# Patient Record
Sex: Female | Born: 1950
Health system: Southern US, Community
[De-identification: ages and names within clinical notes are randomized; demographics above are authoritative.]

## PROBLEM LIST (undated history)

## (undated) DIAGNOSIS — G930 Cerebral cysts: Secondary | ICD-10-CM

## (undated) DIAGNOSIS — E039 Hypothyroidism, unspecified: Secondary | ICD-10-CM

## (undated) DIAGNOSIS — R42 Dizziness and giddiness: Secondary | ICD-10-CM

## (undated) DIAGNOSIS — H532 Diplopia: Secondary | ICD-10-CM

## (undated) DIAGNOSIS — C4490 Unspecified malignant neoplasm of skin, unspecified: Secondary | ICD-10-CM

## (undated) DIAGNOSIS — Z8719 Personal history of other diseases of the digestive system: Secondary | ICD-10-CM

## (undated) DIAGNOSIS — T8859XA Other complications of anesthesia, initial encounter: Secondary | ICD-10-CM

## (undated) DIAGNOSIS — I10 Essential (primary) hypertension: Secondary | ICD-10-CM

## (undated) DIAGNOSIS — T7840XA Allergy, unspecified, initial encounter: Secondary | ICD-10-CM

## (undated) DIAGNOSIS — R51 Headache: Secondary | ICD-10-CM

## (undated) DIAGNOSIS — K219 Gastro-esophageal reflux disease without esophagitis: Secondary | ICD-10-CM

## (undated) DIAGNOSIS — M1990 Unspecified osteoarthritis, unspecified site: Secondary | ICD-10-CM

## (undated) DIAGNOSIS — C801 Malignant (primary) neoplasm, unspecified: Secondary | ICD-10-CM

## (undated) DIAGNOSIS — T4145XA Adverse effect of unspecified anesthetic, initial encounter: Secondary | ICD-10-CM

## (undated) DIAGNOSIS — E079 Disorder of thyroid, unspecified: Secondary | ICD-10-CM

## (undated) DIAGNOSIS — K635 Polyp of colon: Secondary | ICD-10-CM

## (undated) DIAGNOSIS — R519 Headache, unspecified: Secondary | ICD-10-CM

## (undated) DIAGNOSIS — C4491 Basal cell carcinoma of skin, unspecified: Secondary | ICD-10-CM

## (undated) DIAGNOSIS — K859 Acute pancreatitis without necrosis or infection, unspecified: Secondary | ICD-10-CM

## (undated) DIAGNOSIS — N809 Endometriosis, unspecified: Secondary | ICD-10-CM

## (undated) DIAGNOSIS — E785 Hyperlipidemia, unspecified: Secondary | ICD-10-CM

## (undated) HISTORY — DX: Hyperlipidemia, unspecified: E78.5

## (undated) HISTORY — DX: Personal history of other diseases of the digestive system: Z87.19

## (undated) HISTORY — DX: Disorder of thyroid, unspecified: E07.9

## (undated) HISTORY — PX: OOPHORECTOMY: SHX86

## (undated) HISTORY — DX: Unspecified malignant neoplasm of skin, unspecified: C44.90

## (undated) HISTORY — DX: Basal cell carcinoma of skin, unspecified: C44.91

## (undated) HISTORY — PX: COLONOSCOPY: SHX174

## (undated) HISTORY — DX: Gastro-esophageal reflux disease without esophagitis: K21.9

## (undated) HISTORY — DX: Essential (primary) hypertension: I10

## (undated) HISTORY — PX: DIAGNOSTIC LAPAROSCOPY: SUR761

## (undated) HISTORY — DX: Allergy, unspecified, initial encounter: T78.40XA

## (undated) HISTORY — DX: Diplopia: H53.2

## (undated) HISTORY — DX: Cerebral cysts: G93.0

## (undated) HISTORY — DX: Polyp of colon: K63.5

## (undated) HISTORY — DX: Acute pancreatitis without necrosis or infection, unspecified: K85.90

---

## 1999-11-10 ENCOUNTER — Encounter: Payer: Self-pay | Admitting: Internal Medicine

## 1999-11-10 ENCOUNTER — Encounter: Admission: RE | Admit: 1999-11-10 | Discharge: 1999-11-10 | Payer: Self-pay | Admitting: Internal Medicine

## 2000-09-28 ENCOUNTER — Other Ambulatory Visit: Admission: RE | Admit: 2000-09-28 | Discharge: 2000-09-28 | Payer: Self-pay | Admitting: Obstetrics & Gynecology

## 2001-10-24 ENCOUNTER — Other Ambulatory Visit: Admission: RE | Admit: 2001-10-24 | Discharge: 2001-10-24 | Payer: Self-pay | Admitting: Internal Medicine

## 2001-11-15 ENCOUNTER — Encounter: Payer: Self-pay | Admitting: Internal Medicine

## 2001-11-15 ENCOUNTER — Encounter: Admission: RE | Admit: 2001-11-15 | Discharge: 2001-11-15 | Payer: Self-pay | Admitting: Internal Medicine

## 2002-01-27 ENCOUNTER — Ambulatory Visit (HOSPITAL_COMMUNITY): Admission: RE | Admit: 2002-01-27 | Discharge: 2002-01-27 | Payer: Self-pay | Admitting: *Deleted

## 2002-01-27 ENCOUNTER — Encounter (INDEPENDENT_AMBULATORY_CARE_PROVIDER_SITE_OTHER): Payer: Self-pay

## 2004-01-13 ENCOUNTER — Other Ambulatory Visit: Admission: RE | Admit: 2004-01-13 | Discharge: 2004-01-13 | Payer: Self-pay | Admitting: Obstetrics & Gynecology

## 2004-04-29 ENCOUNTER — Encounter: Admission: RE | Admit: 2004-04-29 | Discharge: 2004-04-29 | Payer: Self-pay | Admitting: Internal Medicine

## 2005-05-08 ENCOUNTER — Other Ambulatory Visit: Admission: RE | Admit: 2005-05-08 | Discharge: 2005-05-08 | Payer: Self-pay | Admitting: Obstetrics & Gynecology

## 2005-09-27 ENCOUNTER — Encounter: Admission: RE | Admit: 2005-09-27 | Discharge: 2005-09-27 | Payer: Self-pay | Admitting: Internal Medicine

## 2006-05-22 ENCOUNTER — Encounter: Admission: RE | Admit: 2006-05-22 | Discharge: 2006-05-22 | Payer: Self-pay | Admitting: Obstetrics & Gynecology

## 2008-05-12 ENCOUNTER — Encounter: Admission: RE | Admit: 2008-05-12 | Discharge: 2008-05-12 | Payer: Self-pay | Admitting: Internal Medicine

## 2008-05-18 ENCOUNTER — Encounter: Admission: RE | Admit: 2008-05-18 | Discharge: 2008-05-18 | Payer: Self-pay | Admitting: Obstetrics & Gynecology

## 2009-05-26 ENCOUNTER — Encounter: Admission: RE | Admit: 2009-05-26 | Discharge: 2009-05-26 | Payer: Self-pay | Admitting: Internal Medicine

## 2010-04-21 ENCOUNTER — Ambulatory Visit (HOSPITAL_COMMUNITY)
Admission: RE | Admit: 2010-04-21 | Discharge: 2010-04-21 | Disposition: A | Discharge status: Home / Self Care | Payer: Managed Care, Other (non HMO) | Source: Ambulatory Visit | Attending: Internal Medicine | Admitting: Internal Medicine

## 2010-04-21 DIAGNOSIS — R079 Chest pain, unspecified: Secondary | ICD-10-CM | POA: Insufficient documentation

## 2010-06-20 ENCOUNTER — Other Ambulatory Visit: Payer: Self-pay | Admitting: Internal Medicine

## 2010-06-20 DIAGNOSIS — Z1231 Encounter for screening mammogram for malignant neoplasm of breast: Secondary | ICD-10-CM

## 2010-07-04 ENCOUNTER — Ambulatory Visit
Admission: RE | Admit: 2010-07-04 | Discharge: 2010-07-04 | Disposition: A | Discharge status: Home / Self Care | Payer: Managed Care, Other (non HMO) | Source: Ambulatory Visit | Attending: Internal Medicine | Admitting: Internal Medicine

## 2010-07-04 DIAGNOSIS — Z1231 Encounter for screening mammogram for malignant neoplasm of breast: Secondary | ICD-10-CM

## 2010-08-05 NOTE — Op Note (Signed)
   Abigail Robinson, SCHLAFER                         ACCOUNT NO.:  [id number]   MEDICAL RECORD NO.:  [id number]                   PATIENT TYPE:  AMB   LOCATION:  ENDO                                 FACILITY:  [facility]   PHYSICIAN:  Georgiana Spinner, M.D.                 DATE OF BIRTH:  Dec 11, 1950   DATE OF PROCEDURE:  DATE OF DISCHARGE:                                 OPERATIVE REPORT   PROCEDURE:  Upper endoscopy.   INDICATIONS FOR PROCEDURE:  Gastroesophageal reflux disease.  See previous  note. Patient seen as an outpatient.   ANESTHESIA:  Fentanyl 50 mcg, Versed 4 mg.   DESCRIPTION OF PROCEDURE:  With the patient mildly sedated in the left  lateral decubitus position, the Olympus videoscopic endoscope was inserted  in the mouth and passed under direct vision through the esophagus which  appeared normal until we reached the distal esophagus and there were clear  changes of esophagitis with ulceration and inflammatory changes and a  potentially early stricture formation photographed and biopsies taken. We  entered into the stomach. The fundus, body, antrum, duodenum bulb and second  portion of the duodenum all appeared normal. From this point, the endoscope  was slowly withdrawn taking circumferential views of the duodenal mucosa  until the endoscope was then pulled back out into the stomach, placed in  retroflexion to view the stomach from below and an incomplete wrap of the GE  junction around the endoscope and a hiatal hernia was seen and photographed.  The endoscope was then straightened and withdrawn taking circumferential  views of the remaining gastric and esophageal mucosa. The patient's vital  signs and pulse oximeter remained stable. The patient tolerated the  procedure well without apparent complications.   FINDINGS:  Hiatal hernia with esophagitis and early stricture formation.   PLAN:  Will discuss therapy with patient but should be on a PPI indefinitely  given these  findings. Will proceed to colonoscopy as planned.                                                Georgiana Spinner, M.D.    GMO/MEDQ  D:  01/27/2002  T:  01/27/2002  Job:  [id number]   cc:   Erskine Speed, M.D.  [address]  [place]  [place] [address]  Fax: [phone]

## 2010-08-05 NOTE — Op Note (Signed)
   NAMELADAWNA, Abigail Robinson                         ACCOUNT NO.:  [id number]   MEDICAL RECORD NO.:  [id number]                   PATIENT TYPE:  AMB   LOCATION:  ENDO                                 FACILITY:  [facility]   PHYSICIAN:  Georgiana Spinner, M.D.                 DATE OF BIRTH:  01/01/1951   DATE OF PROCEDURE:  DATE OF DISCHARGE:                                 OPERATIVE REPORT   PROCEDURE:  Colonoscopy.   INDICATIONS FOR PROCEDURE:  Rectal bleeding attributable to hemorrhoids in  the past. See attached clinical note.   ANESTHESIA:  Fentanyl 25 mcg, Versed 2 mg.   DESCRIPTION OF PROCEDURE:  With the patient mildly sedated in the left  lateral decubitus position, the Olympus videoscopic colonoscope was inserted  in the rectum and passed under direct vision to the cecum identified by the  ileocecal valve and appendiceal orifice both of which were photographed.  From this point, the colonoscope was slowly withdrawn taking circumferential  views of the entire colonic mucosa stopping only in the rectum which  appeared normal indirect and showed hemorrhoids on retroflexed view. The  endoscope was straightened and withdrawn. The patient's vital signs and  pulse oximeter remained stable. The patient tolerated the procedure well  without apparent complications.   FINDINGS:  Internal hemorrhoids, otherwise, unremarkable colonoscopic  examination.   PLAN:  See endoscopy note for further details.                                                 Georgiana Spinner, M.D.    GMO/MEDQ  D:  01/27/2002  T:  01/27/2002  Job:  [id number]   cc:   Erskine Speed, M.D.  [address], [address]  [place]  [place] [address]  Fax: [phone]

## 2011-01-26 ENCOUNTER — Ambulatory Visit
Admission: RE | Admit: 2011-01-26 | Discharge: 2011-01-26 | Disposition: A | Discharge status: Home / Self Care | Payer: Managed Care, Other (non HMO) | Source: Ambulatory Visit | Attending: Internal Medicine | Admitting: Internal Medicine

## 2011-01-26 ENCOUNTER — Other Ambulatory Visit: Payer: Self-pay | Admitting: Internal Medicine

## 2011-01-26 DIAGNOSIS — M79641 Pain in right hand: Secondary | ICD-10-CM

## 2011-05-29 ENCOUNTER — Other Ambulatory Visit: Payer: Self-pay | Admitting: Internal Medicine

## 2011-05-29 DIAGNOSIS — Z1231 Encounter for screening mammogram for malignant neoplasm of breast: Secondary | ICD-10-CM

## 2011-06-21 ENCOUNTER — Other Ambulatory Visit (HOSPITAL_COMMUNITY)
Admission: RE | Admit: 2011-06-21 | Discharge: 2011-06-21 | Disposition: A | Discharge status: Home / Self Care | Payer: Private Health Insurance - Indemnity | Source: Ambulatory Visit | Attending: Internal Medicine | Admitting: Internal Medicine

## 2011-06-21 DIAGNOSIS — Z01419 Encounter for gynecological examination (general) (routine) without abnormal findings: Secondary | ICD-10-CM | POA: Insufficient documentation

## 2011-07-28 ENCOUNTER — Ambulatory Visit
Admission: RE | Admit: 2011-07-28 | Discharge: 2011-07-28 | Disposition: A | Discharge status: Home / Self Care | Payer: Private Health Insurance - Indemnity | Source: Ambulatory Visit | Attending: Internal Medicine | Admitting: Internal Medicine

## 2011-07-28 DIAGNOSIS — Z1231 Encounter for screening mammogram for malignant neoplasm of breast: Secondary | ICD-10-CM

## 2011-08-03 ENCOUNTER — Other Ambulatory Visit: Payer: Self-pay | Admitting: Internal Medicine

## 2011-08-03 DIAGNOSIS — N6459 Other signs and symptoms in breast: Secondary | ICD-10-CM

## 2011-08-09 ENCOUNTER — Ambulatory Visit
Admission: RE | Admit: 2011-08-09 | Discharge: 2011-08-09 | Disposition: A | Discharge status: Home / Self Care | Payer: Private Health Insurance - Indemnity | Source: Ambulatory Visit | Attending: Internal Medicine | Admitting: Internal Medicine

## 2011-08-09 DIAGNOSIS — N6459 Other signs and symptoms in breast: Secondary | ICD-10-CM

## 2011-12-22 ENCOUNTER — Encounter: Payer: Self-pay | Admitting: Gastroenterology

## 2012-01-22 ENCOUNTER — Ambulatory Visit (AMBULATORY_SURGERY_CENTER): Payer: Private Health Insurance - Indemnity | Admitting: *Deleted

## 2012-01-22 VITALS — Ht 67.0 in | Wt 190.6 lb

## 2012-01-22 DIAGNOSIS — Z1211 Encounter for screening for malignant neoplasm of colon: Secondary | ICD-10-CM

## 2012-01-22 MED ORDER — MOVIPREP 100 G PO SOLR: ORAL | Status: DC

## 2012-01-26 ENCOUNTER — Encounter: Payer: Self-pay | Admitting: Gastroenterology

## 2012-02-02 ENCOUNTER — Ambulatory Visit (AMBULATORY_SURGERY_CENTER): Payer: Private Health Insurance - Indemnity | Admitting: Gastroenterology

## 2012-02-02 ENCOUNTER — Encounter: Payer: Self-pay | Admitting: Gastroenterology

## 2012-02-02 VITALS — BP 104/67 | HR 54 | Temp 98.0°F | Resp 13 | Ht 67.0 in | Wt 190.0 lb

## 2012-02-02 DIAGNOSIS — K5730 Diverticulosis of large intestine without perforation or abscess without bleeding: Secondary | ICD-10-CM

## 2012-02-02 DIAGNOSIS — D126 Benign neoplasm of colon, unspecified: Secondary | ICD-10-CM

## 2012-02-02 DIAGNOSIS — Z1211 Encounter for screening for malignant neoplasm of colon: Secondary | ICD-10-CM

## 2012-02-02 MED ORDER — SODIUM CHLORIDE 0.9 % IV SOLN: 500.0000 mL | INTRAVENOUS | Status: DC

## 2012-02-02 NOTE — Progress Notes (Addendum)
Patient did not have preoperative order for IV antibiotic SSI prophylaxis. (G8918)  Patient did not experience any of the following events: a burn prior to discharge; a fall within the facility; wrong site/side/patient/procedure/implant event; or a hospital transfer or hospital admission upon discharge from the facility. (G8907)  

## 2012-02-02 NOTE — Op Note (Signed)
[facility] [address] [place], [address]   COLONOSCOPY PROCEDURE REPORT  PATIENT: Keyna, Blizard  MR#: [id number] BIRTHDATE: 1950-09-26 , 61  yrs. old GENDER: Female ENDOSCOPIST: Rachael Fee, MD REFERRED GE:XBMWU Pomeroy Si, M.D. PROCEDURE DATE:  02/02/2012 PROCEDURE:   Colonoscopy with biopsy ASA CLASS:   Class III INDICATIONS:average risk screening, colonoscopy with Dr. Virginia Rochester 10 years ago, no polyps or cancers MEDICATIONS: Fentanyl 75 mcg IV, Versed 4 mg IV, and These medications were titrated to patient response per physician's verbal order  DESCRIPTION OF PROCEDURE:   After the risks benefits and alternatives of the procedure were thoroughly explained, informed consent was obtained.  A digital rectal exam revealed no abnormalities of the rectum.   The LB CF-[id number]  endoscope was introduced through the anus and advanced to the cecum, which was identified by both the appendix and ileocecal valve. No adverse events experienced.   The quality of the prep was good, using MoviPrep  The instrument was then slowly withdrawn as the colon was fully examined.  COLON FINDINGS: There was one small polyp that was removed and sent to pathology.  This was sessile, 2mm across, located in cecum, removed with biopsy forceps.  There was mild left sided diverticulosis.  The examination was otherwise normal.  Retroflexed views revealed no abnormalities. The time to cecum=2 minutes 41 seconds.  Withdrawal time=12 minutes 32 seconds.  The scope was withdrawn and the procedure completed. COMPLICATIONS: There were no complications.  ENDOSCOPIC IMPRESSION: There was one small polyp that was removed and sent to pathology. There was mild left sided diverticulosis. The examination was otherwise normal.  RECOMMENDATIONS: If the polyp(s) removed today are proven to be adenomatous (pre-cancerous) polyps, you will need a repeat colonoscopy in 5 years.  Otherwise you  should continue to follow colorectal cancer screening guidelines for "routine risk" patients with colonoscopy in 10 years.  You will receive a letter within 1-2 weeks with the results of your biopsy as well as final recommendations.  Please call my office if you have not received a letter after 3 weeks.   eSigned:  Rachael Fee, MD 02/02/2012 11:03 AM

## 2012-02-02 NOTE — Patient Instructions (Addendum)
YOU HAD AN ENDOSCOPIC PROCEDURE TODAY AT THE [facility]: Refer to the procedure report that was given to you for any specific questions about what was found during the examination.  If the procedure report does not answer your questions, please call your gastroenterologist to clarify.  If you requested that your care partner not be given the details of your procedure findings, then the procedure report has been included in a sealed envelope for you to review at your convenience later.  YOU SHOULD EXPECT: Some feelings of bloating in the abdomen. Passage of more gas than usual.  Walking can help get rid of the air that was put into your GI tract during the procedure and reduce the bloating. If you had a lower endoscopy (such as a colonoscopy or flexible sigmoidoscopy) you may notice spotting of blood in your stool or on the toilet paper. If you underwent a bowel prep for your procedure, then you may not have a normal bowel movement for a few days.  DIET: Your first meal following the procedure should be a light meal and then it is ok to progress to your normal diet.  A half-sandwich or bowl of soup is an example of a good first meal.  Heavy or fried foods are harder to digest and may make you feel nauseous or bloated.  Likewise meals heavy in dairy and vegetables can cause extra gas to form and this can also increase the bloating.  Drink plenty of fluids but you should avoid alcoholic beverages for 24 hours.  ACTIVITY: Your care partner should take you home directly after the procedure.  You should plan to take it easy, moving slowly for the rest of the day.  You can resume normal activity the day after the procedure however you should NOT DRIVE or use heavy machinery for 24 hours (because of the sedation medicines used during the test).    SYMPTOMS TO REPORT IMMEDIATELY: A gastroenterologist can be reached at any hour.  During normal business hours, 8:30 AM to 5:00 PM Monday through Friday,  call [phone].  After hours and on weekends, please call the GI answering service at [phone] who will take a message and have the physician on call contact you.   Following lower endoscopy (colonoscopy or flexible sigmoidoscopy):  Excessive amounts of blood in the stool  Significant tenderness or worsening of abdominal pains  Swelling of the abdomen that is new, acute  Fever of 100F or higher  FOLLOW UP: If any biopsies were taken you will be contacted by phone or by letter within the next 1-3 weeks.  Call your gastroenterologist if you have not heard about the biopsies in 3 weeks.  Our staff will call the home number listed on your records the next business day following your procedure to check on you and address any questions or concerns that you may have at that time regarding the information given to you following your procedure. This is a courtesy call and so if there is no answer at the home number and we have not heard from you through the emergency physician on call, we will assume that you have returned to your regular daily activities without incident.  SIGNATURES/CONFIDENTIALITY: You and/or your care partner have signed paperwork which will be entered into your electronic medical record.  These signatures attest to the fact that that the information above on your After Visit Summary has been reviewed and is understood.  Full responsibility of the confidentiality of this   discharge information lies with you and/or your care-partner.   Thank-you for choosing us for your healthcare needs. 

## 2012-02-05 ENCOUNTER — Telehealth: Payer: Self-pay | Admitting: *Deleted

## 2012-02-05 NOTE — Telephone Encounter (Signed)
  Follow up Call-  Call back number 02/02/2012  Post procedure Call Back phone  # [phone]  Permission to leave phone message Yes     Patient questions:  Do you have a fever, pain , or abdominal swelling? no Pain Score  0 *  Have you tolerated food without any problems? yes  Have you been able to return to your normal activities? yes  Do you have any questions about your discharge instructions: Diet   no Medications  no Follow up visit  no  Do you have questions or concerns about your Care? no  Actions: * If pain score is 4 or above: No action needed, pain <4.

## 2012-02-13 ENCOUNTER — Encounter: Payer: Self-pay | Admitting: Gastroenterology

## 2012-10-03 ENCOUNTER — Other Ambulatory Visit: Payer: Self-pay

## 2012-10-03 DIAGNOSIS — Z1231 Encounter for screening mammogram for malignant neoplasm of breast: Secondary | ICD-10-CM

## 2012-11-04 ENCOUNTER — Ambulatory Visit
Admission: RE | Admit: 2012-11-04 | Discharge: 2012-11-04 | Disposition: A | Discharge status: Home / Self Care | Payer: BC Managed Care – PPO | Source: Ambulatory Visit

## 2012-11-04 DIAGNOSIS — Z1231 Encounter for screening mammogram for malignant neoplasm of breast: Secondary | ICD-10-CM

## 2013-11-25 ENCOUNTER — Other Ambulatory Visit: Payer: Self-pay

## 2013-11-25 DIAGNOSIS — Z1231 Encounter for screening mammogram for malignant neoplasm of breast: Secondary | ICD-10-CM

## 2013-12-02 ENCOUNTER — Ambulatory Visit
Admission: RE | Admit: 2013-12-02 | Discharge: 2013-12-02 | Disposition: A | Discharge status: Home / Self Care | Payer: BC Managed Care – PPO | Source: Ambulatory Visit

## 2013-12-02 DIAGNOSIS — Z1231 Encounter for screening mammogram for malignant neoplasm of breast: Secondary | ICD-10-CM

## 2014-07-06 DIAGNOSIS — I519 Heart disease, unspecified: Secondary | ICD-10-CM | POA: Diagnosis not present

## 2014-07-06 DIAGNOSIS — E039 Hypothyroidism, unspecified: Secondary | ICD-10-CM | POA: Diagnosis not present

## 2014-07-06 DIAGNOSIS — M1990 Unspecified osteoarthritis, unspecified site: Secondary | ICD-10-CM | POA: Diagnosis not present

## 2014-07-06 DIAGNOSIS — M549 Dorsalgia, unspecified: Secondary | ICD-10-CM | POA: Diagnosis not present

## 2014-07-06 DIAGNOSIS — Z0000 Encounter for general adult medical examination without abnormal findings: Secondary | ICD-10-CM | POA: Diagnosis not present

## 2014-07-09 ENCOUNTER — Ambulatory Visit
Admission: RE | Admit: 2014-07-09 | Discharge: 2014-07-09 | Disposition: A | Discharge status: Home / Self Care | Payer: Medicare Other | Source: Ambulatory Visit | Attending: Internal Medicine | Admitting: Internal Medicine

## 2014-07-09 ENCOUNTER — Other Ambulatory Visit: Payer: Self-pay | Admitting: Internal Medicine

## 2014-07-09 DIAGNOSIS — R05 Cough: Secondary | ICD-10-CM

## 2014-07-09 DIAGNOSIS — R058 Other specified cough: Secondary | ICD-10-CM

## 2014-08-04 DIAGNOSIS — C44519 Basal cell carcinoma of skin of other part of trunk: Secondary | ICD-10-CM | POA: Diagnosis not present

## 2014-08-20 ENCOUNTER — Other Ambulatory Visit: Payer: Self-pay | Admitting: Obstetrics and Gynecology

## 2014-08-20 DIAGNOSIS — Z124 Encounter for screening for malignant neoplasm of cervix: Secondary | ICD-10-CM | POA: Diagnosis not present

## 2014-08-20 DIAGNOSIS — N958 Other specified menopausal and perimenopausal disorders: Secondary | ICD-10-CM | POA: Diagnosis not present

## 2014-08-20 DIAGNOSIS — M8588 Other specified disorders of bone density and structure, other site: Secondary | ICD-10-CM | POA: Diagnosis not present

## 2014-08-21 LAB — CYTOLOGY - PAP

## 2014-09-02 DIAGNOSIS — C44519 Basal cell carcinoma of skin of other part of trunk: Secondary | ICD-10-CM | POA: Diagnosis not present

## 2014-12-10 ENCOUNTER — Other Ambulatory Visit: Payer: Self-pay

## 2014-12-10 DIAGNOSIS — Z1231 Encounter for screening mammogram for malignant neoplasm of breast: Secondary | ICD-10-CM

## 2014-12-17 DIAGNOSIS — C44519 Basal cell carcinoma of skin of other part of trunk: Secondary | ICD-10-CM | POA: Diagnosis not present

## 2014-12-17 DIAGNOSIS — I10 Essential (primary) hypertension: Secondary | ICD-10-CM | POA: Diagnosis not present

## 2014-12-17 DIAGNOSIS — Z23 Encounter for immunization: Secondary | ICD-10-CM | POA: Diagnosis not present

## 2015-01-13 ENCOUNTER — Ambulatory Visit: Payer: Medicare Other

## 2015-02-04 ENCOUNTER — Ambulatory Visit
Admission: RE | Admit: 2015-02-04 | Discharge: 2015-02-04 | Disposition: A | Discharge status: Home / Self Care | Payer: Medicare Other | Source: Ambulatory Visit

## 2015-02-04 DIAGNOSIS — Z1231 Encounter for screening mammogram for malignant neoplasm of breast: Secondary | ICD-10-CM | POA: Diagnosis not present

## 2015-02-04 NOTE — H&P (Signed)
  S:  Abigail Robinson presents today for a new GYN evaluation.  Previously seen by Dr. Nori Riis.  Last seen in 2010.  She is followed by her primary care doctor, Dr. Levin Erp.  She has been complaining of uterine prolapse symptoms.  Every time she tries to exercise or stand, she feels like things are falling out.  Has urgency type of symptoms.  No changes with the bowel movements.  No vaginal bleeding.  She is occasionally sexually active.  Husband has prostate issue so it is not a huge concern to her.  Denies any urinary incontinence.  O:  Physical exam:  General:  Alert and oriented.  Normocephalic and atraumatic.  No thyromegaly or masses palpated.  Heart is regular rate and rhythm without murmur.  Lungs are clear to auscultation bilater ally.  Breasts without masses, lymphadenopathy or discharge.  Abdomen is soft, nontender, nondistended.  No rebound or guarding.  No flank pain is noted.  Extremities without clubbing, cyanosis or edema.  Normal external genitalia, Bartholin, Skene's and urethra.  Vagina without lesions.  Cervix within normal limits.  Uterus is anteverted, mobile and nontender.  No adnexal masses are palpable.  No inguinal lymphadenopathy palpable.  She does have a third-degree cystocele, third-degree rectocele and uterine prolapse with the cervix at the introitus.   A/P:  1) Well-woman exam.  Pap smear yearly.  Self-breast exam once a month.  Mammogram yearly.  2) Uterine prolapse with cystocele and rectocele.  Patient is symptomatic and does want surgical treatment.  Discussed the options.  Would recommended LAVH, BSO, A&P repair with sacrospinous ligament suspension.  Discussed the procedure with her.  She does want to  schedule this.     Louretta Shorten, MD/[id number]

## 2015-02-10 ENCOUNTER — Inpatient Hospital Stay (HOSPITAL_COMMUNITY)
Admission: RE | Admit: 2015-02-10 | Discharge: 2015-02-10 | Disposition: A | Discharge status: Home / Self Care | Payer: Medicare Other | Source: Ambulatory Visit

## 2015-02-15 ENCOUNTER — Encounter (HOSPITAL_COMMUNITY): Admission: RE | Payer: Self-pay | Source: Ambulatory Visit

## 2015-02-15 ENCOUNTER — Inpatient Hospital Stay (HOSPITAL_COMMUNITY)
Admission: RE | Admit: 2015-02-15 | Payer: Medicare Other | Source: Ambulatory Visit | Admitting: Obstetrics and Gynecology

## 2015-02-15 SURGERY — HYSTERECTOMY, VAGINAL, LAPAROSCOPY-ASSISTED
Anesthesia: General

## 2015-05-13 DIAGNOSIS — Z6829 Body mass index (BMI) 29.0-29.9, adult: Secondary | ICD-10-CM | POA: Diagnosis not present

## 2015-05-13 DIAGNOSIS — K219 Gastro-esophageal reflux disease without esophagitis: Secondary | ICD-10-CM | POA: Diagnosis present

## 2015-05-13 DIAGNOSIS — M479 Spondylosis, unspecified: Secondary | ICD-10-CM | POA: Diagnosis not present

## 2015-05-13 DIAGNOSIS — I10 Essential (primary) hypertension: Secondary | ICD-10-CM | POA: Diagnosis not present

## 2015-05-13 DIAGNOSIS — E039 Hypothyroidism, unspecified: Secondary | ICD-10-CM | POA: Diagnosis present

## 2015-05-13 DIAGNOSIS — R32 Unspecified urinary incontinence: Secondary | ICD-10-CM | POA: Diagnosis not present

## 2015-05-13 DIAGNOSIS — Z1389 Encounter for screening for other disorder: Secondary | ICD-10-CM | POA: Diagnosis not present

## 2015-05-13 DIAGNOSIS — J302 Other seasonal allergic rhinitis: Secondary | ICD-10-CM | POA: Diagnosis not present

## 2015-05-13 DIAGNOSIS — N814 Uterovaginal prolapse, unspecified: Secondary | ICD-10-CM | POA: Diagnosis not present

## 2015-05-13 DIAGNOSIS — E038 Other specified hypothyroidism: Secondary | ICD-10-CM | POA: Diagnosis not present

## 2015-06-29 NOTE — Patient Instructions (Addendum)
Your procedure is scheduled on:  Thursday, July 08, 2015  Enter through the Main Entrance of [facility] at: 6:00 AM  Pick up the phone at the desk and dial [phone].  Call this number if you have problems the morning of surgery: [phone].  Remember:  Do NOT eat food or drink after:  Midnight Wednesday  Take these medicines the morning of surgery with a SIP OF WATER:  Atenolol, Losartan, Omperazole  Do NOT wear jewelry (body piercing), metal hair clips/bobby pins, make-up, or nail polish. Do NOT wear lotions, powders, or perfumes.  You may wear deodorant. Do NOT shave for 48 hours prior to surgery. Do NOT bring valuables to the hospital. Contacts, dentures, or bridgework may not be worn into surgery.  Leave suitcase in car.  After surgery it may be brought to your room.  For patients admitted to the hospital, checkout time is 11:00 AM the day of discharge.

## 2015-07-01 ENCOUNTER — Encounter (HOSPITAL_COMMUNITY)
Admission: RE | Admit: 2015-07-01 | Discharge: 2015-07-01 | Disposition: A | Discharge status: Home / Self Care | Payer: Medicare Other | Source: Ambulatory Visit | Attending: Obstetrics and Gynecology | Admitting: Obstetrics and Gynecology

## 2015-07-01 ENCOUNTER — Encounter (HOSPITAL_COMMUNITY): Payer: Self-pay

## 2015-07-01 ENCOUNTER — Other Ambulatory Visit: Payer: Self-pay

## 2015-07-01 DIAGNOSIS — Z01818 Encounter for other preprocedural examination: Secondary | ICD-10-CM | POA: Insufficient documentation

## 2015-07-01 DIAGNOSIS — N812 Incomplete uterovaginal prolapse: Secondary | ICD-10-CM | POA: Diagnosis not present

## 2015-07-01 HISTORY — DX: Other complications of anesthesia, initial encounter: T88.59XA

## 2015-07-01 HISTORY — DX: Hypothyroidism, unspecified: E03.9

## 2015-07-01 HISTORY — DX: Headache: R51

## 2015-07-01 HISTORY — DX: Endometriosis, unspecified: N80.9

## 2015-07-01 HISTORY — DX: Adverse effect of unspecified anesthetic, initial encounter: T41.45XA

## 2015-07-01 HISTORY — DX: Headache, unspecified: R51.9

## 2015-07-01 HISTORY — DX: Unspecified osteoarthritis, unspecified site: M19.90

## 2015-07-01 HISTORY — DX: Dizziness and giddiness: R42

## 2015-07-01 HISTORY — DX: Malignant (primary) neoplasm, unspecified: C80.1

## 2015-07-01 LAB — CBC
HEMATOCRIT: 39.9 % (ref 36.0–46.0)
Hemoglobin: 13.8 g/dL (ref 12.0–15.0)
MCH: 31.4 pg (ref 26.0–34.0)
MCHC: 34.6 g/dL (ref 30.0–36.0)
MCV: 90.7 fL (ref 78.0–100.0)
Platelets: 290 10*3/uL (ref 150–400)
RBC: 4.4 MIL/uL (ref 3.87–5.11)
RDW: 13.5 % (ref 11.5–15.5)
WBC: 6.9 10*3/uL (ref 4.0–10.5)

## 2015-07-01 LAB — BASIC METABOLIC PANEL
Anion gap: 4 — ABNORMAL LOW (ref 5–15)
BUN: 20 mg/dL (ref 6–20)
CALCIUM: 9.7 mg/dL (ref 8.9–10.3)
CO2: 27 mmol/L (ref 22–32)
CREATININE: 0.97 mg/dL (ref 0.44–1.00)
Chloride: 107 mmol/L (ref 101–111)
GFR calc Af Amer: >60 mL/min (ref >60)
GFR calc non Af Amer: 60 mL/min — ABNORMAL LOW (ref >60)
GLUCOSE: 98 mg/dL (ref 65–99)
Potassium: 4.8 mmol/L (ref 3.5–5.1)
Sodium: 138 mmol/L (ref 135–145)

## 2015-07-07 MED ORDER — DEXTROSE 5 % IV SOLN
2.0000 g | INTRAVENOUS | Status: AC
  Administered 2015-07-08: 2 g via INTRAVENOUS
  Filled 2015-07-07: qty 2

## 2015-07-07 NOTE — H&P (Addendum)
S:  Abigail Robinson presents today for a new GYN evaluation.  Previously seen by Dr. Nori Riis.  Last seen in 2010.  She is followed by her primary care doctor, Dr. Levin Erp.  She has been complaining of uterine prolapse symptoms.  Every time she tries to exercise or stand, she feels like things are falling out.  Has urgency type of symptoms.  No changes with the bowel movements.  No vaginal bleeding.  She is occasionally sexually active.  Husband has prostate issue so it is not a huge concern to her.  Denies any urinary incontinence.  O:  Physical exam:  General:  Alert and oriented.  Normocephalic and atraumatic.  No thyromegaly or masses palpated.  Heart is regular rate and rhythm without murmur.  Lungs are clear to auscultation bilater ally.  Breasts without masses, lymphadenopathy or discharge.  Abdomen is soft, nontender, nondistended.  No rebound or guarding.  No flank pain is noted.  Extremities without clubbing, cyanosis or edema.  Normal external genitalia, Bartholin, Skene's and urethra.  Vagina without lesions.  Cervix within normal limits.  Uterus is anteverted, mobile and nontender.  No adnexal masses are palpable.  No inguinal lymphadenopathy palpable.  She does have a third-degree cystocele, third-degree rectocele and uterine prolapse with the cervix at the introitus.   A/P:  1) Well-woman exam.  Pap smear yearly.  Self-breast exam once a month.  Mammogram yearly.  2) Uterine prolapse with cystocele and rectocele.  Patient is symptomatic and does want surgical treatment.  Discussed the options.  Would recommended LAVH, BSO, A&P repair with sacrospinous ligament suspension.  Discussed the procedure with her.  She does want to  schedule this.     Abigail Shorten, MD/[id number] Abigail Robinson [id number] 07/01/2015 S:  Abigail Robinson presents today for a preop evaluation for a hysterectomy.  Abigail Robinson has been having worsening problems with uterine prolapse, cystocele and rectocele with urinary urgency to a point that when she  stands or lifts, it prolapses through the cervix.  She was scheduled to have surgery earlier this year but husband recently had cancer, and he is now at a point where they can proceed.  Physical exam, I reviewed my previous exam which you can see through the previous notes.  Reviewed the procedures, its risks, its benefits, it pros, its cons, the surgery, the risks and benefits and discussed her medication.  She has allergy to Demerol but no other allergies.  All of her questions are answered.  We are going to proceed with LAVH, BSO, A&P repair with sacrospinous suspension.  Again, all of her questions were answered by her and her husband.  I spent 45 minutes today with her husband discussing through and reviewing the procedure and previous exams.   Abigail Shorten, MD/[id number]  07/08/15 0715 This patient has been seen and examined.   All of her questions were answered.  Labs and vital signs reviewed.  Informed consent has been obtained.  The History and Physical is current. DL

## 2015-07-07 NOTE — Anesthesia Preprocedure Evaluation (Addendum)
Anesthesia Evaluation  Patient identified by MRN, date of birth, ID band Patient awake    Reviewed: Allergy & Precautions, NPO status , Patient's Chart, lab work & pertinent test results, reviewed documented beta blocker date and time   Airway Mallampati: II  TM Distance: >3 FB Neck ROM: Full    Dental  (+) Teeth Intact   Pulmonary    breath sounds clear to auscultation       Cardiovascular hypertension, Pt. on medications and Pt. on home beta blockers  Rhythm:Regular Rate:Normal     Neuro/Psych  Headaches, negative psych ROS   GI/Hepatic Neg liver ROS, GERD  Medicated,  Endo/Other  Hypothyroidism   Renal/GU negative Renal ROS  negative genitourinary   Musculoskeletal  (+) Arthritis , Osteoarthritis,    Abdominal   Peds negative pediatric ROS (+)  Hematology   Anesthesia Other Findings   Reproductive/Obstetrics negative OB ROS                           Lab Results  Component Value Date   WBC 6.9 07/01/2015   HGB 13.8 07/01/2015   HCT 39.9 07/01/2015   MCV 90.7 07/01/2015   PLT 290 07/01/2015   Lab Results  Component Value Date   CREATININE 0.97 07/01/2015   BUN 20 07/01/2015   NA 138 07/01/2015   K 4.8 07/01/2015   CL 107 07/01/2015   CO2 27 07/01/2015   No results found for: INR, PROTIME  06/2015 EKG: sinus bradycardia.   Anesthesia Physical Anesthesia Plan  ASA: II  Anesthesia Plan: General   Post-op Pain Management:    Induction: Intravenous  Airway Management Planned: Oral ETT  Additional Equipment:   Intra-op Plan:   Post-operative Plan: Extubation in OR  Informed Consent: I have reviewed the patients History and Physical, chart, labs and discussed the procedure including the risks, benefits and alternatives for the proposed anesthesia with the patient or authorized representative who has indicated his/her understanding and acceptance.   Dental advisory  given  Plan Discussed with: CRNA  Anesthesia Plan Comments: (Ms. Stoos did not take her beta blocker this morning. We will administer IV Metoprolol in the perioperative setting.  )       Anesthesia Quick Evaluation

## 2015-07-08 ENCOUNTER — Inpatient Hospital Stay (HOSPITAL_COMMUNITY): Payer: Medicare Other | Admitting: Anesthesiology

## 2015-07-08 ENCOUNTER — Encounter (HOSPITAL_COMMUNITY)
Admission: RE | Disposition: A | Discharge status: Home / Self Care | Payer: Self-pay | Source: Ambulatory Visit | Attending: Obstetrics and Gynecology

## 2015-07-08 ENCOUNTER — Encounter (HOSPITAL_COMMUNITY): Payer: Self-pay | Admitting: Emergency Medicine

## 2015-07-08 ENCOUNTER — Inpatient Hospital Stay (HOSPITAL_COMMUNITY)
Admission: RE | Admit: 2015-07-08 | Discharge: 2015-07-10 | DRG: 743 | Disposition: A | Discharge status: Home / Self Care | Payer: Medicare Other | Source: Ambulatory Visit | Attending: Obstetrics and Gynecology | Admitting: Obstetrics and Gynecology

## 2015-07-08 DIAGNOSIS — N813 Complete uterovaginal prolapse: Principal | ICD-10-CM | POA: Diagnosis present

## 2015-07-08 DIAGNOSIS — K219 Gastro-esophageal reflux disease without esophagitis: Secondary | ICD-10-CM | POA: Diagnosis present

## 2015-07-08 DIAGNOSIS — I10 Essential (primary) hypertension: Secondary | ICD-10-CM | POA: Diagnosis present

## 2015-07-08 DIAGNOSIS — Z90710 Acquired absence of both cervix and uterus: Secondary | ICD-10-CM | POA: Diagnosis present

## 2015-07-08 DIAGNOSIS — Z791 Long term (current) use of non-steroidal anti-inflammatories (NSAID): Secondary | ICD-10-CM | POA: Diagnosis not present

## 2015-07-08 DIAGNOSIS — N803 Endometriosis of pelvic peritoneum: Secondary | ICD-10-CM | POA: Diagnosis present

## 2015-07-08 DIAGNOSIS — Z79899 Other long term (current) drug therapy: Secondary | ICD-10-CM

## 2015-07-08 DIAGNOSIS — N800 Endometriosis of uterus: Secondary | ICD-10-CM | POA: Diagnosis not present

## 2015-07-08 DIAGNOSIS — M1990 Unspecified osteoarthritis, unspecified site: Secondary | ICD-10-CM | POA: Diagnosis not present

## 2015-07-08 DIAGNOSIS — N816 Rectocele: Secondary | ICD-10-CM | POA: Diagnosis not present

## 2015-07-08 DIAGNOSIS — D259 Leiomyoma of uterus, unspecified: Secondary | ICD-10-CM | POA: Diagnosis not present

## 2015-07-08 DIAGNOSIS — E039 Hypothyroidism, unspecified: Secondary | ICD-10-CM | POA: Diagnosis present

## 2015-07-08 DIAGNOSIS — N814 Uterovaginal prolapse, unspecified: Secondary | ICD-10-CM | POA: Diagnosis not present

## 2015-07-08 HISTORY — PX: LAPAROSCOPIC VAGINAL HYSTERECTOMY WITH SALPINGO OOPHORECTOMY: SHX6681

## 2015-07-08 HISTORY — PX: ANTERIOR AND POSTERIOR REPAIR WITH SACROSPINOUS FIXATION: SHX6536

## 2015-07-08 SURGERY — HYSTERECTOMY, VAGINAL, LAPAROSCOPY-ASSISTED, WITH SALPINGO-OOPHORECTOMY
Anesthesia: General | Site: Vagina

## 2015-07-08 MED ORDER — DEXTROSE-NACL 5-0.45 % IV SOLN
INTRAVENOUS | Status: DC
  Administered 2015-07-08 – 2015-07-09 (×3): via INTRAVENOUS

## 2015-07-08 MED ORDER — NEOSTIGMINE METHYLSULFATE 10 MG/10ML IV SOLN
INTRAVENOUS | Status: DC | PRN
Start: 2015-07-08 — End: 2015-07-08
  Administered 2015-07-08: 4 mg via INTRAVENOUS

## 2015-07-08 MED ORDER — MIDAZOLAM HCL 2 MG/2ML IJ SOLN
INTRAMUSCULAR | Status: AC
  Filled 2015-07-08: qty 2

## 2015-07-08 MED ORDER — ESTRADIOL 0.1 MG/GM VA CREA
TOPICAL_CREAM | VAGINAL | Status: DC | PRN
  Administered 2015-07-08: 1 via VAGINAL

## 2015-07-08 MED ORDER — HYDROMORPHONE HCL 1 MG/ML IJ SOLN: 0.2000 mg | INTRAMUSCULAR | Status: DC | PRN

## 2015-07-08 MED ORDER — FENTANYL CITRATE (PF) 250 MCG/5ML IJ SOLN
INTRAMUSCULAR | Status: DC | PRN
  Administered 2015-07-08 (×3): 50 ug via INTRAVENOUS

## 2015-07-08 MED ORDER — GLYCOPYRROLATE 0.2 MG/ML IJ SOLN
INTRAMUSCULAR | Status: DC | PRN
  Administered 2015-07-08: .8 mg via INTRAVENOUS
  Administered 2015-07-08: 0.1 mg via INTRAVENOUS

## 2015-07-08 MED ORDER — BUPIVACAINE-EPINEPHRINE (PF) 0.25% -1:200000 IJ SOLN
INTRAMUSCULAR | Status: AC
  Filled 2015-07-08: qty 30

## 2015-07-08 MED ORDER — ROCURONIUM BROMIDE 100 MG/10ML IV SOLN
INTRAVENOUS | Status: DC | PRN
  Administered 2015-07-08: 40 mg via INTRAVENOUS
  Administered 2015-07-08: 10 mg via INTRAVENOUS

## 2015-07-08 MED ORDER — HYDROMORPHONE HCL 1 MG/ML IJ SOLN
0.2500 mg | INTRAMUSCULAR | Status: DC | PRN
  Administered 2015-07-08: 0.5 mg via INTRAVENOUS

## 2015-07-08 MED ORDER — DIPHENHYDRAMINE HCL 12.5 MG/5ML PO ELIX: 12.5000 mg | ORAL_SOLUTION | Freq: Four times a day (QID) | ORAL | Status: DC | PRN

## 2015-07-08 MED ORDER — ROCURONIUM BROMIDE 100 MG/10ML IV SOLN
INTRAVENOUS | Status: AC
  Filled 2015-07-08: qty 1

## 2015-07-08 MED ORDER — HYDROMORPHONE HCL 1 MG/ML IJ SOLN
INTRAMUSCULAR | Status: AC
  Administered 2015-07-08: 0.5 mg via INTRAVENOUS
  Filled 2015-07-08: qty 1

## 2015-07-08 MED ORDER — ONDANSETRON HCL 4 MG/2ML IJ SOLN
4.0000 mg | Freq: Four times a day (QID) | INTRAMUSCULAR | Status: DC | PRN
  Administered 2015-07-08: 4 mg via INTRAVENOUS
  Filled 2015-07-08: qty 2

## 2015-07-08 MED ORDER — NALOXONE HCL 0.4 MG/ML IJ SOLN: 0.4000 mg | INTRAMUSCULAR | Status: DC | PRN

## 2015-07-08 MED ORDER — FENTANYL CITRATE (PF) 250 MCG/5ML IJ SOLN
INTRAMUSCULAR | Status: AC
  Filled 2015-07-08: qty 5

## 2015-07-08 MED ORDER — ONDANSETRON HCL 4 MG/2ML IJ SOLN
INTRAMUSCULAR | Status: DC | PRN
  Administered 2015-07-08: 4 mg via INTRAVENOUS

## 2015-07-08 MED ORDER — LIDOCAINE HCL (CARDIAC) 20 MG/ML IV SOLN
INTRAVENOUS | Status: AC
  Filled 2015-07-08: qty 5

## 2015-07-08 MED ORDER — FAMOTIDINE 20 MG PO TABS
20.0000 mg | ORAL_TABLET | Freq: Once | ORAL | Status: AC
  Administered 2015-07-08: 20 mg via ORAL

## 2015-07-08 MED ORDER — ONDANSETRON HCL 4 MG/2ML IJ SOLN
INTRAMUSCULAR | Status: AC
  Filled 2015-07-08: qty 2

## 2015-07-08 MED ORDER — DIPHENHYDRAMINE HCL 50 MG/ML IJ SOLN: 12.5000 mg | Freq: Four times a day (QID) | INTRAMUSCULAR | Status: DC | PRN

## 2015-07-08 MED ORDER — PROPOFOL 10 MG/ML IV BOLUS
INTRAVENOUS | Status: DC | PRN
  Administered 2015-07-08: 130 mg via INTRAVENOUS

## 2015-07-08 MED ORDER — IBUPROFEN 600 MG PO TABS
600.0000 mg | ORAL_TABLET | Freq: Four times a day (QID) | ORAL | Status: DC | PRN
  Administered 2015-07-09 – 2015-07-10 (×5): 600 mg via ORAL
  Filled 2015-07-08 (×5): qty 1

## 2015-07-08 MED ORDER — SCOPOLAMINE 1 MG/3DAYS TD PT72: 1.0000 | MEDICATED_PATCH | Freq: Once | TRANSDERMAL | Status: DC

## 2015-07-08 MED ORDER — HYDROMORPHONE 1 MG/ML IV SOLN
INTRAVENOUS | Status: DC
  Administered 2015-07-08: 11:00:00 via INTRAVENOUS
  Administered 2015-07-08: 0.2 mg via INTRAVENOUS
  Administered 2015-07-08: 0.6 mg via INTRAVENOUS
  Administered 2015-07-08: 0.2 mg via INTRAVENOUS
  Administered 2015-07-09 (×2): 0.4 mg via INTRAVENOUS
  Filled 2015-07-08: qty 25

## 2015-07-08 MED ORDER — LACTATED RINGERS IV SOLN: INTRAVENOUS | Status: DC

## 2015-07-08 MED ORDER — DEXAMETHASONE SODIUM PHOSPHATE 10 MG/ML IJ SOLN
INTRAMUSCULAR | Status: AC
  Filled 2015-07-08: qty 1

## 2015-07-08 MED ORDER — BUPIVACAINE HCL (PF) 0.25 % IJ SOLN
INTRAMUSCULAR | Status: DC | PRN
  Administered 2015-07-08: 10 mL

## 2015-07-08 MED ORDER — SODIUM CHLORIDE 0.9% FLUSH: 9.0000 mL | INTRAVENOUS | Status: DC | PRN

## 2015-07-08 MED ORDER — LIDOCAINE HCL (CARDIAC) 20 MG/ML IV SOLN
INTRAVENOUS | Status: DC | PRN
  Administered 2015-07-08: 60 mg via INTRAVENOUS

## 2015-07-08 MED ORDER — DEXAMETHASONE SODIUM PHOSPHATE 4 MG/ML IJ SOLN
INTRAMUSCULAR | Status: DC | PRN
  Administered 2015-07-08: 10 mg via INTRAVENOUS

## 2015-07-08 MED ORDER — NEOSTIGMINE METHYLSULFATE 10 MG/10ML IV SOLN
INTRAVENOUS | Status: AC
  Filled 2015-07-08: qty 1

## 2015-07-08 MED ORDER — LACTATED RINGERS IR SOLN
Status: DC | PRN
  Administered 2015-07-08: 3000 mL

## 2015-07-08 MED ORDER — HYDROMORPHONE HCL 1 MG/ML IJ SOLN
INTRAMUSCULAR | Status: AC
  Filled 2015-07-08: qty 1

## 2015-07-08 MED ORDER — MENTHOL 3 MG MT LOZG: 1.0000 | LOZENGE | OROMUCOSAL | Status: DC | PRN

## 2015-07-08 MED ORDER — OXYCODONE-ACETAMINOPHEN 5-325 MG PO TABS: 1.0000 | ORAL_TABLET | ORAL | Status: DC | PRN

## 2015-07-08 MED ORDER — PROPOFOL 10 MG/ML IV BOLUS
INTRAVENOUS | Status: AC
  Filled 2015-07-08: qty 20

## 2015-07-08 MED ORDER — BUPIVACAINE HCL (PF) 0.25 % IJ SOLN
INTRAMUSCULAR | Status: AC
  Filled 2015-07-08: qty 30

## 2015-07-08 MED ORDER — ESTRADIOL 0.1 MG/GM VA CREA
TOPICAL_CREAM | VAGINAL | Status: AC
  Filled 2015-07-08: qty 42.5

## 2015-07-08 MED ORDER — PROCHLORPERAZINE EDISYLATE 5 MG/ML IJ SOLN
10.0000 mg | INTRAMUSCULAR | Status: DC | PRN
  Filled 2015-07-08: qty 2

## 2015-07-08 MED ORDER — HYDROMORPHONE HCL 1 MG/ML IJ SOLN
INTRAMUSCULAR | Status: DC | PRN
  Administered 2015-07-08 (×2): 0.5 mg via INTRAVENOUS

## 2015-07-08 MED ORDER — LACTATED RINGERS IV SOLN
INTRAVENOUS | Status: DC
  Administered 2015-07-08 (×3): via INTRAVENOUS

## 2015-07-08 MED ORDER — MIDAZOLAM HCL 2 MG/2ML IJ SOLN
INTRAMUSCULAR | Status: DC | PRN
  Administered 2015-07-08: 1 mg via INTRAVENOUS

## 2015-07-08 MED ORDER — FAMOTIDINE 20 MG PO TABS
ORAL_TABLET | ORAL | Status: AC
  Administered 2015-07-08: 20 mg via ORAL
  Filled 2015-07-08: qty 1

## 2015-07-08 SURGICAL SUPPLY — 60 items
BLADE SURG 15 STRL LF C SS BP (BLADE) ×2 IMPLANT
BLADE SURG 15 STRL SS (BLADE) ×4
CABLE HIGH FREQUENCY MONO STRZ (ELECTRODE) IMPLANT
CATH ROBINSON RED A/P 16FR (CATHETERS) ×4 IMPLANT
CLOSURE WOUND 1/4 X3 (GAUZE/BANDAGES/DRESSINGS)
CLOTH BEACON ORANGE TIMEOUT ST (SAFETY) ×4 IMPLANT
CONT PATH 16OZ SNAP LID 3702 (MISCELLANEOUS) ×4 IMPLANT
COVER BACK TABLE 60X90IN (DRAPES) ×4 IMPLANT
DECANTER SPIKE VIAL GLASS SM (MISCELLANEOUS) ×4 IMPLANT
DEVICE CAPIO SLIM SINGLE (INSTRUMENTS) IMPLANT
DRSG COVADERM PLUS 2X2 (GAUZE/BANDAGES/DRESSINGS) ×4 IMPLANT
DRSG OPSITE POSTOP 3X4 (GAUZE/BANDAGES/DRESSINGS) ×2 IMPLANT
DURAPREP 26ML APPLICATOR (WOUND CARE) ×4 IMPLANT
ELECT LIGASURE LONG (ELECTRODE) ×4 IMPLANT
ELECT REM PT RETURN 9FT ADLT (ELECTROSURGICAL) ×4
ELECTRODE REM PT RTRN 9FT ADLT (ELECTROSURGICAL) IMPLANT
FORCEPS CUTTING 45CM 5MM (CUTTING FORCEPS) ×4 IMPLANT
GAUZE PACKING 2X5 YD STRL (GAUZE/BANDAGES/DRESSINGS) ×2 IMPLANT
GLOVE BIO SURGEON STRL SZ8 (GLOVE) ×4 IMPLANT
GLOVE BIOGEL PI IND STRL 6.5 (GLOVE) ×2 IMPLANT
GLOVE BIOGEL PI IND STRL 7.0 (GLOVE) ×6 IMPLANT
GLOVE BIOGEL PI INDICATOR 6.5 (GLOVE) ×2
GLOVE BIOGEL PI INDICATOR 7.0 (GLOVE) ×6
GLOVE SURG ORTHO 8.0 STRL STRW (GLOVE) ×12 IMPLANT
GOWN STRL REUS W/TWL LRG LVL3 (GOWN DISPOSABLE) ×16 IMPLANT
LEGGING LITHOTOMY PAIR STRL (DRAPES) ×4 IMPLANT
LIGASURE LAP L-HOOKWIRE 5 44CM (INSTRUMENTS) IMPLANT
LIQUID BAND (GAUZE/BANDAGES/DRESSINGS) ×4 IMPLANT
NDL MAYO 6 CRC TAPER PT (NEEDLE) IMPLANT
NDL SPNL 22GX3.5 QUINCKE BK (NEEDLE) IMPLANT
NEEDLE HYPO 22GX1.5 SAFETY (NEEDLE) IMPLANT
NEEDLE INSUFFLATION 120MM (ENDOMECHANICALS) ×4 IMPLANT
NEEDLE MAYO 6 CRC TAPER PT (NEEDLE) ×4 IMPLANT
NEEDLE SPNL 22GX3.5 QUINCKE BK (NEEDLE) IMPLANT
NS IRRIG 1000ML POUR BTL (IV SOLUTION) ×4 IMPLANT
PACK LAVH (CUSTOM PROCEDURE TRAY) ×4 IMPLANT
PACK ROBOTIC GOWN (GOWN DISPOSABLE) ×4 IMPLANT
PACK VAGINAL WOMENS (CUSTOM PROCEDURE TRAY) ×2 IMPLANT
PAD TRENDELENBURG POSITION (MISCELLANEOUS) ×4 IMPLANT
SET IRRIG TUBING LAPAROSCOPIC (IRRIGATION / IRRIGATOR) IMPLANT
SLEEVE XCEL OPT CAN 5 100 (ENDOMECHANICALS) ×4 IMPLANT
SOLUTION ELECTROLUBE (MISCELLANEOUS) IMPLANT
STRIP CLOSURE SKIN 1/4X3 (GAUZE/BANDAGES/DRESSINGS) IMPLANT
SUT CAPIO ETHIBPND (SUTURE) ×4 IMPLANT
SUT MNCRL 0 MO-4 VIOLET 18 CR (SUTURE) ×4 IMPLANT
SUT MNCRL 0 VIOLET 6X18 (SUTURE) ×2 IMPLANT
SUT MNCRL AB 0 CT1 27 (SUTURE) IMPLANT
SUT MON AB 2-0 CT1 27 (SUTURE) ×16 IMPLANT
SUT MON AB 2-0 CT1 36 (SUTURE) IMPLANT
SUT MONOCRYL 0 6X18 (SUTURE) ×2
SUT MONOCRYL 0 MO 4 18  CR/8 (SUTURE) ×4
SUT PDS AB 0 CT1 27 (SUTURE) IMPLANT
SUT VICRYL 0 UR6 27IN ABS (SUTURE) ×4 IMPLANT
SUT VICRYL RAPIDE 3 0 (SUTURE) ×4 IMPLANT
TOWEL OR 17X24 6PK STRL BLUE (TOWEL DISPOSABLE) ×8 IMPLANT
TRAY FOLEY CATH SILVER 14FR (SET/KITS/TRAYS/PACK) ×4 IMPLANT
TROCAR OPTI TIP 5M 100M (ENDOMECHANICALS) ×4 IMPLANT
TROCAR XCEL DIL TIP R 11M (ENDOMECHANICALS) ×4 IMPLANT
WARMER LAPAROSCOPE (MISCELLANEOUS) ×4 IMPLANT
WATER STERILE IRR 1000ML POUR (IV SOLUTION) ×2 IMPLANT

## 2015-07-08 NOTE — Anesthesia Procedure Notes (Signed)
Procedure Name: Intubation Date/Time: 07/08/2015 7:38 AM Performed by: Flossie Dibble Pre-anesthesia Checklist: Patient being monitored, Suction available, Emergency Drugs available, Patient identified and Timeout performed Patient Re-evaluated:Patient Re-evaluated prior to inductionOxygen Delivery Method: Circle system utilized Preoxygenation: Pre-oxygenation with 100% oxygen Intubation Type: IV induction Ventilation: Oral airway inserted - appropriate to patient size and Mask ventilation without difficulty Laryngoscope Size: 3 (Low Pro 3) Grade View: Grade I Tube type: Oral Tube size: 7.0 mm Number of attempts: 1 Airway Equipment and Method: Video-laryngoscopy Secured at: 21 cm Tube secured with: Tape Dental Injury: Teeth and Oropharynx as per pre-operative assessment  Difficulty Due To: Difficulty was anticipated, Difficult Airway- due to anterior larynx and Difficult Airway- due to limited oral opening

## 2015-07-08 NOTE — Brief Op Note (Signed)
07/08/2015  9:24 AM  PATIENT:  Abigail Robinson  65 y.o. female  PRE-OPERATIVE DIAGNOSIS:  symptomatic uterine prolapse, cystocele, rectocele  POST-OPERATIVE DIAGNOSIS:  symptomatic uterine prolapse, cystocele, rectocele  PROCEDURE:  Procedure(s): LAPAROSCOPIC ASSISTED VAGINAL HYSTERECTOMY WITH BILATERAL SALPINGO OOPHORECTOMY (Bilateral) ANTERIOR AND POSTERIOR REPAIR  (N/A)  SURGEON:  Surgeon(s) and Role:    * Louretta Shorten, MD - Primary    * Arvella Nigh, MD - Assisting  PHYSICIAN ASSISTANT:   ASSISTANTS: [place]   ANESTHESIA:   general  EBL:  Total I/O In: 2000 [I.V.:2000] Out: 500 [Urine:150; Blood:350]  BLOOD ADMINISTERED:none  DRAINS: Urinary Catheter (Foley)   LOCAL MEDICATIONS USED:  MARCAINE     SPECIMEN:  Source of Specimen:  uterus tubes, right ovary and possible left remnant  DISPOSITION OF SPECIMEN:  PATHOLOGY  COUNTS:  YES 1TOURNIQUET:  * No tourniquets in log *  DICTATION: .Other Dictation: Dictation Number 1  PLAN OF CARE: Admit to inpatient   PATIENT DISPOSITION:  PACU - hemodynamically stable.   Delay start of Pharmacological VTE agent (>24hrs) due to surgical blood loss or risk of bleeding: not applicable

## 2015-07-08 NOTE — Transfer of Care (Signed)
Immediate Anesthesia Transfer of Care Note  Patient: Abigail Robinson  Procedure(s) Performed: Procedure(s): LAPAROSCOPIC ASSISTED VAGINAL HYSTERECTOMY WITH BILATERAL SALPINGO OOPHORECTOMY (Bilateral) ANTERIOR AND POSTERIOR REPAIR  (N/A)  Patient Location: PACU  Anesthesia Type:General  Level of Consciousness: awake, alert  and oriented  Airway & Oxygen Therapy: Patient Spontanous Breathing and Patient connected to face mask oxygen  Post-op Assessment: Report given to RN and Post -op Vital signs reviewed and stable  Post vital signs: Reviewed and stable  Last Vitals:  Filed Vitals:   07/08/15 0604  BP: 113/70  Pulse: 56  Temp: 36.8 C  Resp: 18    Complications: No apparent anesthesia complications

## 2015-07-08 NOTE — Anesthesia Postprocedure Evaluation (Signed)
Anesthesia Post Note  Patient: Abigail Robinson  Procedure(s) Performed: Procedure(s) (LRB): LAPAROSCOPIC ASSISTED VAGINAL HYSTERECTOMY WITH BILATERAL SALPINGO OOPHORECTOMY (Bilateral) ANTERIOR AND POSTERIOR REPAIR  (N/A)  Patient location during evaluation: PACU Anesthesia Type: General Level of consciousness: awake and alert Pain management: pain level controlled Vital Signs Assessment: post-procedure vital signs reviewed and stable Respiratory status: spontaneous breathing, nonlabored ventilation, respiratory function stable and patient connected to nasal cannula oxygen Cardiovascular status: blood pressure returned to baseline and stable Postop Assessment: no signs of nausea or vomiting Anesthetic complications: no    Last Vitals:  Filed Vitals:   07/08/15 0936 07/08/15 0945  BP: 119/69 117/70  Pulse: 66 60  Temp: 36.3 C   Resp: 12 14    Last Pain: There were no vitals filed for this visit.               Effie Berkshire

## 2015-07-09 ENCOUNTER — Encounter (HOSPITAL_COMMUNITY): Payer: Self-pay | Admitting: Obstetrics and Gynecology

## 2015-07-09 LAB — CBC
HEMATOCRIT: 31.7 % — AB (ref 36.0–46.0)
Hemoglobin: 10.7 g/dL — ABNORMAL LOW (ref 12.0–15.0)
MCH: 30.2 pg (ref 26.0–34.0)
MCHC: 33.8 g/dL (ref 30.0–36.0)
MCV: 89.5 fL (ref 78.0–100.0)
PLATELETS: 228 10*3/uL (ref 150–400)
RBC: 3.54 MIL/uL — ABNORMAL LOW (ref 3.87–5.11)
RDW: 13.7 % (ref 11.5–15.5)
WBC: 13.3 10*3/uL — AB (ref 4.0–10.5)

## 2015-07-09 MED ORDER — ATENOLOL 50 MG PO TABS
50.0000 mg | ORAL_TABLET | Freq: Every day | ORAL | Status: DC
  Administered 2015-07-09: 50 mg via ORAL
  Filled 2015-07-09: qty 1

## 2015-07-09 MED ORDER — LEVOTHYROXINE SODIUM 75 MCG PO TABS
75.0000 ug | ORAL_TABLET | Freq: Every day | ORAL | Status: DC
  Administered 2015-07-10: 75 ug via ORAL
  Filled 2015-07-09: qty 1

## 2015-07-09 MED ORDER — LOSARTAN POTASSIUM 50 MG PO TABS
100.0000 mg | ORAL_TABLET | Freq: Every day | ORAL | Status: DC
  Administered 2015-07-09 – 2015-07-10 (×2): 100 mg via ORAL
  Filled 2015-07-09 (×3): qty 2

## 2015-07-09 NOTE — Progress Notes (Signed)
1 Day Post-Op Procedure(s) (LRB): LAPAROSCOPIC ASSISTED VAGINAL HYSTERECTOMY WITH BILATERAL SALPINGO OOPHORECTOMY (Bilateral) ANTERIOR AND POSTERIOR REPAIR  (N/A)  Subjective: Patient reports tolerating PO, + flatus and no problems voiding.    Objective: I have reviewed patient's vital signs, intake and output, medications and labs.  General: alert Vaginal Bleeding: minimal  Minimal pain, able to void  Assessment: s/p Procedure(s): LAPAROSCOPIC ASSISTED VAGINAL HYSTERECTOMY WITH BILATERAL SALPINGO OOPHORECTOMY (Bilateral) ANTERIOR AND POSTERIOR REPAIR  (N/A): stable and progressing well  Plan: Advance diet Encourage ambulation Advance to PO medication voiding trial today  LOS: 1 day    Abigail Robinson C 07/09/2015, 10:32 AM

## 2015-07-09 NOTE — Op Note (Signed)
Abigail Robinson, Abigail Robinson NO.:  [id number]  MEDICAL RECORD NO.:  [id number]  LOCATION:  9305                          FACILITY:  [facility]  PHYSICIAN:  Monia Sabal. Corinna Capra, M.D.    DATE OF BIRTH:  1950-10-21  DATE OF PROCEDURE:  07/08/2015 DATE OF DISCHARGE:                              OPERATIVE REPORT   PREOPERATIVE DIAGNOSES:  Symptomatic pelvic relaxation with uterine prolapse, cystocele, and rectocele.  POSTOPERATIVE DIAGNOSES:  Symptomatic pelvic relaxation with uterine prolapse, cystocele, rectocele, endometriosis.  PROCEDURE:  Laparoscopic-assisted vaginal hysterectomy with bilateral salpingo-oophorectomy and anterior-posterior colporrhaphy.  SURGEON:  Monia Sabal. Corinna Capra, M.D.  ASSISTANT:  Darlyn Chamber, M.D.  INDICATIONS:  Ms. Broll is postmenopausal with worsening prolapse of the uterus, bladder, and rectum to the point that it is bothersome for her on a regular basis as the cervix presents to the introitus.  She desires definitive surgical intervention and presents for the above procedure.  The risks and benefits and pros and cons were discussed at length.  Informed consent was obtained.  FINDINGS:  At the time of surgery, normal-appearing liver, appendix. Uterus is normal in appearance.  Bilateral fallopian tubes had been transected.  Right ovary appears to be normal.  Left ovary, there is only a remnant of ovary is left.  There is endometriosis peppered throughout the cul-de-sac.  DESCRIPTION OF PROCEDURE:  After adequate analgesia, the patient was placed in the dorsal lithotomy position.  She was sterilely prepped and draped.  Bladder was sterilely drained.  Graves speculum was placed.  A Cohen tenaculum was placed on the cervix.  A 1-cm infraumbilical skin incision was made.  A Veress needle was inserted.  The abdomen was insufflated with dullness to percussion.  An 11 mm trocar was inserted. The above findings were noted by the laparoscope.  A 5-mm trocar  was inserted to the left of the midline under direct visualization.  Gyrus cutting forceps were used to ligate across the right and left mesosalpinx that has crossed the right infundibulopelvic ligament down to the inferior portion of the broad ligament and across the round ligament.  The left utero-ovarian ligament was then dissected with only a small area that appeared to be ovarian tissue on this side was removed.  Unclear whether this was a portion of the ovary or scar tissue since whole ovary was not visualized.  The bladder flap was created and dissected anteriorly.  The abdomen was then desufflated, legs were repositioned.  A posterior weighted speculum was placed, a posterior colpotomy was performed.  The cervix was then circumscribed with Bovie cautery.  LigaSure instrument was used to ligate across the uterosacral ligaments, cardinal ligaments, and bladder pillars bilaterally.  The anterior vaginal mucosa was dissected off the anterior surface of the cervix.  Anterior peritoneum was entered sharply.  Deaver retractor placed underneath the bladder.  LigaSure instrument was then used to ligate across the inferior portion of the broad ligament.  The uterus, right tube and ovary and the left fallopian tube and questionable portion of ovary was removed.  Uterosacral ligaments were identified, suture ligated with figure-of-eights of 0 Monocryl suture.  They were plicated in the midline and actually good  apical support was noted. Posterior peritoneum was then closed in a pursestring fashion.  After plicating the uterosacral ligaments, the posterior vaginal mucosa was then closed with figure-of-eights of 0 Monocryl suture with good support and good hemostasis noted.  The anterior vaginal mucosa was then undermined and reflected laterally to approximately 1-2 cm away from the urethral meatus.  The pubovesical cervical fascia was then plicated in the midline using figure-of-eights of 0  Monocryl suture.  Excess vaginal mucosa was excised, then the anterior vagina was then closed with a running, locking suture of 2-0 Monocryl with good support and good approximation noted.  A small triangular flap was made across the perineal body.  Posterior vaginal mucosa was then undermined in the midline, dissected laterally until approximately 2-3 cm from the apex of the vagina.  The perirectal fascia was then plicated in the midline with good posterior support noted.  The apex of the vagina could not be stretched to the uterosacral ligaments and was approximately 2-3 cm away from this ligament, so unable to perform a sacrospinous suspension, but excellent apical support as well as the anterior and posterior support were noted.  Excess posterior vaginal mucosa was then trimmed and the posterior vaginal mucosa was then closed with a 2-0 Monocryl suture in a running, locking fashion.  Perineorrhaphy was then performed and the perineal body was then closed with subcuticular suture of 2-0 Vicryl. Exam at this time revealed excellent anterior and posterior supports, apical support was excellent as well.  A 2 inch Kerlix with estrogen cream was then packed in the vagina.  Foley catheter was placed with return of clear yellow urine.  Legs were repositioned, abdomen re- insufflated.  Nezhat suction irrigator was used to irrigate the abdomen and pelvis.  Careful examination of the pedicles revealed good hemostasis.  Excellent hemostasis of the cuff.  Ureters were identified bilaterally with good peristalsis noted.  The abdomen was then desufflated.  Trocars were removed.  The infraumbilical skin incision was closed with 0 Vicryl interrupted suture, the fascia with 3-0 Vicryl Rapide subcuticular suture, the 5 mm site was closed with 3-0 Vicryl Rapide subcuticular suture.  The incisions were then injected with 0.25% Marcaine, total 10 mL used.  The patient was transferred to the recovery room  in stable condition.  Sponge and instrument count was normal x3. Estimated blood loss 350 mL.  The patient received 2 g of cefotetan preoperatively.     Monia Sabal Corinna Capra, M.D.     DCL/MEDQ  D:  07/08/2015  T:  07/08/2015  Job:  [id number]

## 2015-07-10 NOTE — Progress Notes (Signed)
Discharge teaching complete. Pt understood all information and did not have any questions. Pt ambulated out of the hospital and discharged home to family.  

## 2015-07-10 NOTE — Discharge Summary (Addendum)
Physician Discharge Summary  Patient ID: Abigail Robinson MRN: [id number] DOB/AGE: 66-13-1952 66 y.o.  Admit date: 07/08/2015 Discharge date: 07/10/2015  Admission Diagnoses:Symptomatic pelvic relaxation with uterine prolapse, cystocele, rectocele, endometriosis  Discharge Diagnoses: Symptomatic pelvic relaxation with uterine prolapse, cystocele, rectocele, endometriosis Active Problems:   S/P laparoscopic assisted vaginal hysterectomy (LAVH) A&P repair, BSO Discharged Condition: good  Hospital Course: Pt underwent uncomplicated LAVH, BSO, A&P repair with ebl 350cc.  Her post op course was unremarkable with quick return of bowel and bladder function by POD 1. Hgb was 10.7.  Voiding trial showed PVR <100cc with normal voids.  Pt d/c'd on POD 2  Consults: None  Significant Diagnostic Studies: labs: Hgb 10.7  Treatments: surgery: as above  Discharge Exam: Blood pressure 119/57, pulse 70, temperature 98.3 F (36.8 C), temperature source Oral, resp. rate 18, height 5\' 7"  (1.702 m), weight 182 lb (82.555 kg), SpO2 97 %. General appearance: alert, cooperative, appears stated age and no distress GI: soft, non-tender; bowel sounds normal; no masses,  no organomegaly Female genitalia: normal, NA Incision/Wound:INC CD&I  Disposition: Final discharge disposition not confirmed  D/C home with fu in 1-2 weeks  Discharge Instructions    Call MD for:  difficulty breathing, headache or visual disturbances    Complete by:  As directed      Call MD for:  persistant nausea and vomiting    Complete by:  As directed      Call MD for:  redness, tenderness, or signs of infection (pain, swelling, redness, odor or green/yellow discharge around incision site)    Complete by:  As directed      Call MD for:  severe uncontrolled pain    Complete by:  As directed      Call MD for:  temperature >100.4    Complete by:  As directed      Diet general    Complete by:  As directed      Driving Restrictions     Complete by:  As directed   No driving for 2 weeks     Increase activity slowly    Complete by:  As directed      Lifting restrictions    Complete by:  As directed   No lifting anything greater than 10 pounds (if you have to ask, don't lift it)     Sexual Activity Restrictions    Complete by:  As directed   Nothing in the vagina for 6 weeks            Medication List    TAKE these medications        acetaminophen 500 MG tablet  Commonly known as:  TYLENOL  Take 1,000 mg by mouth 2 (two) times daily as needed for moderate pain or headache.     atenolol 50 MG tablet  Commonly known as:  TENORMIN  Take 1 tablet by mouth Daily.     Calcium Carb-Cholecalciferol 500-600 MG-UNIT Tabs  Take 1 tablet by mouth daily.     ibuprofen 200 MG tablet  Commonly known as:  ADVIL,MOTRIN  Take 400 mg by mouth daily as needed for mild pain.     levothyroxine 75 MCG tablet  Commonly known as:  SYNTHROID, LEVOTHROID  Take 75 mcg by mouth daily before breakfast.     losartan 100 MG tablet  Commonly known as:  COZAAR  Take 100 mg by mouth daily.     omeprazole 20 MG capsule  Commonly known as:  PRILOSEC  Take 20 mg by mouth daily as needed (for heartburn).     PROBIOTIC PO  Take 1 capsule by mouth daily. Ultimate 16 strain w/ trace minerals & sos     Vitamin A & D 5000-400 units Caps  Take 1 capsule by mouth daily.     vitamin B-12 1000 MCG tablet  Commonly known as:  CYANOCOBALAMIN  Take 1,000 mcg by mouth daily.         Signed: Toi Stelly C 07/10/2015, 10:09 AM

## 2015-07-10 NOTE — Progress Notes (Signed)
Pt was lying in bed when I arrived. She invited me to sit down and visit. She told me about her family, college, and surgery. As she described her feelings post surgery, she noted that for the first time she could talk about it without crying and commended that it was a blessing to be able to do so. She talked of how she cried w/her nurse earlier. 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 She shared a big part of her history, in particular, when she and her husband decided to adopt and the night before classes her husband changed his mind. She described how it hurt her and how she prayed about it and eventually gained peace. Chaplain Ernest Haber, M.Div.   07/09/15 1755  Clinical Encounter Type  Visited With Patient

## 2015-07-26 DIAGNOSIS — K219 Gastro-esophageal reflux disease without esophagitis: Secondary | ICD-10-CM | POA: Diagnosis not present

## 2015-07-26 DIAGNOSIS — Z6828 Body mass index (BMI) 28.0-28.9, adult: Secondary | ICD-10-CM | POA: Diagnosis not present

## 2015-09-01 DIAGNOSIS — I10 Essential (primary) hypertension: Secondary | ICD-10-CM | POA: Diagnosis not present

## 2015-09-01 DIAGNOSIS — N390 Urinary tract infection, site not specified: Secondary | ICD-10-CM | POA: Diagnosis not present

## 2015-09-01 DIAGNOSIS — R8299 Other abnormal findings in urine: Secondary | ICD-10-CM | POA: Diagnosis not present

## 2015-09-01 DIAGNOSIS — E038 Other specified hypothyroidism: Secondary | ICD-10-CM | POA: Diagnosis not present

## 2015-09-08 ENCOUNTER — Encounter: Payer: Self-pay | Admitting: Gastroenterology

## 2015-09-08 ENCOUNTER — Ambulatory Visit (INDEPENDENT_AMBULATORY_CARE_PROVIDER_SITE_OTHER): Payer: Medicare Other | Admitting: Gastroenterology

## 2015-09-08 VITALS — BP 120/70 | HR 68 | Ht 67.0 in | Wt 180.4 lb

## 2015-09-08 DIAGNOSIS — E038 Other specified hypothyroidism: Secondary | ICD-10-CM | POA: Diagnosis not present

## 2015-09-08 DIAGNOSIS — J302 Other seasonal allergic rhinitis: Secondary | ICD-10-CM | POA: Diagnosis not present

## 2015-09-08 DIAGNOSIS — Z0000 Encounter for general adult medical examination without abnormal findings: Secondary | ICD-10-CM | POA: Diagnosis not present

## 2015-09-08 DIAGNOSIS — N814 Uterovaginal prolapse, unspecified: Secondary | ICD-10-CM | POA: Diagnosis not present

## 2015-09-08 DIAGNOSIS — I10 Essential (primary) hypertension: Secondary | ICD-10-CM | POA: Diagnosis not present

## 2015-09-08 DIAGNOSIS — K219 Gastro-esophageal reflux disease without esophagitis: Secondary | ICD-10-CM | POA: Diagnosis not present

## 2015-09-08 DIAGNOSIS — Z6828 Body mass index (BMI) 28.0-28.9, adult: Secondary | ICD-10-CM | POA: Diagnosis not present

## 2015-09-08 DIAGNOSIS — R1310 Dysphagia, unspecified: Secondary | ICD-10-CM | POA: Diagnosis not present

## 2015-09-08 DIAGNOSIS — E7800 Pure hypercholesterolemia, unspecified: Secondary | ICD-10-CM | POA: Diagnosis not present

## 2015-09-08 DIAGNOSIS — M479 Spondylosis, unspecified: Secondary | ICD-10-CM | POA: Diagnosis not present

## 2015-09-08 DIAGNOSIS — R319 Hematuria, unspecified: Secondary | ICD-10-CM | POA: Diagnosis not present

## 2015-09-08 DIAGNOSIS — D692 Other nonthrombocytopenic purpura: Secondary | ICD-10-CM | POA: Diagnosis not present

## 2015-09-08 DIAGNOSIS — R32 Unspecified urinary incontinence: Secondary | ICD-10-CM | POA: Diagnosis not present

## 2015-09-08 NOTE — Patient Instructions (Signed)
You will be set up for an upper endoscopy; for GERD, dysphagia. You should change the way you are taking your antiacid medicine (omeprazole) so that you are taking it 20-30 minutes prior to a decent meal as that is the way the pill is designed to work most effectively.

## 2015-09-08 NOTE — Progress Notes (Signed)
Review of pertinent gastrointestinal problems: 1. Routine risk for colon cancer: Colonoscopy Dr. Lajoyce Corners was normal 2003. Colonoscopy Dr. Ardis Hughs 2013 found single polyp that was not precancerous. She was recommended to have repeat colonoscopy in 10 years.   HPI: This is a    very pleasant 65 year old woman   who was referred to me by Tisovec, Fransico Him, MD  to evaluate  chronic GERD .    Chief complaint is chronic GERD, intermittent dysphasia  Pyrosis for many years.    She takes PPI (omeprazole) at 10AM daily usually between BF and lunch meals.   Certain foods will still cause pyrosis.  She has dysphagia to solids only,   Has lost 15 pounds gradually, intentionally over several years.  She was told she had "borderline" positive H. Pylori test and underwent antibiotic treatment  In [employer] 2017.  Was by blood serology testing.  No real change of GERD symptoms after h. Pylori treatment.  Review of systems: Pertinent positive and negative review of systems were noted in the above HPI section. Complete review of systems was performed and was otherwise normal.   Past Medical History  Diagnosis Date  . GERD (gastroesophageal reflux disease)   . Hypertension   . Allergy   . Thyroid disease   . Hypothyroidism   . Headache     Migraines  . Vertigo   . Arthritis     back, hands  . Cancer ([place])     skin on face  . Endometriosis   . Complication of anesthesia     prolonged sedation, slow to wake up    Past Surgical History  Procedure Laterality Date  . Oophorectomy  age 66    for endometrosis  . Colonoscopy    . Diagnostic laparoscopy    . Laparoscopic vaginal hysterectomy with salpingo oophorectomy Bilateral 07/08/2015    Procedure: LAPAROSCOPIC ASSISTED VAGINAL HYSTERECTOMY WITH BILATERAL SALPINGO OOPHORECTOMY;  Surgeon: Louretta Shorten, MD;  Location: [facility];  Service: Gynecology;  Laterality: Bilateral;  . Anterior and posterior repair with sacrospinous fixation N/A 07/08/2015   Procedure: ANTERIOR AND POSTERIOR REPAIR ;  Surgeon: Louretta Shorten, MD;  Location: [facility];  Service: Gynecology;  Laterality: N/A;    Current Outpatient Prescriptions  Medication Sig Dispense Refill  . acetaminophen (TYLENOL) 500 MG tablet Take 1,000 mg by mouth 2 (two) times daily as needed for moderate pain or headache.     Marland Kitchen atenolol (TENORMIN) 50 MG tablet Take 1 tablet by mouth Daily.    . Calcium Carb-Cholecalciferol 500-600 MG-UNIT TABS Take 1 tablet by mouth daily.    Marland Kitchen ibuprofen (ADVIL,MOTRIN) 200 MG tablet Take 400 mg by mouth daily as needed for mild pain.    Marland Kitchen levothyroxine (SYNTHROID, LEVOTHROID) 75 MCG tablet Take 75 mcg by mouth at bedtime.     Marland Kitchen losartan (COZAAR) 100 MG tablet Take 100 mg by mouth daily.    Marland Kitchen omeprazole (PRILOSEC) 20 MG capsule Take 20 mg by mouth daily as needed (for heartburn).     . Probiotic Product (PROBIOTIC PO) Take 1 capsule by mouth daily. Ultimate 16 strain w/ trace minerals & sos    . vitamin B-12 (CYANOCOBALAMIN) 1000 MCG tablet Take 1,000 mcg by mouth daily.    . Vitamins A & D (VITAMIN A & D) 5000-400 UNITS CAPS Take 1 capsule by mouth daily.     No current facility-administered medications for this visit.    Allergies as of 09/08/2015 - Review Complete 09/08/2015  Allergen Reaction Noted  .  Demerol [meperidine] Nausea Only and Other (See Comments) 01/22/2012    Family History  Problem Relation Age of Onset  . Colon cancer Neg Hx   . Liver cancer Mother   . Heart disease Father   . Kidney cancer Mother     Social History   Social History  . Marital Status: Married    Spouse Name: N/A  . Number of Children: N/A  . Years of Education: N/A   Occupational History  . Not on file.   Social History Main Topics  . Smoking status: Never Smoker   . Smokeless tobacco: Never Used  . Alcohol Use: Yes     Comment: 1 glass wine occasional-maybe once month.  . Drug Use: No  . Sexual Activity: Not on file   Other Topics Concern  . Not on  file   Social History Narrative     Physical Exam: BP 120/70 mmHg  Pulse 68  Ht 5\' 7"  (1.702 m)  Wt 180 lb 6.4 oz (81.829 kg)  BMI 28.25 kg/m2 Constitutional: generally well-appearing Psychiatric: alert and oriented x3 Eyes: extraocular movements intact Mouth: oral pharynx moist, no lesions Neck: supple no lymphadenopathy Cardiovascular: heart regular rate and rhythm Lungs: clear to auscultation bilaterally Abdomen: soft, nontender, nondistended, no obvious ascites, no peritoneal signs, normal bowel sounds Extremities: no lower extremity edema bilaterally Skin: no lesions on visible extremities   Assessment and plan: 65 y.o. female with  chronic GERD, intermittent nonprogressive solid food dysphagia  First, she is not taking her proton pump inhibitor at the correct time and relation to meals. She is going to change that. Second I recommended we proceed with EGD at her soonest convenience to check for GERD related damage including esophagitis, Barrett's change, narrowing of the esophagus since she has dysphagia.    I see no reason for any further blood tests or imaging studies prior to then.   Owens Loffler, MD [facility] 09/08/2015, 3:15 PM  Cc: Haywood Pao, MD

## 2015-11-05 ENCOUNTER — Ambulatory Visit (AMBULATORY_SURGERY_CENTER): Payer: Medicare Other | Admitting: Gastroenterology

## 2015-11-05 ENCOUNTER — Encounter: Payer: Self-pay | Admitting: Gastroenterology

## 2015-11-05 VITALS — BP 130/60 | HR 59 | Temp 98.9°F | Resp 16 | Ht 67.0 in | Wt 180.0 lb

## 2015-11-05 DIAGNOSIS — R1310 Dysphagia, unspecified: Secondary | ICD-10-CM

## 2015-11-05 DIAGNOSIS — K222 Esophageal obstruction: Secondary | ICD-10-CM

## 2015-11-05 DIAGNOSIS — K219 Gastro-esophageal reflux disease without esophagitis: Secondary | ICD-10-CM | POA: Diagnosis not present

## 2015-11-05 DIAGNOSIS — E039 Hypothyroidism, unspecified: Secondary | ICD-10-CM | POA: Diagnosis not present

## 2015-11-05 DIAGNOSIS — I10 Essential (primary) hypertension: Secondary | ICD-10-CM | POA: Diagnosis not present

## 2015-11-05 MED ORDER — SODIUM CHLORIDE 0.9 % IV SOLN: 500.0000 mL | INTRAVENOUS | Status: DC

## 2015-11-05 NOTE — Patient Instructions (Signed)
YOU HAD AN ENDOSCOPIC PROCEDURE TODAY AT [facility]:   Refer to the procedure report that was given to you for any specific questions about what was found during the examination.  If the procedure report does not answer your questions, please call your gastroenterologist to clarify.  If you requested that your care partner not be given the details of your procedure findings, then the procedure report has been included in a sealed envelope for you to review at your convenience later.  YOU SHOULD EXPECT: Some feelings of bloating in the abdomen. Passage of more gas than usual.  Walking can help get rid of the air that was put into your GI tract during the procedure and reduce the bloating. If you had a lower endoscopy (such as a colonoscopy or flexible sigmoidoscopy) you may notice spotting of blood in your stool or on the toilet paper. If you underwent a bowel prep for your procedure, you may not have a normal bowel movement for a few days.  Please Note:  You might notice some irritation and congestion in your nose or some drainage.  This is from the oxygen used during your procedure.  There is no need for concern and it should clear up in a day or so.  SYMPTOMS TO REPORT IMMEDIATELY:    Following upper endoscopy (EGD)  Vomiting of blood or coffee ground material  New chest pain or pain under the shoulder blades  Painful or persistently difficult swallowing  New shortness of breath  Fever of 100F or higher  Black, tarry-looking stools  For urgent or emergent issues, a gastroenterologist can be reached at any hour by calling [phone].   DIET:  Follow Dilation Handout.  ACTIVITY:  You should plan to take it easy for the rest of today and you should NOT DRIVE or use heavy machinery until tomorrow (because of the sedation medicines used during the test).    FOLLOW UP: Our staff will call the number listed on your records the next business day following your procedure to  check on you and address any questions or concerns that you may have regarding the information given to you following your procedure. If we do not reach you, we will leave a message.  However, if you are feeling well and you are not experiencing any problems, there is no need to return our call.  We will assume that you have returned to your regular daily activities without incident.  If any biopsies were taken you will be contacted by phone or by letter within the next 1-3 weeks.  Please call us at [phone] if you have not heard about the biopsies in 3 weeks.    SIGNATURES/CONFIDENTIALITY: You and/or your care partner have signed paperwork which will be entered into your electronic medical record.  These signatures attest to the fact that that the information above on your After Visit Summary has been reviewed and is understood.  Full responsibility of the confidentiality of this discharge information lies with you and/or your care-partner.   Resume medication Information given on dilation diet.

## 2015-11-05 NOTE — Progress Notes (Signed)
Called to room to assist during endoscopic procedure.  Patient ID and intended procedure confirmed with present staff. Received instructions for my participation in the procedure from the performing physician.  

## 2015-11-05 NOTE — Progress Notes (Signed)
To recovery, report to RN, VSS. 

## 2015-11-05 NOTE — Op Note (Signed)
Abigail Robinson Patient Name: Lee Nwankwo Procedure Date: 11/05/2015 2:22 PM MRN: [id number] Endoscopist: Milus Banister , MD Age: 65 Referring MD:  Date of Birth: 1950-12-04 Gender: Female Account #: [id number] Procedure:                Upper GI endoscopy Indications:              Dysphagia Medicines:                Monitored Anesthesia Care Procedure:                Pre-Anesthesia Assessment:                           - Prior to the procedure, a History and Physical                            was performed, and patient medications and                            allergies were reviewed. The patient's tolerance of                            previous anesthesia was also reviewed. The risks                            and benefits of the procedure and the sedation                            options and risks were discussed with the patient.                            All questions were answered, and informed consent                            was obtained. Prior Anticoagulants: The patient has                            taken no previous anticoagulant or antiplatelet                            agents. ASA Grade Assessment: II - A patient with                            mild systemic disease. After reviewing the risks                            and benefits, the patient was deemed in                            satisfactory condition to undergo the procedure.                           After obtaining informed consent, the endoscope was  passed under direct vision. Throughout the                            procedure, the patient's blood pressure, pulse, and                            oxygen saturations were monitored continuously. The                            Model GIF-HQ190 [id number]) scope was introduced                            through the mouth, and advanced to the second part                            of duodenum. The upper GI endoscopy was                           accomplished without difficulty. The patient                            tolerated the procedure well. Scope In: Scope Out: Findings:                 One mild benign-appearing, intrinsic stenosis                            (Schatzki's ring) was found at the gastroesophageal                            junction. This measured 1.5 cm (inner diameter) and                            was traversed. A TTS dilator was passed through the                            scope. Dilation with an 18-19-20 mm balloon dilator                            was performed to 20 mm. There was the typical                            superficial mucosal tear and self limited oozing of                            blood following dilation.                           The exam was otherwise without abnormality. Complications:            No immediate complications. Estimated blood loss:                            None. Estimated Blood Loss:     Estimated blood loss: none. Impression:               -  Benign-appearing esophageal stenosis. Dilated to                            43mm with TTS balloon.                           - The examination was otherwise normal. Recommendation:           - Patient has a contact number available for                            emergencies. The signs and symptoms of potential                            delayed complications were discussed with the                            patient. Return to normal activities tomorrow.                            Written discharge instructions were provided to the                            patient.                           - Resume previous diet.                           - Continue present medications. continue omeprazole                            once daily shortly before breakfast meal.                           - Repeat upper endoscopy PRN for retreatment. Milus Banister, MD 11/05/2015 2:56:20 PM This report has been signed  electronically.

## 2015-11-08 ENCOUNTER — Telehealth: Payer: Self-pay | Admitting: *Deleted

## 2015-11-08 NOTE — Telephone Encounter (Signed)
  Follow up Call-  Call back number 11/05/2015  Post procedure Call Back phone  # [phone]  Permission to leave phone message Yes  Some recent data might be hidden     Patient questions:  Do you have a fever, pain , or abdominal swelling? No. Pain Score  0 *  Have you tolerated food without any problems? Yes.    Have you been able to return to your normal activities? Yes.    Do you have any questions about your discharge instructions: Diet   No. Medications  No. Follow up visit  No.  Do you have questions or concerns about your Care? No.  Actions: * If pain score is 4 or above: No action needed, pain <4.

## 2015-12-04 DIAGNOSIS — Z23 Encounter for immunization: Secondary | ICD-10-CM | POA: Diagnosis not present

## 2015-12-09 DIAGNOSIS — Z6829 Body mass index (BMI) 29.0-29.9, adult: Secondary | ICD-10-CM | POA: Diagnosis not present

## 2015-12-09 DIAGNOSIS — M25511 Pain in right shoulder: Secondary | ICD-10-CM | POA: Diagnosis not present

## 2016-04-07 ENCOUNTER — Other Ambulatory Visit: Payer: Self-pay | Admitting: Internal Medicine

## 2016-04-07 DIAGNOSIS — Z1231 Encounter for screening mammogram for malignant neoplasm of breast: Secondary | ICD-10-CM

## 2016-05-02 ENCOUNTER — Ambulatory Visit
Admission: RE | Admit: 2016-05-02 | Discharge: 2016-05-02 | Disposition: A | Discharge status: Home / Self Care | Payer: Medicare Other | Source: Ambulatory Visit | Attending: Internal Medicine | Admitting: Internal Medicine

## 2016-05-02 ENCOUNTER — Encounter: Payer: Self-pay | Admitting: Radiology

## 2016-05-02 DIAGNOSIS — Z1231 Encounter for screening mammogram for malignant neoplasm of breast: Secondary | ICD-10-CM | POA: Diagnosis not present

## 2016-06-21 DIAGNOSIS — Z0000 Encounter for general adult medical examination without abnormal findings: Secondary | ICD-10-CM | POA: Diagnosis not present

## 2016-06-21 DIAGNOSIS — Z6841 Body Mass Index (BMI) 40.0 and over, adult: Secondary | ICD-10-CM | POA: Diagnosis not present

## 2016-06-21 DIAGNOSIS — I10 Essential (primary) hypertension: Secondary | ICD-10-CM | POA: Diagnosis not present

## 2016-06-21 DIAGNOSIS — C4490 Unspecified malignant neoplasm of skin, unspecified: Secondary | ICD-10-CM | POA: Diagnosis not present

## 2016-06-21 DIAGNOSIS — E039 Hypothyroidism, unspecified: Secondary | ICD-10-CM | POA: Diagnosis not present

## 2016-08-23 DIAGNOSIS — Z6830 Body mass index (BMI) 30.0-30.9, adult: Secondary | ICD-10-CM | POA: Diagnosis not present

## 2016-08-23 DIAGNOSIS — N958 Other specified menopausal and perimenopausal disorders: Secondary | ICD-10-CM | POA: Diagnosis not present

## 2016-08-23 DIAGNOSIS — Z124 Encounter for screening for malignant neoplasm of cervix: Secondary | ICD-10-CM | POA: Diagnosis not present

## 2016-10-27 DIAGNOSIS — R1030 Lower abdominal pain, unspecified: Secondary | ICD-10-CM | POA: Diagnosis not present

## 2016-11-08 DIAGNOSIS — R1030 Lower abdominal pain, unspecified: Secondary | ICD-10-CM | POA: Diagnosis not present

## 2016-11-29 DIAGNOSIS — M25551 Pain in right hip: Secondary | ICD-10-CM | POA: Diagnosis not present

## 2016-11-29 DIAGNOSIS — Z23 Encounter for immunization: Secondary | ICD-10-CM | POA: Diagnosis not present

## 2016-12-07 DIAGNOSIS — Z23 Encounter for immunization: Secondary | ICD-10-CM | POA: Diagnosis not present

## 2017-02-15 DIAGNOSIS — S76811A Strain of other specified muscles, fascia and tendons at thigh level, right thigh, initial encounter: Secondary | ICD-10-CM | POA: Diagnosis not present

## 2017-03-23 DIAGNOSIS — R1030 Lower abdominal pain, unspecified: Secondary | ICD-10-CM | POA: Diagnosis not present

## 2017-04-03 DIAGNOSIS — R1031 Right lower quadrant pain: Secondary | ICD-10-CM | POA: Diagnosis not present

## 2017-07-13 DIAGNOSIS — Z1211 Encounter for screening for malignant neoplasm of colon: Secondary | ICD-10-CM | POA: Diagnosis not present

## 2017-07-13 DIAGNOSIS — E039 Hypothyroidism, unspecified: Secondary | ICD-10-CM | POA: Diagnosis not present

## 2017-07-13 DIAGNOSIS — I10 Essential (primary) hypertension: Secondary | ICD-10-CM | POA: Diagnosis not present

## 2017-07-13 DIAGNOSIS — M1990 Unspecified osteoarthritis, unspecified site: Secondary | ICD-10-CM | POA: Diagnosis not present

## 2017-07-13 DIAGNOSIS — K219 Gastro-esophageal reflux disease without esophagitis: Secondary | ICD-10-CM | POA: Diagnosis not present

## 2017-09-07 DIAGNOSIS — R0789 Other chest pain: Secondary | ICD-10-CM | POA: Diagnosis not present

## 2017-09-07 DIAGNOSIS — K219 Gastro-esophageal reflux disease without esophagitis: Secondary | ICD-10-CM | POA: Diagnosis not present

## 2017-09-07 DIAGNOSIS — I10 Essential (primary) hypertension: Secondary | ICD-10-CM | POA: Diagnosis not present

## 2017-12-18 ENCOUNTER — Other Ambulatory Visit: Payer: Self-pay | Admitting: Internal Medicine

## 2017-12-18 DIAGNOSIS — Z1231 Encounter for screening mammogram for malignant neoplasm of breast: Secondary | ICD-10-CM

## 2018-01-03 DIAGNOSIS — Z23 Encounter for immunization: Secondary | ICD-10-CM | POA: Diagnosis not present

## 2018-01-03 DIAGNOSIS — M7552 Bursitis of left shoulder: Secondary | ICD-10-CM | POA: Diagnosis not present

## 2018-01-16 ENCOUNTER — Ambulatory Visit
Admission: RE | Admit: 2018-01-16 | Discharge: 2018-01-16 | Disposition: A | Discharge status: Home / Self Care | Payer: Medicare Other | Source: Ambulatory Visit | Attending: Internal Medicine | Admitting: Internal Medicine

## 2018-01-16 DIAGNOSIS — Z1231 Encounter for screening mammogram for malignant neoplasm of breast: Secondary | ICD-10-CM | POA: Diagnosis not present

## 2018-01-24 DIAGNOSIS — M7552 Bursitis of left shoulder: Secondary | ICD-10-CM | POA: Diagnosis not present

## 2018-01-24 DIAGNOSIS — I10 Essential (primary) hypertension: Secondary | ICD-10-CM | POA: Diagnosis not present

## 2018-04-04 DIAGNOSIS — M75121 Complete rotator cuff tear or rupture of right shoulder, not specified as traumatic: Secondary | ICD-10-CM | POA: Diagnosis not present

## 2018-04-04 DIAGNOSIS — L309 Dermatitis, unspecified: Secondary | ICD-10-CM | POA: Diagnosis not present

## 2018-05-07 DIAGNOSIS — M75121 Complete rotator cuff tear or rupture of right shoulder, not specified as traumatic: Secondary | ICD-10-CM | POA: Diagnosis not present

## 2018-10-18 DIAGNOSIS — C4491 Basal cell carcinoma of skin, unspecified: Secondary | ICD-10-CM | POA: Diagnosis not present

## 2018-10-18 DIAGNOSIS — R030 Elevated blood-pressure reading, without diagnosis of hypertension: Secondary | ICD-10-CM | POA: Diagnosis not present

## 2018-10-18 DIAGNOSIS — I10 Essential (primary) hypertension: Secondary | ICD-10-CM | POA: Diagnosis not present

## 2018-10-18 DIAGNOSIS — K219 Gastro-esophageal reflux disease without esophagitis: Secondary | ICD-10-CM | POA: Diagnosis not present

## 2018-10-18 DIAGNOSIS — E039 Hypothyroidism, unspecified: Secondary | ICD-10-CM | POA: Diagnosis not present

## 2018-12-23 ENCOUNTER — Other Ambulatory Visit: Payer: Self-pay | Admitting: Internal Medicine

## 2018-12-23 DIAGNOSIS — Z1231 Encounter for screening mammogram for malignant neoplasm of breast: Secondary | ICD-10-CM

## 2019-01-27 ENCOUNTER — Ambulatory Visit: Payer: Medicare Other

## 2019-04-03 ENCOUNTER — Other Ambulatory Visit: Payer: Self-pay

## 2019-04-03 ENCOUNTER — Ambulatory Visit
Admission: RE | Admit: 2019-04-03 | Discharge: 2019-04-03 | Disposition: A | Discharge status: Home / Self Care | Payer: Medicare Other | Source: Ambulatory Visit | Attending: Internal Medicine | Admitting: Internal Medicine

## 2019-04-03 DIAGNOSIS — Z1231 Encounter for screening mammogram for malignant neoplasm of breast: Secondary | ICD-10-CM | POA: Diagnosis not present

## 2019-04-25 DIAGNOSIS — Z1331 Encounter for screening for depression: Secondary | ICD-10-CM | POA: Diagnosis not present

## 2019-04-25 DIAGNOSIS — M19049 Primary osteoarthritis, unspecified hand: Secondary | ICD-10-CM | POA: Diagnosis not present

## 2019-04-25 DIAGNOSIS — E7800 Pure hypercholesterolemia, unspecified: Secondary | ICD-10-CM | POA: Diagnosis not present

## 2019-04-25 DIAGNOSIS — M479 Spondylosis, unspecified: Secondary | ICD-10-CM | POA: Diagnosis not present

## 2019-04-25 DIAGNOSIS — I10 Essential (primary) hypertension: Secondary | ICD-10-CM | POA: Diagnosis not present

## 2019-05-26 ENCOUNTER — Ambulatory Visit: Payer: Medicare Other | Admitting: Family Medicine

## 2019-07-14 DIAGNOSIS — Z23 Encounter for immunization: Secondary | ICD-10-CM | POA: Diagnosis not present

## 2019-08-04 DIAGNOSIS — Z23 Encounter for immunization: Secondary | ICD-10-CM | POA: Diagnosis not present

## 2019-10-21 DIAGNOSIS — E7800 Pure hypercholesterolemia, unspecified: Secondary | ICD-10-CM | POA: Diagnosis not present

## 2019-10-21 DIAGNOSIS — E038 Other specified hypothyroidism: Secondary | ICD-10-CM | POA: Diagnosis not present

## 2019-10-24 DIAGNOSIS — E039 Hypothyroidism, unspecified: Secondary | ICD-10-CM | POA: Diagnosis not present

## 2019-10-24 DIAGNOSIS — R32 Unspecified urinary incontinence: Secondary | ICD-10-CM | POA: Diagnosis not present

## 2019-10-24 DIAGNOSIS — Z0000 Encounter for general adult medical examination without abnormal findings: Secondary | ICD-10-CM | POA: Diagnosis not present

## 2019-10-24 DIAGNOSIS — N1831 Chronic kidney disease, stage 3a: Secondary | ICD-10-CM | POA: Diagnosis not present

## 2019-10-24 DIAGNOSIS — I10 Essential (primary) hypertension: Secondary | ICD-10-CM | POA: Diagnosis not present

## 2019-10-24 DIAGNOSIS — M19049 Primary osteoarthritis, unspecified hand: Secondary | ICD-10-CM | POA: Diagnosis not present

## 2019-10-24 DIAGNOSIS — F4320 Adjustment disorder, unspecified: Secondary | ICD-10-CM | POA: Diagnosis not present

## 2019-10-24 DIAGNOSIS — Z1212 Encounter for screening for malignant neoplasm of rectum: Secondary | ICD-10-CM | POA: Diagnosis not present

## 2019-10-24 DIAGNOSIS — E7800 Pure hypercholesterolemia, unspecified: Secondary | ICD-10-CM | POA: Diagnosis not present

## 2019-10-29 ENCOUNTER — Other Ambulatory Visit: Payer: Self-pay | Admitting: Internal Medicine

## 2019-10-29 DIAGNOSIS — Z0000 Encounter for general adult medical examination without abnormal findings: Secondary | ICD-10-CM

## 2019-11-03 ENCOUNTER — Other Ambulatory Visit: Payer: Self-pay | Admitting: Internal Medicine

## 2019-11-03 DIAGNOSIS — E2839 Other primary ovarian failure: Secondary | ICD-10-CM

## 2020-01-12 ENCOUNTER — Other Ambulatory Visit: Payer: Self-pay

## 2020-01-12 ENCOUNTER — Encounter (HOSPITAL_BASED_OUTPATIENT_CLINIC_OR_DEPARTMENT_OTHER): Payer: Self-pay

## 2020-01-12 ENCOUNTER — Emergency Department (HOSPITAL_BASED_OUTPATIENT_CLINIC_OR_DEPARTMENT_OTHER): Payer: Medicare Other

## 2020-01-12 ENCOUNTER — Emergency Department (HOSPITAL_BASED_OUTPATIENT_CLINIC_OR_DEPARTMENT_OTHER)
Admission: EM | Admit: 2020-01-12 | Discharge: 2020-01-12 | Disposition: A | Discharge status: Home / Self Care | Payer: Medicare Other | Attending: Emergency Medicine | Admitting: Emergency Medicine

## 2020-01-12 DIAGNOSIS — Z79899 Other long term (current) drug therapy: Secondary | ICD-10-CM | POA: Insufficient documentation

## 2020-01-12 DIAGNOSIS — I10 Essential (primary) hypertension: Secondary | ICD-10-CM | POA: Diagnosis not present

## 2020-01-12 DIAGNOSIS — Z85828 Personal history of other malignant neoplasm of skin: Secondary | ICD-10-CM | POA: Diagnosis not present

## 2020-01-12 DIAGNOSIS — R42 Dizziness and giddiness: Secondary | ICD-10-CM | POA: Diagnosis not present

## 2020-01-12 DIAGNOSIS — E039 Hypothyroidism, unspecified: Secondary | ICD-10-CM | POA: Insufficient documentation

## 2020-01-12 DIAGNOSIS — R079 Chest pain, unspecified: Secondary | ICD-10-CM | POA: Diagnosis not present

## 2020-01-12 DIAGNOSIS — R0789 Other chest pain: Secondary | ICD-10-CM | POA: Insufficient documentation

## 2020-01-12 LAB — TROPONIN I (HIGH SENSITIVITY): Troponin I (High Sensitivity): 3 ng/L (ref <18)

## 2020-01-12 LAB — CBC
HCT: 43.1 % (ref 36.0–46.0)
Hemoglobin: 14.4 g/dL (ref 12.0–15.0)
MCH: 30.4 pg (ref 26.0–34.0)
MCHC: 33.4 g/dL (ref 30.0–36.0)
MCV: 91.1 fL (ref 80.0–100.0)
Platelets: 300 10*3/uL (ref 150–400)
RBC: 4.73 MIL/uL (ref 3.87–5.11)
RDW: 13.1 % (ref 11.5–15.5)
WBC: 10.3 10*3/uL (ref 4.0–10.5)
nRBC: 0 % (ref 0.0–0.2)

## 2020-01-12 LAB — BASIC METABOLIC PANEL
Anion gap: 10 (ref 5–15)
BUN: 13 mg/dL (ref 8–23)
CO2: 25 mmol/L (ref 22–32)
Calcium: 9.9 mg/dL (ref 8.9–10.3)
Chloride: 104 mmol/L (ref 98–111)
Creatinine, Ser: 0.91 mg/dL (ref 0.44–1.00)
GFR, Estimated: >60 mL/min (ref >60)
Glucose, Bld: 92 mg/dL (ref 70–99)
Potassium: 3.4 mmol/L — ABNORMAL LOW (ref 3.5–5.1)
Sodium: 139 mmol/L (ref 135–145)

## 2020-01-12 MED ORDER — MECLIZINE HCL 25 MG PO TABS: 25.0000 mg | ORAL_TABLET | Freq: Three times a day (TID) | ORAL | 0 refills | Status: DC | PRN

## 2020-01-12 NOTE — ED Notes (Signed)
States left side chest pressure off and on since friday

## 2020-01-12 NOTE — ED Provider Notes (Signed)
[facility] EMERGENCY DEPARTMENT Provider Note  CSN: [id number] Arrival date & time: 01/12/20 1524    History Chief Complaint  Patient presents with  . Chest Pain    HPI  Abigail Robinson is a 69 y.o. female with history of HTN reports she had a cold last week, Thursday night she began to have some room spinning dizziness, similar to previous episodes. She reports symptoms essentially resolved by the next day but she had noticed her BP higher than usual at home over the weekend. She had a mild left sided headache and L chest 'pressure' 2 days ago which she attributed to stopping caffeine. She tried to contact her PCP today regarding her BP but did not hear back and so she came to the ED for evaluation. No CP or dizziness today. She otherwise is asymptomatic.    Past Medical History:  Diagnosis Date  . Allergy   . Arthritis    back, hands  . Cancer ([place])    skin on face  . Complication of anesthesia    prolonged sedation, slow to wake up  . Endometriosis   . GERD (gastroesophageal reflux disease)   . Headache    Migraines  . Hypertension   . Hypothyroidism   . Thyroid disease   . Vertigo     Past Surgical History:  Procedure Laterality Date  . ANTERIOR AND POSTERIOR REPAIR WITH SACROSPINOUS FIXATION N/A 07/08/2015   Procedure: ANTERIOR AND POSTERIOR REPAIR ;  Surgeon: Louretta Shorten, MD;  Location: [facility];  Service: Gynecology;  Laterality: N/A;  . COLONOSCOPY    . DIAGNOSTIC LAPAROSCOPY    . LAPAROSCOPIC VAGINAL HYSTERECTOMY WITH SALPINGO OOPHORECTOMY Bilateral 07/08/2015   Procedure: LAPAROSCOPIC ASSISTED VAGINAL HYSTERECTOMY WITH BILATERAL SALPINGO OOPHORECTOMY;  Surgeon: Louretta Shorten, MD;  Location: [facility];  Service: Gynecology;  Laterality: Bilateral;  . OOPHORECTOMY  age 46   for endometrosis    Family History  Problem Relation Age of Onset  . Liver cancer Mother   . Kidney cancer Mother   . Heart disease Father   . Colon cancer Neg Hx   . Esophageal  cancer Neg Hx   . Stomach cancer Neg Hx     Social History   Tobacco Use  . Smoking status: Never Smoker  . Smokeless tobacco: Never Used  Vaping Use  . Vaping Use: Never used  Substance Use Topics  . Alcohol use: Not Currently  . Drug use: No     Home Medications Prior to Admission medications   Medication Sig Start Date End Date Taking? Authorizing Provider  acetaminophen (TYLENOL) 500 MG tablet Take 1,000 mg by mouth 2 (two) times daily as needed for moderate pain or headache.     [provider]  atenolol (TENORMIN) 50 MG tablet Take 1 tablet by mouth Daily. 10/24/11   [provider]  Calcium Carb-Cholecalciferol 500-600 MG-UNIT TABS Take 1 tablet by mouth daily.    [provider]  ibuprofen (ADVIL,MOTRIN) 200 MG tablet Take 400 mg by mouth daily as needed for mild pain.    [provider]  levothyroxine (SYNTHROID, LEVOTHROID) 75 MCG tablet Take 75 mcg by mouth at bedtime.     [provider]  losartan (COZAAR) 100 MG tablet Take 100 mg by mouth daily.    [provider]  meclizine (ANTIVERT) 25 MG tablet Take 1 tablet (25 mg total) by mouth 3 (three) times daily as needed for dizziness. 01/12/20   Truddie Hidden, MD  metoprolol succinate (TOPROL-XL) 50 MG 24 hr tablet  10/22/15   [provider]  omeprazole (PRILOSEC) 20 MG capsule Take 20 mg by mouth daily as needed (for heartburn).     [provider]  Probiotic Product (PROBIOTIC PO) Take 1 capsule by mouth daily. Ultimate 16 strain w/ trace minerals & sos    [provider]  vitamin B-12 (CYANOCOBALAMIN) 1000 MCG tablet Take 1,000 mcg by mouth daily.    [provider]  Vitamins A & D (VITAMIN A & D) 5000-400 UNITS CAPS Take 1 capsule by mouth daily.    [provider]     Allergies    Demerol [meperidine]   Review of Systems   Review of Systems A comprehensive review of systems was completed and negative except as  noted in HPI.    Physical Exam BP (!) 179/106 (BP Location: Left Arm)   Pulse 65   Temp 97.9 F (36.6 C) (Oral)   Resp 18   Ht 5\' 7"  (1.702 m)   Wt 87.1 kg   SpO2 98%   BMI 30.07 kg/m   Physical Exam Vitals and nursing note reviewed.  Constitutional:      Appearance: Normal appearance.  HENT:     Head: Normocephalic and atraumatic.     Nose: Nose normal.     Mouth/Throat:     Mouth: Mucous membranes are moist.  Eyes:     Extraocular Movements: Extraocular movements intact.     Conjunctiva/sclera: Conjunctivae normal.  Cardiovascular:     Rate and Rhythm: Normal rate.  Pulmonary:     Effort: Pulmonary effort is normal.     Breath sounds: Normal breath sounds.  Abdominal:     General: Abdomen is flat.     Palpations: Abdomen is soft.     Tenderness: There is no abdominal tenderness.  Musculoskeletal:        General: No swelling. Normal range of motion.     Cervical back: Neck supple.  Skin:    General: Skin is warm and dry.  Neurological:     General: No focal deficit present.     Mental Status: She is alert.  Psychiatric:        Mood and Affect: Mood normal.      ED Results / Procedures / Treatments   Labs (all labs ordered are listed, but only abnormal results are displayed) Labs Reviewed  BASIC METABOLIC PANEL - Abnormal; Notable for the following components:      Result Value   Potassium 3.4 (*)    All other components within normal limits  CBC  TROPONIN I (HIGH SENSITIVITY)  TROPONIN I (HIGH SENSITIVITY)    EKG EKG Interpretation  Date/Time:  Monday January 12 2020 15:31:38 EDT Ventricular Rate:  57 PR Interval:  160 QRS Duration: 82 QT Interval:  428 QTC Calculation: 416 R Axis:   7 Text Interpretation: Sinus bradycardia Low voltage QRS Cannot rule out Anterior infarct , age undetermined T wave inversion in III Abnormal ECG Since last tracing Nonspecific T wave abnormality NOW PRESENT Confirmed by Calvert Cantor [phone]) on 01/12/2020  3:55:02 PM    Radiology DG Chest 2 View  Result Date: 01/12/2020 CLINICAL DATA:  Chest pain EXAM: CHEST - 2 VIEW COMPARISON:  07/09/2014 FINDINGS: The heart size and mediastinal contours are within normal limits. Both lungs are clear. Mild degenerative changes of the spine. IMPRESSION: No active cardiopulmonary disease. Electronically Signed   By: Donavan Foil M.D.   On: 01/12/2020  15:54    Procedures Procedures  Medications Ordered in the ED Medications - No data to display   MDM Rules/Calculators/A&P MDM Patient with positional vertigo, likely BPV from labyrinthitis also has had mildly elevated BP at home the last few days. She took a claritin last week but not with a decongestant, otherwise no recent medication changes. She has normal EKG, CXR and labs in the ED. BP has improved since arrival without intervention. She has not signs of end organ damage. She would like to go home with outpatient PCP follow up. Recommend she keep a log of BP to facilitate any medication adjustments. She is also requesting a refill of her old Meclizine in case her vertigo comes back.  ED Course  I have reviewed the triage vital signs and the nursing notes.  Pertinent labs & imaging results that were available during my care of the patient were reviewed by me and considered in my medical decision making (see chart for details).     Final Clinical Impression(s) / ED Diagnoses Final diagnoses:  Atypical chest pain  Vertigo  Hypertension, unspecified type    Rx / DC Orders ED Discharge Orders         Ordered    meclizine (ANTIVERT) 25 MG tablet  3 times daily PRN        01/12/20 1705           Truddie Hidden, MD 01/12/20 1705

## 2020-01-12 NOTE — ED Triage Notes (Addendum)
Pt c/o CP/vertigo x 2 days-NAD-steady gait

## 2020-01-14 DIAGNOSIS — H8110 Benign paroxysmal vertigo, unspecified ear: Secondary | ICD-10-CM | POA: Diagnosis not present

## 2020-01-14 DIAGNOSIS — Z23 Encounter for immunization: Secondary | ICD-10-CM | POA: Diagnosis not present

## 2020-01-14 DIAGNOSIS — R079 Chest pain, unspecified: Secondary | ICD-10-CM | POA: Diagnosis not present

## 2020-01-14 DIAGNOSIS — I10 Essential (primary) hypertension: Secondary | ICD-10-CM | POA: Diagnosis not present

## 2020-01-22 DIAGNOSIS — L814 Other melanin hyperpigmentation: Secondary | ICD-10-CM | POA: Diagnosis not present

## 2020-01-22 DIAGNOSIS — L905 Scar conditions and fibrosis of skin: Secondary | ICD-10-CM | POA: Diagnosis not present

## 2020-01-22 DIAGNOSIS — D2271 Melanocytic nevi of right lower limb, including hip: Secondary | ICD-10-CM | POA: Diagnosis not present

## 2020-01-22 DIAGNOSIS — D2261 Melanocytic nevi of right upper limb, including shoulder: Secondary | ICD-10-CM | POA: Diagnosis not present

## 2020-01-22 DIAGNOSIS — L821 Other seborrheic keratosis: Secondary | ICD-10-CM | POA: Diagnosis not present

## 2020-01-22 DIAGNOSIS — D1801 Hemangioma of skin and subcutaneous tissue: Secondary | ICD-10-CM | POA: Diagnosis not present

## 2020-01-22 DIAGNOSIS — L738 Other specified follicular disorders: Secondary | ICD-10-CM | POA: Diagnosis not present

## 2020-01-22 DIAGNOSIS — D225 Melanocytic nevi of trunk: Secondary | ICD-10-CM | POA: Diagnosis not present

## 2020-01-22 DIAGNOSIS — I788 Other diseases of capillaries: Secondary | ICD-10-CM | POA: Diagnosis not present

## 2020-01-22 DIAGNOSIS — L820 Inflamed seborrheic keratosis: Secondary | ICD-10-CM | POA: Diagnosis not present

## 2020-02-16 ENCOUNTER — Encounter: Payer: Self-pay | Admitting: Family Medicine

## 2020-02-16 ENCOUNTER — Other Ambulatory Visit: Payer: Self-pay

## 2020-02-16 ENCOUNTER — Ambulatory Visit (INDEPENDENT_AMBULATORY_CARE_PROVIDER_SITE_OTHER): Payer: Medicare Other

## 2020-02-16 ENCOUNTER — Telehealth: Payer: Self-pay | Admitting: Family Medicine

## 2020-02-16 ENCOUNTER — Ambulatory Visit (INDEPENDENT_AMBULATORY_CARE_PROVIDER_SITE_OTHER): Payer: Medicare Other | Admitting: Family Medicine

## 2020-02-16 VITALS — BP 140/80 | HR 61 | Ht 67.0 in | Wt 194.0 lb

## 2020-02-16 DIAGNOSIS — G8929 Other chronic pain: Secondary | ICD-10-CM

## 2020-02-16 DIAGNOSIS — M542 Cervicalgia: Secondary | ICD-10-CM | POA: Diagnosis not present

## 2020-02-16 DIAGNOSIS — M5030 Other cervical disc degeneration, unspecified cervical region: Secondary | ICD-10-CM

## 2020-02-16 MED ORDER — TIZANIDINE HCL 2 MG PO TABS: 2.0000 mg | ORAL_TABLET | Freq: Every day | ORAL | 0 refills | Status: DC

## 2020-02-16 NOTE — Assessment & Plan Note (Signed)
Degenerative disc disease is most likely.  We discussed with patient on different treatment options.  Patient wanted to try a low-dose of a muscle relaxer with her having some improvement.  Will consider the possibility of gabapentin at follow-up.  Home exercises, icing regimen, which activities to do which wants to avoid.  Patient will hold on formal physical therapy at this time.  If patient has worsening symptoms I would like to refer her.  Follow-up again in 5 to 6 weeks patient could also be a candidate for manipulation if necessary.

## 2020-02-16 NOTE — Telephone Encounter (Signed)
Pt forgot to mention she has been suffering with TMJ for about 1 month. She wanted to mention it in case it had any bearing on her neck pain.

## 2020-02-16 NOTE — Patient Instructions (Addendum)
Good to see you Neck xray today Exercise 3 times a week Vitamin D 2,000 IUs daily Tart Cherry extract 1200 mg at night Zanaflex 2 mg at night Throw away flexeril Ice 20 mins 2 times daily See me again in 6 weeks

## 2020-02-16 NOTE — Progress Notes (Signed)
[facility] [address] [place] Phone: [phone] Subjective:   I Abigail Robinson am serving as a Education administrator for Dr. Hulan Saas.  This visit occurred during the SARS-CoV-2 public health emergency.  Safety protocols were in place, including screening questions prior to the visit, additional usage of staff PPE, and extensive cleaning of exam room while observing appropriate contact time as indicated for disinfecting solutions.   I'm seeing this patient by the request  of:  Michael Boston, MD  CC: Neck and shoulder pain:  [id number]  Abigail Robinson is a 69 y.o. female coming in with complaint of left sided neck and shoulder pain. Patient states the pain is mostly in her neck. Pain for about 2 months. Neck is stiff in the morning. Believes it is arthritis. Pain radiates to the upper traps and she states she feels lumps in her muscle. States she has had an headache for the past 2 days. Has been taking muscle relaxer for pain. 6/10 at its worse. Pain is worse in the morning. Laying on her back with a pillow under her neck causes less pain.      Patient did have x-rays of the chest done on January 12, 2020.  These were independently visualized by me showing some mild degenerative changes of the thoracic spine  Past Medical History:  Diagnosis Date  . Allergy   . Arthritis    back, hands  . Cancer ([place])    skin on face  . Complication of anesthesia    prolonged sedation, slow to wake up  . Endometriosis   . GERD (gastroesophageal reflux disease)   . Headache    Migraines  . Hypertension   . Hypothyroidism   . Thyroid disease   . Vertigo    Past Surgical History:  Procedure Laterality Date  . ANTERIOR AND POSTERIOR REPAIR WITH SACROSPINOUS FIXATION N/A 07/08/2015   Procedure: ANTERIOR AND POSTERIOR REPAIR ;  Surgeon: Louretta Shorten, MD;  Location: [facility];  Service: Gynecology;  Laterality: N/A;  . COLONOSCOPY    . DIAGNOSTIC LAPAROSCOPY     . LAPAROSCOPIC VAGINAL HYSTERECTOMY WITH SALPINGO OOPHORECTOMY Bilateral 07/08/2015   Procedure: LAPAROSCOPIC ASSISTED VAGINAL HYSTERECTOMY WITH BILATERAL SALPINGO OOPHORECTOMY;  Surgeon: Louretta Shorten, MD;  Location: [facility];  Service: Gynecology;  Laterality: Bilateral;  . OOPHORECTOMY  age 43   for endometrosis   Social History   Socioeconomic History  . Marital status: Widowed    Spouse name: Not on file  . Number of children: Not on file  . Years of education: Not on file  . Highest education level: Not on file  Occupational History  . Not on file  Tobacco Use  . Smoking status: Never Smoker  . Smokeless tobacco: Never Used  Vaping Use  . Vaping Use: Never used  Substance and Sexual Activity  . Alcohol use: Not Currently  . Drug use: No  . Sexual activity: Not on file  Other Topics Concern  . Not on file  Social History Narrative  . Not on file   Social Determinants of Health   Financial Resource Strain:   . Difficulty of Paying Living Expenses: Not on file  Food Insecurity:   . Worried About Charity fundraiser in the Last Year: Not on file  . Ran Out of Food in the Last Year: Not on file  Transportation Needs:   . Lack of Transportation (Medical): Not on file  . Lack of Transportation (  Non-Medical): Not on file  Physical Activity:   . Days of Exercise per Week: Not on file  . Minutes of Exercise per Session: Not on file  Stress:   . Feeling of Stress : Not on file  Social Connections:   . Frequency of Communication with Friends and Family: Not on file  . Frequency of Social Gatherings with Friends and Family: Not on file  . Attends Religious Services: Not on file  . Active Member of Clubs or Organizations: Not on file  . Attends Archivist Meetings: Not on file  . Marital Status: Not on file   Allergies  Allergen Reactions  . Demerol [Meperidine] Nausea Only and Other (See Comments)    Reaction=headache   Family History  Problem Relation Age  of Onset  . Liver cancer Mother   . Kidney cancer Mother   . Heart disease Father   . Colon cancer Neg Hx   . Esophageal cancer Neg Hx   . Stomach cancer Neg Hx     Current Outpatient Medications (Endocrine & Metabolic):  .  levothyroxine (SYNTHROID, LEVOTHROID) 75 MCG tablet, Take 75 mcg by mouth at bedtime.    Current Outpatient Medications (Cardiovascular):  .  atenolol (TENORMIN) 50 MG tablet, Take 1 tablet by mouth Daily. Marland Kitchen  losartan (COZAAR) 100 MG tablet, Take 100 mg by mouth daily. .  metoprolol succinate (TOPROL-XL) 50 MG 24 hr tablet,      Current Outpatient Medications (Analgesics):  .  acetaminophen (TYLENOL) 500 MG tablet, Take 1,000 mg by mouth 2 (two) times daily as needed for moderate pain or headache.  .  ibuprofen (ADVIL,MOTRIN) 200 MG tablet, Take 400 mg by mouth daily as needed for mild pain.   Current Outpatient Medications (Hematological):  .  vitamin B-12 (CYANOCOBALAMIN) 1000 MCG tablet, Take 1,000 mcg by mouth daily.   Current Outpatient Medications (Other):  Marland Kitchen  Calcium Carb-Cholecalciferol 500-600 MG-UNIT TABS, Take 1 tablet by mouth daily. .  meclizine (ANTIVERT) 25 MG tablet, Take 1 tablet (25 mg total) by mouth 3 (three) times daily as needed for dizziness. Marland Kitchen  omeprazole (PRILOSEC) 20 MG capsule, Take 20 mg by mouth daily as needed (for heartburn).  .  Probiotic Product (PROBIOTIC PO), Take 1 capsule by mouth daily. Ultimate 16 strain w/ trace minerals & sos .  Vitamins A & D (VITAMIN A & D) 5000-400 UNITS CAPS, Take 1 capsule by mouth daily. Marland Kitchen  tiZANidine (ZANAFLEX) 2 MG tablet, Take 1 tablet (2 mg total) by mouth at bedtime.  Current Facility-Administered Medications (Other):  .  0.9 %  sodium chloride infusion   Reviewed prior external information including notes and imaging from  primary care provider As well as notes that were available from care everywhere and other healthcare systems.  Past medical history, social, surgical and family  history all reviewed in electronic medical record.  No pertanent information unless stated regarding to the chief complaint.   Review of Systems:  No  visual changes, nausea, vomiting, diarrhea, constipation, dizziness, abdominal pain, skin rash, fevers, chills, night sweats, weight loss, swollen lymph nodes, body aches, joint swelling, chest pain, shortness of breath, mood changes. POSITIVE muscle aches mild headache  Objective  Blood pressure 140/80, pulse 61, height 5\' 7"  (1.702 m), weight 194 lb (88 kg), SpO2 95 %.   General: No apparent distress alert and oriented x3 mood and affect normal, dressed appropriately.  HEENT: Pupils equal, extraocular movements intact  Respiratory: Patient's speak in full sentences and  does not appear short of breath  Cardiovascular: No lower extremity edema, non tender, no erythema  Patient does have some arthritic changes of the hands noted.  Patient's neck does have some mild loss of lordosis.  Lacks last 5 degrees of flexion and lacks 10 degrees of extension.  Negative Spurling's.  Very minimal sidebending.  Mild crepitus noted.  5 out of 5 strength of the upper extremities.  Mild increase in kyphosis of the upper thoracic spine   [id number]; 15 additional minutes spent for Therapeutic exercises as stated in above notes.  This included exercises focusing on stretching, strengthening, with significant focus on eccentric aspects.   Long term goals include an improvement in range of motion, strength, endurance as well as avoiding reinjury. Patient's frequency would include in 1-2 times a day, 3-5 times a week for a duration of 6-12 weeks. Exercises that included:  Basic scapular stabilization to include adduction and depression of scapula Scaption, focusing on proper movement and good control Internal and External rotation utilizing a theraband, with elbow tucked at side entire time Rows with theraband    Proper technique shown and discussed handout in great detail  with ATC.  All questions were discussed and answered.     Impression and Recommendations:     The above documentation has been reviewed and is accurate and complete Lyndal Pulley, DO

## 2020-02-17 ENCOUNTER — Ambulatory Visit
Admission: RE | Admit: 2020-02-17 | Discharge: 2020-02-17 | Disposition: A | Discharge status: Home / Self Care | Payer: Medicare Other | Source: Ambulatory Visit | Attending: Internal Medicine | Admitting: Internal Medicine

## 2020-02-17 DIAGNOSIS — M810 Age-related osteoporosis without current pathological fracture: Secondary | ICD-10-CM | POA: Diagnosis not present

## 2020-02-17 DIAGNOSIS — Z780 Asymptomatic menopausal state: Secondary | ICD-10-CM | POA: Diagnosis not present

## 2020-02-17 DIAGNOSIS — E2839 Other primary ovarian failure: Secondary | ICD-10-CM

## 2020-03-26 NOTE — Progress Notes (Signed)
[facility] [place] Phone: [phone] Subjective:   Fontaine No, am serving as a scribe for Dr. Hulan Saas. This visit occurred during the SARS-CoV-2 public health emergency.  Safety protocols were in place, including screening questions prior to the visit, additional usage of staff PPE, and extensive cleaning of exam room while observing appropriate contact time as indicated for disinfecting solutions.   I'm seeing this patient by the request  of:  Michael Boston, MD  CC: Neck pain follow-up  [id number]   02/16/2020 Degenerative disc disease is most likely.  We discussed with patient on different treatment options.  Patient wanted to try a low-dose of a muscle relaxer with her having some improvement.  Will consider the possibility of gabapentin at follow-up.  Home exercises, icing regimen, which activities to do which wants to avoid.  Patient will hold on formal physical therapy at this time.  If patient has worsening symptoms I would like to refer her.  Follow-up again in 5 to 6 weeks patient could also be a candidate for manipulation if necessary.   Update 03/29/2020 KEYANAH KOZICKI is a 70 y.o. female coming in with complaint of cervical spine pain. Is doing slightly better. Able to make it through Christmas with a few aches and pains. Continues to have a knot in left cervical spine. Pain improves with relaxation.  Patient states that the muscle relaxer was helpful.  Patient did have a moment where she felt like she was getting a little more depressed secondary to the first Christmas without her husband.  Patient discontinued the medicine.  Patient is wondering if it is okay to take or not.  Does state when she was on it it did help out.  Neck xray 02/16/2020  IMPRESSION: Moderate multilevel degenerative disc disease. Mild bilateral neural foraminal stenosis is noted at C5-6 and C6-7 secondary to uncovertebral  spurring.  Bone Density 02/17/2020 ASSESSMENT: The probability of a major osteoporotic fracture is 10.2 % within the next ten years.  The probability of a hip fracture is 1.7 % within the next ten years.      Past Medical History:  Diagnosis Date   Allergy    Arthritis    back, hands   Cancer ([place])    skin on face   Complication of anesthesia    prolonged sedation, slow to wake up   Endometriosis    GERD (gastroesophageal reflux disease)    Headache    Migraines   Hypertension    Hypothyroidism    Thyroid disease    Vertigo    Past Surgical History:  Procedure Laterality Date   ANTERIOR AND POSTERIOR REPAIR WITH SACROSPINOUS FIXATION N/A 07/08/2015   Procedure: ANTERIOR AND POSTERIOR REPAIR ;  Surgeon: Louretta Shorten, MD;  Location: [facility];  Service: Gynecology;  Laterality: N/A;   COLONOSCOPY     DIAGNOSTIC LAPAROSCOPY     LAPAROSCOPIC VAGINAL HYSTERECTOMY WITH SALPINGO OOPHORECTOMY Bilateral 07/08/2015   Procedure: LAPAROSCOPIC ASSISTED VAGINAL HYSTERECTOMY WITH BILATERAL SALPINGO OOPHORECTOMY;  Surgeon: Louretta Shorten, MD;  Location: [facility];  Service: Gynecology;  Laterality: Bilateral;   OOPHORECTOMY  age 12   for endometrosis   Social History   Socioeconomic History   Marital status: Widowed    Spouse name: Not on file   Number of children: Not on file   Years of education: Not on file   Highest education level: Not on file  Occupational History   Not  on file  Tobacco Use   Smoking status: Never Smoker   Smokeless tobacco: Never Used  Vaping Use   Vaping Use: Never used  Substance and Sexual Activity   Alcohol use: Not Currently   Drug use: No   Sexual activity: Not on file  Other Topics Concern   Not on file  Social History Narrative   Not on file   Social Determinants of Health   Financial Resource Strain: Not on file  Food Insecurity: Not on file  Transportation Needs: Not on file  Physical Activity: Not on file   Stress: Not on file  Social Connections: Not on file   Allergies  Allergen Reactions   Demerol [Meperidine] Nausea Only and Other (See Comments)    Reaction=headache   Family History  Problem Relation Age of Onset   Liver cancer Mother    Kidney cancer Mother    Heart disease Father    Colon cancer Neg Hx    Esophageal cancer Neg Hx    Stomach cancer Neg Hx     Current Outpatient Medications (Endocrine & Metabolic):    levothyroxine (SYNTHROID, LEVOTHROID) 75 MCG tablet, Take 75 mcg by mouth at bedtime.    Current Outpatient Medications (Cardiovascular):    atenolol (TENORMIN) 50 MG tablet, Take 1 tablet by mouth Daily.   losartan (COZAAR) 100 MG tablet, Take 100 mg by mouth daily.   metoprolol succinate (TOPROL-XL) 50 MG 24 hr tablet,      Current Outpatient Medications (Analgesics):    acetaminophen (TYLENOL) 500 MG tablet, Take 1,000 mg by mouth 2 (two) times daily as needed for moderate pain or headache.    ibuprofen (ADVIL,MOTRIN) 200 MG tablet, Take 400 mg by mouth daily as needed for mild pain.   Current Outpatient Medications (Hematological):    vitamin B-12 (CYANOCOBALAMIN) 1000 MCG tablet, Take 1,000 mcg by mouth daily.   Current Outpatient Medications (Other):    Calcium Carb-Cholecalciferol 500-600 MG-UNIT TABS, Take 1 tablet by mouth daily.   meclizine (ANTIVERT) 25 MG tablet, Take 1 tablet (25 mg total) by mouth 3 (three) times daily as needed for dizziness.   omeprazole (PRILOSEC) 20 MG capsule, Take 20 mg by mouth daily as needed (for heartburn).    Probiotic Product (PROBIOTIC PO), Take 1 capsule by mouth daily. Ultimate 16 strain w/ trace minerals & sos   tiZANidine (ZANAFLEX) 2 MG tablet, Take 1 tablet (2 mg total) by mouth at bedtime.   Vitamins A & D (VITAMIN A & D) 5000-400 UNITS CAPS, Take 1 capsule by mouth daily.  Current Facility-Administered Medications (Other):    0.9 %  sodium chloride infusion   Reviewed prior  external information including notes and imaging from  primary care provider As well as notes that were available from care everywhere and other healthcare systems.  Past medical history, social, surgical and family history all reviewed in electronic medical record.  No pertanent information unless stated regarding to the chief complaint.   Review of Systems:  No headache, visual changes, nausea, vomiting, diarrhea, constipation, dizziness, abdominal pain, skin rash, fevers, chills, night sweats, weight loss, swollen lymph nodes, , joint swelling, chest pain, shortness of breath, mood changes. POSITIVE muscle aches, body aches  Objective  Blood pressure 132/88, pulse 62, height 5\' 7"  (1.702 m), weight 195 lb (88.5 kg), SpO2 98 %.   General: No apparent distress alert and oriented x3 mood and affect normal, dressed appropriately.  HEENT: Pupils equal, extraocular movements intact  Respiratory: Patient's speak  in full sentences and does not appear short of breath  Cardiovascular: No lower extremity edema, non tender, no erythema  Neck exam does show that patient has some loss of lordosis.  Tender to palpation over the left paraspinal musculature mostly at the C2 and C7 on the left.  Lacks last 5 degrees of extension in the last 5 degrees of flexion negative Spurling's.  5-5 strength of the upper extremities      Impression and Recommendations:     The above documentation has been reviewed and is accurate and complete Lyndal Pulley, DO

## 2020-03-29 ENCOUNTER — Other Ambulatory Visit: Payer: Self-pay

## 2020-03-29 ENCOUNTER — Ambulatory Visit (INDEPENDENT_AMBULATORY_CARE_PROVIDER_SITE_OTHER): Payer: Medicare Other | Admitting: Family Medicine

## 2020-03-29 ENCOUNTER — Encounter: Payer: Self-pay | Admitting: Family Medicine

## 2020-03-29 VITALS — BP 132/88 | HR 62 | Ht 67.0 in | Wt 195.0 lb

## 2020-03-29 DIAGNOSIS — M5030 Other cervical disc degeneration, unspecified cervical region: Secondary | ICD-10-CM | POA: Diagnosis not present

## 2020-03-29 NOTE — Assessment & Plan Note (Signed)
Patient has made some improvement.  Patient has taken the muscle relaxer occasionally.  We discussed using it more on an as-needed basis.  Patient states it did help but feels like it could have caused her some sadness.  This could have been during the holidays and thinking of her husband.  We discussed the potential for Cymbalta.  Patient did not want to make any changes at this time.  Patient will go start with formal physical therapy which I think can be significantly beneficial working on other modalities such as iontophoresis or dry needling.  Follow-up with me again in 6 to 8 weeks

## 2020-03-29 NOTE — Patient Instructions (Signed)
Change Tylenol to 500mg  3x a day Use mm relaxer when needed Read about Cymbalta PT [place] See me in 6-8 weeks

## 2020-05-03 ENCOUNTER — Encounter: Payer: Self-pay | Admitting: Family Medicine

## 2020-05-03 DIAGNOSIS — M542 Cervicalgia: Secondary | ICD-10-CM | POA: Diagnosis not present

## 2020-05-03 DIAGNOSIS — M5080 Other cervical disc disorders, unspecified cervical region: Secondary | ICD-10-CM | POA: Diagnosis not present

## 2020-05-06 DIAGNOSIS — M542 Cervicalgia: Secondary | ICD-10-CM | POA: Diagnosis not present

## 2020-05-06 DIAGNOSIS — N1831 Chronic kidney disease, stage 3a: Secondary | ICD-10-CM | POA: Diagnosis not present

## 2020-05-06 DIAGNOSIS — M5080 Other cervical disc disorders, unspecified cervical region: Secondary | ICD-10-CM | POA: Diagnosis not present

## 2020-05-06 DIAGNOSIS — E785 Hyperlipidemia, unspecified: Secondary | ICD-10-CM | POA: Diagnosis not present

## 2020-05-06 DIAGNOSIS — M19049 Primary osteoarthritis, unspecified hand: Secondary | ICD-10-CM | POA: Diagnosis not present

## 2020-05-06 DIAGNOSIS — I10 Essential (primary) hypertension: Secondary | ICD-10-CM | POA: Diagnosis not present

## 2020-05-06 DIAGNOSIS — H532 Diplopia: Secondary | ICD-10-CM | POA: Diagnosis not present

## 2020-05-07 ENCOUNTER — Other Ambulatory Visit: Payer: Self-pay | Admitting: Internal Medicine

## 2020-05-07 ENCOUNTER — Other Ambulatory Visit (HOSPITAL_COMMUNITY): Payer: Self-pay | Admitting: Internal Medicine

## 2020-05-07 DIAGNOSIS — H532 Diplopia: Secondary | ICD-10-CM

## 2020-05-10 ENCOUNTER — Ambulatory Visit (HOSPITAL_COMMUNITY)
Admission: RE | Admit: 2020-05-10 | Discharge: 2020-05-10 | Disposition: A | Discharge status: Home / Self Care | Payer: Medicare Other | Source: Ambulatory Visit | Attending: Internal Medicine | Admitting: Internal Medicine

## 2020-05-10 ENCOUNTER — Other Ambulatory Visit: Payer: Self-pay

## 2020-05-10 DIAGNOSIS — G9389 Other specified disorders of brain: Secondary | ICD-10-CM | POA: Diagnosis not present

## 2020-05-10 DIAGNOSIS — H532 Diplopia: Secondary | ICD-10-CM | POA: Insufficient documentation

## 2020-05-10 DIAGNOSIS — R4781 Slurred speech: Secondary | ICD-10-CM | POA: Diagnosis not present

## 2020-05-10 DIAGNOSIS — R220 Localized swelling, mass and lump, head: Secondary | ICD-10-CM | POA: Diagnosis not present

## 2020-05-10 MED ORDER — GADOBUTROL 1 MMOL/ML IV SOLN
9.0000 mL | Freq: Once | INTRAVENOUS | Status: AC | PRN
  Administered 2020-05-10: 9 mL via INTRAVENOUS

## 2020-05-11 DIAGNOSIS — M542 Cervicalgia: Secondary | ICD-10-CM | POA: Diagnosis not present

## 2020-05-11 DIAGNOSIS — M5080 Other cervical disc disorders, unspecified cervical region: Secondary | ICD-10-CM | POA: Diagnosis not present

## 2020-05-13 ENCOUNTER — Other Ambulatory Visit (HOSPITAL_COMMUNITY): Payer: Self-pay | Admitting: Internal Medicine

## 2020-05-13 DIAGNOSIS — R079 Chest pain, unspecified: Secondary | ICD-10-CM

## 2020-05-13 DIAGNOSIS — M542 Cervicalgia: Secondary | ICD-10-CM | POA: Diagnosis not present

## 2020-05-13 DIAGNOSIS — M5080 Other cervical disc disorders, unspecified cervical region: Secondary | ICD-10-CM | POA: Diagnosis not present

## 2020-05-18 ENCOUNTER — Ambulatory Visit: Payer: Medicare Other | Admitting: Family Medicine

## 2020-05-25 DIAGNOSIS — M5080 Other cervical disc disorders, unspecified cervical region: Secondary | ICD-10-CM | POA: Diagnosis not present

## 2020-05-25 DIAGNOSIS — M542 Cervicalgia: Secondary | ICD-10-CM | POA: Diagnosis not present

## 2020-05-26 ENCOUNTER — Ambulatory Visit (HOSPITAL_COMMUNITY)
Admission: RE | Admit: 2020-05-26 | Discharge: 2020-05-26 | Disposition: A | Discharge status: Home / Self Care | Payer: Medicare Other | Source: Ambulatory Visit | Attending: Internal Medicine | Admitting: Internal Medicine

## 2020-05-26 ENCOUNTER — Other Ambulatory Visit: Payer: Self-pay

## 2020-05-26 ENCOUNTER — Other Ambulatory Visit (HOSPITAL_COMMUNITY): Payer: Self-pay | Admitting: Internal Medicine

## 2020-05-26 DIAGNOSIS — H532 Diplopia: Secondary | ICD-10-CM | POA: Diagnosis not present

## 2020-05-27 DIAGNOSIS — M5080 Other cervical disc disorders, unspecified cervical region: Secondary | ICD-10-CM | POA: Diagnosis not present

## 2020-05-27 DIAGNOSIS — M542 Cervicalgia: Secondary | ICD-10-CM | POA: Diagnosis not present

## 2020-06-01 DIAGNOSIS — M542 Cervicalgia: Secondary | ICD-10-CM | POA: Diagnosis not present

## 2020-06-01 DIAGNOSIS — M5080 Other cervical disc disorders, unspecified cervical region: Secondary | ICD-10-CM | POA: Diagnosis not present

## 2020-06-01 NOTE — Progress Notes (Signed)
[place] Phone: [phone] Subjective:   Fontaine No, am serving as a scribe for Dr. Hulan Saas. This visit occurred during the SARS-CoV-2 public health emergency.  Safety protocols were in place, including screening questions prior to the visit, additional usage of staff PPE, and extensive cleaning of exam room while observing appropriate contact time as indicated for disinfecting solutions.   I'm seeing this patient by the request  of:  Michael Boston, MD  CC: Neck pain follow-up  CBU:[id number]   1/10/20223 Patient has made some improvement.  Patient has taken the muscle relaxer occasionally.  We discussed using it more on an as-needed basis.  Patient states it did help but feels like it could have caused her some sadness.  This could have been during the holidays and thinking of her husband.  We discussed the potential for Cymbalta.  Patient did not want to make any changes at this time.  Patient will go start with formal physical therapy which I think can be significantly beneficial working on other modalities such as iontophoresis or dry needling.  Follow-up with me again in 6 to 8 weeks  Update 06/02/2020 DONTA MCINROY is a 70 y.o. female coming in with complaint of neck pain. Patient states that she has good and bad days. Does feel like physical therapy is helping. Does a lot of studying and is trying to work on Personal assistant. Patient was using Zanaflex at night but is out of medication. Would like to discuss if she can continue to take this medication.  Patient states overall she does think she continues to improve.  Still has pain in the mornings.  If as long as she takes to 500 mg Tylenol in the morning she seems to do relatively well throughout the day.  Patient recently did have an MRI of the brain and was found to have a cyst on the thalamus.  Patient is going to be following up with neurology.  Patient  is also going to undergo a stress test.       Past Medical History:  Diagnosis Date  . Allergy   . Arthritis    back, hands  . Cancer ([place])    skin on face  . Complication of anesthesia    prolonged sedation, slow to wake up  . Endometriosis   . GERD (gastroesophageal reflux disease)   . Headache    Migraines  . Hypertension   . Hypothyroidism   . Thyroid disease   . Vertigo    Past Surgical History:  Procedure Laterality Date  . ANTERIOR AND POSTERIOR REPAIR WITH SACROSPINOUS FIXATION N/A 07/08/2015   Procedure: ANTERIOR AND POSTERIOR REPAIR ;  Surgeon: Louretta Shorten, MD;  Location: [facility];  Service: Gynecology;  Laterality: N/A;  . COLONOSCOPY    . DIAGNOSTIC LAPAROSCOPY    . LAPAROSCOPIC VAGINAL HYSTERECTOMY WITH SALPINGO OOPHORECTOMY Bilateral 07/08/2015   Procedure: LAPAROSCOPIC ASSISTED VAGINAL HYSTERECTOMY WITH BILATERAL SALPINGO OOPHORECTOMY;  Surgeon: Louretta Shorten, MD;  Location: [facility];  Service: Gynecology;  Laterality: Bilateral;  . OOPHORECTOMY  age 70   for endometrosis   Social History   Socioeconomic History  . Marital status: Widowed    Spouse name: Not on file  . Number of children: Not on file  . Years of education: Not on file  . Highest education level: Not on file  Occupational History  . Not on file  Tobacco Use  . Smoking status:  Never Smoker  . Smokeless tobacco: Never Used  Vaping Use  . Vaping Use: Never used  Substance and Sexual Activity  . Alcohol use: Not Currently  . Drug use: No  . Sexual activity: Not on file  Other Topics Concern  . Not on file  Social History Narrative  . Not on file   Social Determinants of Health   Financial Resource Strain: Not on file  Food Insecurity: Not on file  Transportation Needs: Not on file  Physical Activity: Not on file  Stress: Not on file  Social Connections: Not on file   Allergies  Allergen Reactions  . Demerol [Meperidine] Nausea Only and Other (See Comments)    Reaction=headache    Family History  Problem Relation Age of Onset  . Liver cancer Mother   . Kidney cancer Mother   . Heart disease Father   . Colon cancer Neg Hx   . Esophageal cancer Neg Hx   . Stomach cancer Neg Hx     Current Outpatient Medications (Endocrine & Metabolic):  .  levothyroxine (SYNTHROID, LEVOTHROID) 75 MCG tablet, Take 75 mcg by mouth at bedtime.    Current Outpatient Medications (Cardiovascular):  .  atenolol (TENORMIN) 50 MG tablet, Take 1 tablet by mouth Daily. Marland Kitchen  losartan (COZAAR) 100 MG tablet, Take 100 mg by mouth daily. .  metoprolol succinate (TOPROL-XL) 50 MG 24 hr tablet,      Current Outpatient Medications (Analgesics):  .  acetaminophen (TYLENOL) 500 MG tablet, Take 1,000 mg by mouth 2 (two) times daily as needed for moderate pain or headache.  .  ibuprofen (ADVIL,MOTRIN) 200 MG tablet, Take 400 mg by mouth daily as needed for mild pain.   Current Outpatient Medications (Hematological):  .  vitamin B-12 (CYANOCOBALAMIN) 1000 MCG tablet, Take 1,000 mcg by mouth daily.   Current Outpatient Medications (Other):  Marland Kitchen  Calcium Carb-Cholecalciferol 500-600 MG-UNIT TABS, Take 1 tablet by mouth daily. .  meclizine (ANTIVERT) 25 MG tablet, Take 1 tablet (25 mg total) by mouth 3 (three) times daily as needed for dizziness. Marland Kitchen  omeprazole (PRILOSEC) 20 MG capsule, Take 20 mg by mouth daily as needed (for heartburn).  .  Probiotic Product (PROBIOTIC PO), Take 1 capsule by mouth daily. Ultimate 16 strain w/ trace minerals & sos .  tiZANidine (ZANAFLEX) 2 MG tablet, Take 1 tablet (2 mg total) by mouth at bedtime. .  Vitamins A & D (VITAMIN A & D) 5000-400 UNITS CAPS, Take 1 capsule by mouth daily.  Current Facility-Administered Medications (Other):  .  0.9 %  sodium chloride infusion   Reviewed prior external information including notes and imaging from  primary care provider As well as notes that were available from care everywhere and other healthcare systems.  Past  medical history, social, surgical and family history all reviewed in electronic medical record.  No pertanent information unless stated regarding to the chief complaint.   Review of Systems:  No  visual changes, nausea, vomiting, diarrhea, constipation, dizziness, abdominal pain, skin rash, fevers, chills, night sweats, weight loss, swollen lymph nodes, joint swelling, chest pain, shortness of breath, mood changes. POSITIVE muscle aches, body aches, headache, sometimes double vision  Objective  Blood pressure 124/80, pulse 64, height 5\' 7"  (1.702 m), weight 195 lb (88.5 kg), SpO2 97 %.   General: No apparent distress alert and oriented x3 mood and affect normal, dressed appropriately.  HEENT: Pupils equal, extraocular movements intact  Respiratory: Patient's speak in full sentences and does not  appear short of breath  Cardiovascular: No lower extremity edema, non tender, no erythema  Gait normal with good balance and coordination.  Slow. MSK: Arthritic changes of multiple joints. Patient's neck still has some mild loss of lordosis.  Negative Spurling's.  5 out of 5 strength of the upper extremities are noted.  Good grip strength noted.  Patient does have some loss of sidebending of the neck bilaterally.  Mild loss of rotation of the neck to the right   Impression and Recommendations:     The above documentation has been reviewed and is accurate and complete Lyndal Pulley, DO

## 2020-06-02 ENCOUNTER — Telehealth (HOSPITAL_COMMUNITY): Payer: Self-pay | Admitting: *Deleted

## 2020-06-02 ENCOUNTER — Ambulatory Visit (INDEPENDENT_AMBULATORY_CARE_PROVIDER_SITE_OTHER): Payer: Medicare Other | Admitting: Family Medicine

## 2020-06-02 ENCOUNTER — Encounter: Payer: Self-pay | Admitting: Family Medicine

## 2020-06-02 ENCOUNTER — Other Ambulatory Visit: Payer: Self-pay

## 2020-06-02 DIAGNOSIS — M5030 Other cervical disc degeneration, unspecified cervical region: Secondary | ICD-10-CM | POA: Diagnosis not present

## 2020-06-02 NOTE — Assessment & Plan Note (Addendum)
Patient still has made some improvement. Worked well with PT. discussed continuing home exercises.  We discussed the Zanaflex and refilled the 2 mg and encouraged her to try half tab when needed.  Patient is not taking it regularly.  Can help when she is having the pain though.  We discussed with patient that I do feel that it is secondary to some arthritis and muscle imbalances and will need to continue.  Once again discussed the potential for Cymbalta secondary to her chronic aches and pains but patient wants to avoid that again at this time.  Patient will follow up with me again 12 weeks.

## 2020-06-02 NOTE — Telephone Encounter (Signed)
Patient given detailed instructions per Stress Test Requisition Sheet for test on 06/09/20 at 1445.Patient Notified to arrive 30 minutes early, and that it is imperative to arrive on time for appointment to keep from having the test rescheduled.  Patient verbalized understanding. Frannie Shedrick, [place]e [place]

## 2020-06-02 NOTE — Patient Instructions (Signed)
Refilled zanaflex take 1/2 tab to one full tab 3 nights in a row Will hold on Cymbalta for now See me again in 3 months

## 2020-06-07 ENCOUNTER — Encounter: Payer: Self-pay | Admitting: *Deleted

## 2020-06-07 ENCOUNTER — Other Ambulatory Visit (HOSPITAL_COMMUNITY)
Admission: RE | Admit: 2020-06-07 | Discharge: 2020-06-07 | Disposition: A | Discharge status: Home / Self Care | Payer: Medicare Other | Source: Ambulatory Visit | Attending: Internal Medicine | Admitting: Internal Medicine

## 2020-06-07 DIAGNOSIS — Z20822 Contact with and (suspected) exposure to covid-19: Secondary | ICD-10-CM | POA: Diagnosis not present

## 2020-06-07 DIAGNOSIS — Z01812 Encounter for preprocedural laboratory examination: Secondary | ICD-10-CM | POA: Insufficient documentation

## 2020-06-07 DIAGNOSIS — M542 Cervicalgia: Secondary | ICD-10-CM | POA: Diagnosis not present

## 2020-06-07 DIAGNOSIS — M5080 Other cervical disc disorders, unspecified cervical region: Secondary | ICD-10-CM | POA: Diagnosis not present

## 2020-06-07 LAB — SARS CORONAVIRUS 2 (TAT 6-24 HRS): SARS Coronavirus 2: NEGATIVE

## 2020-06-08 ENCOUNTER — Encounter: Payer: Self-pay | Admitting: Diagnostic Neuroimaging

## 2020-06-08 ENCOUNTER — Ambulatory Visit (INDEPENDENT_AMBULATORY_CARE_PROVIDER_SITE_OTHER): Payer: Medicare Other | Admitting: Diagnostic Neuroimaging

## 2020-06-08 VITALS — BP 132/77 | HR 63 | Ht 67.0 in | Wt 197.0 lb

## 2020-06-08 DIAGNOSIS — H532 Diplopia: Secondary | ICD-10-CM

## 2020-06-08 NOTE — Progress Notes (Signed)
[facility]  PATIENT: Abigail Robinson DOB: Dec 06, 1950  REFERRING CLINICIAN: Michael Boston, MD HISTORY FROM: Patient REASON FOR VISIT: New consult   HISTORICAL  CHIEF COMPLAINT:  Chief Complaint  Patient presents with  . Thalamic cyst, diplopia    Rm 7 New Pt "when I look down for a few minutes then look up everything is double"     HISTORY OF PRESENT ILLNESS:   70 year old female here for evaluation of double vision and left thalamic cyst.  2006 patient was having headaches, tremors and nystagmus.  Also having vertigo issues.  She had MRI of the brain at that time which was unremarkable except for incidental left midbrain cyst.  January 2022 patient woke up to go the bathroom, and when she came to lay down she had sudden onset of vertigo room spinning sensation nausea lasting for 6 hours.  Symptoms spontaneously resolved.  Since that time she has had intermittent double vision especially when she is looking down reading.  Symptoms worse later in the day.  Also having some issues with neck pain.  Had MRI of the brain which again demonstrated the left brain cyst, now localized to the thalamus.  Cyst has slightly enlarged.  No surrounding gliosis or major mass-effect.  Patient continues to have intermittent blurred vision and double vision.  She feels blurred vision with each eye open.  With both eyes open she also feels double double and blurred vision.  She has not been to ophthalmology lately.  No headaches.  No numbness or tingling.   REVIEW OF SYSTEMS: Full 14 system review of systems performed and negative with exception of: As per HPI.  ALLERGIES: Allergies  Allergen Reactions  . Demerol [Meperidine] Nausea Only and Other (See Comments)    Reaction=headache  . Other     Other reaction(s): Unknown    HOME MEDICATIONS: Outpatient Medications Prior to Visit  Medication Sig Dispense Refill  . acetaminophen (TYLENOL) 500 MG tablet Take 1,000 mg by  mouth 2 (two) times daily as needed for moderate pain or headache.     Marland Kitchen atenolol (TENORMIN) 50 MG tablet Take 1 tablet by mouth Daily.    . Calcium Carb-Cholecalciferol 500-600 MG-UNIT TABS Take 1 tablet by mouth daily.    . celecoxib (CELEBREX) 200 MG capsule Take 200 mg by mouth daily.    Marland Kitchen levothyroxine (SYNTHROID, LEVOTHROID) 75 MCG tablet Take 75 mcg by mouth at bedtime.     Marland Kitchen losartan (COZAAR) 100 MG tablet Take 100 mg by mouth daily.    . meclizine (ANTIVERT) 25 MG tablet Take 1 tablet (25 mg total) by mouth 3 (three) times daily as needed for dizziness. 30 tablet 0  . omeprazole (PRILOSEC) 20 MG capsule Take 20 mg by mouth daily as needed (for heartburn).     . Probiotic Product (PROBIOTIC PO) Take 1 capsule by mouth daily. Ultimate 16 strain w/ trace minerals & sos    . rosuvastatin (CRESTOR) 5 MG tablet 1 tablet at bedtime.    . vitamin B-12 (CYANOCOBALAMIN) 1000 MCG tablet Take 1,000 mcg by mouth daily.    . Vitamins A & D (VITAMIN A & D) 5000-400 UNITS CAPS Take 1 capsule by mouth daily.    Marland Kitchen tiZANidine (ZANAFLEX) 2 MG tablet Take 1 tablet (2 mg total) by mouth at bedtime. (Patient not taking: Reported on 06/08/2020) 30 tablet 0  . ibuprofen (ADVIL,MOTRIN) 200 MG tablet Take 400 mg by mouth daily as needed for mild pain.    Marland Kitchen  metoprolol succinate (TOPROL-XL) 50 MG 24 hr tablet  (Patient not taking: Reported on 06/08/2020)     Facility-Administered Medications Prior to Visit  Medication Dose Route Frequency Provider Last Rate Last Admin  . 0.9 %  sodium chloride infusion  500 mL Intravenous Continuous Milus Banister, MD        PAST MEDICAL HISTORY: Past Medical History:  Diagnosis Date  . Allergy   . Arthritis    back, hands  . Cancer ([place])    skin on face  . Complication of anesthesia    prolonged sedation, slow to wake up  . Diplopia   . Endometriosis   . GERD (gastroesophageal reflux disease)   . GERD (gastroesophageal reflux disease)   . Headache    Migraines  .  Hyperlipidemia   . Hypertension   . Hypothyroidism   . Skin cancer    hx of  . Thalamic cyst   . Thyroid disease   . Vertigo     PAST SURGICAL HISTORY: Past Surgical History:  Procedure Laterality Date  . ANTERIOR AND POSTERIOR REPAIR WITH SACROSPINOUS FIXATION N/A 07/08/2015   Procedure: ANTERIOR AND POSTERIOR REPAIR ;  Surgeon: Louretta Shorten, MD;  Location: [facility];  Service: Gynecology;  Laterality: N/A;  . COLONOSCOPY    . DIAGNOSTIC LAPAROSCOPY    . LAPAROSCOPIC VAGINAL HYSTERECTOMY WITH SALPINGO OOPHORECTOMY Bilateral 07/08/2015   Procedure: LAPAROSCOPIC ASSISTED VAGINAL HYSTERECTOMY WITH BILATERAL SALPINGO OOPHORECTOMY;  Surgeon: Louretta Shorten, MD;  Location: [facility];  Service: Gynecology;  Laterality: Bilateral;  . OOPHORECTOMY  age 56   for endometrosis    FAMILY HISTORY: Family History  Problem Relation Age of Onset  . Liver cancer Mother   . Kidney cancer Mother   . Lung cancer Mother   . Heart disease Father   . Hypertension Father   . Diabetes Brother   . Breast cancer Maternal Grandmother   . Stroke Maternal Grandmother   . Heart attack Brother   . Colon cancer Neg Hx   . Esophageal cancer Neg Hx   . Stomach cancer Neg Hx     SOCIAL HISTORY: Social History   Socioeconomic History  . Marital status: Widowed    Spouse name: Not on file  . Number of children: 0  . Years of education: Not on file  . Highest education level: Bachelor's degree (e.g., BA, AB, BS)  Occupational History    Comment: retired  Tobacco Use  . Smoking status: Never Smoker  . Smokeless tobacco: Never Used  Vaping Use  . Vaping Use: Never used  Substance and Sexual Activity  . Alcohol use: Not Currently  . Drug use: No  . Sexual activity: Not on file  Other Topics Concern  . Not on file  Social History Narrative   Lives alone   Social Determinants of Health   Financial Resource Strain: Not on file  Food Insecurity: Not on file  Transportation Needs: Not on file  Physical  Activity: Not on file  Stress: Not on file  Social Connections: Not on file  Intimate Partner Violence: Not on file     PHYSICAL EXAM  GENERAL EXAM/CONSTITUTIONAL: Vitals:  Vitals:   06/08/20 1345  BP: 132/77  Pulse: 63  Weight: 197 lb (89.4 kg)  Height: 5\' 7"  (1.702 m)   Body mass index is 30.85 kg/m. Wt Readings from Last 3 Encounters:  06/08/20 197 lb (89.4 kg)  06/02/20 195 lb (88.5 kg)  03/29/20 195 lb (88.5 kg)  Patient is in no distress; well developed, nourished and groomed; neck is supple  CARDIOVASCULAR:  Examination of carotid arteries is normal; no carotid bruits  Regular rate and rhythm, no murmurs  Examination of peripheral vascular system by observation and palpation is normal  EYES:  Ophthalmoscopic exam of optic discs and posterior segments is normal; no papilledema or hemorrhages No exam data present  MUSCULOSKELETAL:  Gait, strength, tone, movements noted in Neurologic exam below  NEUROLOGIC: MENTAL STATUS:  No flowsheet data found.  awake, alert, oriented to person, place and time  recent and remote memory intact  normal attention and concentration  language fluent, comprehension intact, naming intact  fund of knowledge appropriate  CRANIAL NERVE:   2nd - no papilledema on fundoscopic exam  2nd, 3rd, 4th, 6th - pupils equal and reactive to light, visual fields full to confrontation, extraocular muscles intact, no nystagmus; SUBJECTIVE BLURRED VISION WITH EACH EYE INDIVIDUALLY; MILD OVERLAP DOUBLE VISION WITH BOTH EYES OPEN  5th - facial sensation symmetric  7th - facial strength symmetric  8th - hearing intact  9th - palate elevates symmetrically, uvula midline  11th - shoulder shrug symmetric  12th - tongue protrusion midline  MOTOR:   normal bulk and tone, full strength in the BUE, BLE  SENSORY:   normal and symmetric to light touch, temperature, vibration  COORDINATION:   finger-nose-finger, fine finger  movements normal  REFLEXES:   deep tendon reflexes present and symmetric  GAIT/STATION:   narrow based gait     DIAGNOSTIC DATA (LABS, IMAGING, TESTING) - I reviewed patient records, labs, notes, testing and imaging myself where available.  Lab Results  Component Value Date   WBC 10.3 01/12/2020   HGB 14.4 01/12/2020   HCT 43.1 01/12/2020   MCV 91.1 01/12/2020   PLT 300 01/12/2020      Component Value Date/Time   NA 139 01/12/2020 1543   K 3.4 (L) 01/12/2020 1543   CL 104 01/12/2020 1543   CO2 25 01/12/2020 1543   GLUCOSE 92 01/12/2020 1543   BUN 13 01/12/2020 1543   CREATININE 0.91 01/12/2020 1543   CALCIUM 9.9 01/12/2020 1543   GFRNONAA >60 01/12/2020 1543   GFRAA >60 07/01/2015 1040   No results found for: CHOL, HDL, LDLCALC, LDLDIRECT, TRIG, CHOLHDL No results found for: HGBA1C No results found for: VITAMINB12 No results found for: TSH  05/01/2004 MRI brain with and without [I reviewed images myself and agree with interpretation. -VRP]  1. Cerebellar vermian atrophy without evidence of a focal stroke.   2. Presumably incidental 3 mm cyst within the cerebral peduncle on the left. I do not think this is a clinically relevant finding.   3. No evidence of stroke or other pathologic entity.   05/10/2020 MRI brain with and without contrast [I reviewed images myself and agree with interpretation. -VRP]  - No acute abnormality - 4 x 8 mm simple cyst left inferior thalamus with interval enlargement since 2006. Possible dilated perivascular space.    ASSESSMENT AND PLAN  70 y.o. year old female here with intermittent double vision, vertigo, blurred vision, starting in January 2022.  Also had some symptoms in 2006.  Likely incidental left thalamic cyst noted, but slightly increasing in size over 16 years.  No signs of mass-effect at this time.  Will check lab testing to rule out myasthenia gravis.  Dx:  1. Double vision      PLAN:  DOUBLE VISION  - check  AchR ab  BLURRED VISION (  monocular and binocular) - follow up with ophthalmology  LEFT THALAMIC CYST  - monitor symptoms; consider refer to [place] - Likely benign positional vertigo; stable over many years; monitor  Orders Placed This Encounter  Procedures  . AChR Abs with Reflex to [facility]   Return pending testing, for pending if symptoms worsen or fail to improve.    Penni Bombard, MD 2/57/4935, 5:21 PM Certified in Neurology, Neurophysiology and Neuroimaging  [facility] [address] [place], [place] [address] [phone]

## 2020-06-08 NOTE — Patient Instructions (Signed)
  DOUBLE VISION  - check AchR ab  BLURRED VISION (monocular and binocular) - follow up with ophthalmology  LEFT THALAMIC CYST  - monitor symptoms; consider refer to [place]

## 2020-06-09 ENCOUNTER — Ambulatory Visit (HOSPITAL_BASED_OUTPATIENT_CLINIC_OR_DEPARTMENT_OTHER): Payer: Medicare Other

## 2020-06-09 ENCOUNTER — Other Ambulatory Visit: Payer: Self-pay

## 2020-06-09 ENCOUNTER — Ambulatory Visit (HOSPITAL_COMMUNITY): Payer: Medicare Other | Attending: Cardiovascular Disease

## 2020-06-09 DIAGNOSIS — R079 Chest pain, unspecified: Secondary | ICD-10-CM

## 2020-06-10 DIAGNOSIS — M5080 Other cervical disc disorders, unspecified cervical region: Secondary | ICD-10-CM | POA: Diagnosis not present

## 2020-06-10 DIAGNOSIS — M542 Cervicalgia: Secondary | ICD-10-CM | POA: Diagnosis not present

## 2020-06-14 DIAGNOSIS — M5080 Other cervical disc disorders, unspecified cervical region: Secondary | ICD-10-CM | POA: Diagnosis not present

## 2020-06-14 DIAGNOSIS — M542 Cervicalgia: Secondary | ICD-10-CM | POA: Diagnosis not present

## 2020-07-02 DIAGNOSIS — H2513 Age-related nuclear cataract, bilateral: Secondary | ICD-10-CM | POA: Diagnosis not present

## 2020-07-02 DIAGNOSIS — H532 Diplopia: Secondary | ICD-10-CM | POA: Diagnosis not present

## 2020-08-10 DIAGNOSIS — H2512 Age-related nuclear cataract, left eye: Secondary | ICD-10-CM | POA: Diagnosis not present

## 2020-08-10 DIAGNOSIS — H25013 Cortical age-related cataract, bilateral: Secondary | ICD-10-CM | POA: Diagnosis not present

## 2020-08-10 DIAGNOSIS — H25043 Posterior subcapsular polar age-related cataract, bilateral: Secondary | ICD-10-CM | POA: Diagnosis not present

## 2020-08-10 DIAGNOSIS — H18413 Arcus senilis, bilateral: Secondary | ICD-10-CM | POA: Diagnosis not present

## 2020-08-10 DIAGNOSIS — H2513 Age-related nuclear cataract, bilateral: Secondary | ICD-10-CM | POA: Diagnosis not present

## 2020-08-30 NOTE — Progress Notes (Signed)
[facility] [address] [place] Phone: [phone] Subjective:   I Kandace Blitz am serving as a Education administrator for Dr. Hulan Saas.  This visit occurred during the SARS-CoV-2 public health emergency.  Safety protocols were in place, including screening questions prior to the visit, additional usage of staff PPE, and extensive cleaning of exam room while observing appropriate contact time as indicated for disinfecting solutions.    I'm seeing this patient by the request  of:  Michael Boston, MD  CC: Neck pain and back pain  [id number]  06/02/2020 Patient still has made some improvement. Worked well with PT. discussed continuing home exercises.  We discussed the Zanaflex and refilled the 2 mg and encouraged her to try half tab when needed.  Patient is not taking it regularly.  Can help when she is having the pain though.  We discussed with patient that I do feel that it is secondary to some arthritis and muscle imbalances and will need to continue.  Once again discussed the potential for Cymbalta secondary to her chronic aches and pains but patient wants to avoid that again at this time.  Patient will follow up with me again 12 weeks.  Update 08/31/2020 KAMRYN MESSINEO is a 70 y.o. female coming in with complaint of neck pain. Patient states she is still tight and the pain comes and goes. Lower back is painful as well.  Patient denies any radiation down the legs.  Does have a past medical history of significant for cancer but otherwise of the skin.  Patient states that it is a dull, throbbing aching pain.  Patient states when it comes to her neck still when she changes position she continues to have some mild blurry vision or double vision.  Patient is going to be having cataract surgery soon.  Doing the exercises intermittently.  Feels that the vitamin supplementations have been helpful.      Past Medical History:  Diagnosis Date   Allergy     Arthritis    back, hands   Cancer ([place])    skin on face   Complication of anesthesia    prolonged sedation, slow to wake up   Diplopia    Endometriosis    GERD (gastroesophageal reflux disease)    GERD (gastroesophageal reflux disease)    Headache    Migraines   Hyperlipidemia    Hypertension    Hypothyroidism    Skin cancer    hx of   Thalamic cyst    Thyroid disease    Vertigo    Past Surgical History:  Procedure Laterality Date   ANTERIOR AND POSTERIOR REPAIR WITH SACROSPINOUS FIXATION N/A 07/08/2015   Procedure: ANTERIOR AND POSTERIOR REPAIR ;  Surgeon: Louretta Shorten, MD;  Location: [facility];  Service: Gynecology;  Laterality: N/A;   COLONOSCOPY     DIAGNOSTIC LAPAROSCOPY     LAPAROSCOPIC VAGINAL HYSTERECTOMY WITH SALPINGO OOPHORECTOMY Bilateral 07/08/2015   Procedure: LAPAROSCOPIC ASSISTED VAGINAL HYSTERECTOMY WITH BILATERAL SALPINGO OOPHORECTOMY;  Surgeon: Louretta Shorten, MD;  Location: [facility];  Service: Gynecology;  Laterality: Bilateral;   OOPHORECTOMY  age 57   for endometrosis   Social History   Socioeconomic History   Marital status: Widowed    Spouse name: Not on file   Number of children: 0   Years of education: Not on file   Highest education level: Bachelor's degree (e.g., BA, AB, BS)  Occupational History    Comment: retired  Tobacco Use  Smoking status: Never   Smokeless tobacco: Never  Vaping Use   Vaping Use: Never used  Substance and Sexual Activity   Alcohol use: Not Currently   Drug use: No   Sexual activity: Not on file  Other Topics Concern   Not on file  Social History Narrative   Lives alone   Social Determinants of Health   Financial Resource Strain: Not on file  Food Insecurity: Not on file  Transportation Needs: Not on file  Physical Activity: Not on file  Stress: Not on file  Social Connections: Not on file   Allergies  Allergen Reactions   Demerol [Meperidine] Nausea Only and Other (See Comments)    Reaction=headache   Other      Other reaction(s): Unknown   Family History  Problem Relation Age of Onset   Liver cancer Mother    Kidney cancer Mother    Lung cancer Mother    Heart disease Father    Hypertension Father    Diabetes Brother    Breast cancer Maternal Grandmother    Stroke Maternal Grandmother    Heart attack Brother    Colon cancer Neg Hx    Esophageal cancer Neg Hx    Stomach cancer Neg Hx     Current Outpatient Medications (Endocrine & Metabolic):    levothyroxine (SYNTHROID, LEVOTHROID) 75 MCG tablet, Take 75 mcg by mouth at bedtime.    Current Outpatient Medications (Cardiovascular):    atenolol (TENORMIN) 50 MG tablet, Take 1 tablet by mouth Daily.   losartan (COZAAR) 100 MG tablet, Take 100 mg by mouth daily.   rosuvastatin (CRESTOR) 5 MG tablet, 1 tablet at bedtime.     Current Outpatient Medications (Analgesics):    acetaminophen (TYLENOL) 500 MG tablet, Take 1,000 mg by mouth 2 (two) times daily as needed for moderate pain or headache.    celecoxib (CELEBREX) 200 MG capsule, Take 200 mg by mouth daily.   Current Outpatient Medications (Hematological):    vitamin B-12 (CYANOCOBALAMIN) 1000 MCG tablet, Take 1,000 mcg by mouth daily.   Current Outpatient Medications (Other):    Calcium Carb-Cholecalciferol 500-600 MG-UNIT TABS, Take 1 tablet by mouth daily.   meclizine (ANTIVERT) 25 MG tablet, Take 1 tablet (25 mg total) by mouth 3 (three) times daily as needed for dizziness.   omeprazole (PRILOSEC) 20 MG capsule, Take 20 mg by mouth daily as needed (for heartburn).    Probiotic Product (PROBIOTIC PO), Take 1 capsule by mouth daily. Ultimate 16 strain w/ trace minerals & sos   tiZANidine (ZANAFLEX) 2 MG tablet, Take 1 tablet (2 mg total) by mouth at bedtime.   Vitamins A & D (VITAMIN A & D) 5000-400 UNITS CAPS, Take 1 capsule by mouth daily.  Current Facility-Administered Medications (Other):    0.9 %  sodium chloride infusion   Reviewed prior external information  including notes and imaging from  primary care provider As well as notes that were available from care everywhere and other healthcare systems.  Past medical history, social, surgical and family history all reviewed in electronic medical record.  No pertanent information unless stated regarding to the chief complaint.   Review of Systems:  No headache, visual changes, nausea, vomiting, diarrhea, constipation, dizziness, abdominal pain, skin rash, fevers, chills, night sweats, weight loss, swollen lymph nodes  joint swelling, chest pain, shortness of breath, mood changes. POSITIVE muscle aches, body aches  Objective  Blood pressure 140/90, pulse (!) 53, height 5\' 7"  (1.702 m), weight 190 lb (86.2  kg), SpO2 95 %.   General: No apparent distress alert and oriented x3 mood and affect normal, dressed appropriately.  HEENT: Pupils equal, extraocular movements intact  Respiratory: Patient's speak in full sentences and does not appear short of breath  Cardiovascular: No lower extremity edema, non tender, no erythema  Gait mild antalgic noted. Neck exam does have loss of lordosis.  Limited sidebending bilaterally.  Crepitus noted. Low back exam does have loss of lordosis as well.  Negative straight leg test but does have tightness with straight leg test bilaterally.  Some limited range of motion in all planes   Impression and Recommendations:     The above documentation has been reviewed and is accurate and complete Lyndal Pulley, DO

## 2020-08-31 ENCOUNTER — Ambulatory Visit (INDEPENDENT_AMBULATORY_CARE_PROVIDER_SITE_OTHER): Payer: Medicare Other | Admitting: Family Medicine

## 2020-08-31 ENCOUNTER — Other Ambulatory Visit: Payer: Self-pay

## 2020-08-31 ENCOUNTER — Encounter: Payer: Self-pay | Admitting: Family Medicine

## 2020-08-31 ENCOUNTER — Ambulatory Visit (INDEPENDENT_AMBULATORY_CARE_PROVIDER_SITE_OTHER): Payer: Medicare Other

## 2020-08-31 VITALS — BP 140/90 | HR 53 | Ht 67.0 in | Wt 190.0 lb

## 2020-08-31 DIAGNOSIS — M545 Low back pain, unspecified: Secondary | ICD-10-CM | POA: Diagnosis not present

## 2020-08-31 DIAGNOSIS — M5030 Other cervical disc degeneration, unspecified cervical region: Secondary | ICD-10-CM | POA: Diagnosis not present

## 2020-08-31 DIAGNOSIS — M5136 Other intervertebral disc degeneration, lumbar region: Secondary | ICD-10-CM | POA: Insufficient documentation

## 2020-08-31 DIAGNOSIS — M51369 Other intervertebral disc degeneration, lumbar region without mention of lumbar back pain or lower extremity pain: Secondary | ICD-10-CM | POA: Insufficient documentation

## 2020-08-31 DIAGNOSIS — M47816 Spondylosis without myelopathy or radiculopathy, lumbar region: Secondary | ICD-10-CM | POA: Diagnosis not present

## 2020-08-31 DIAGNOSIS — G8929 Other chronic pain: Secondary | ICD-10-CM | POA: Diagnosis not present

## 2020-08-31 MED ORDER — TIZANIDINE HCL 2 MG PO TABS
2.0000 mg | ORAL_TABLET | Freq: Every day | ORAL | 0 refills | Status: DC
Start: 2020-08-31 — End: 2022-04-06

## 2020-08-31 NOTE — Patient Instructions (Addendum)
Good to see you Back xray Half tab of zanaflex Enjoy the house guest but take time for yourself PT will call you See me again in 2 months

## 2020-08-31 NOTE — Assessment & Plan Note (Signed)
Arthritic changes of the lumbar spine.  Patient does have tightness noted.  We will start with formal physical therapy.  Patient does have Celebrex and has responded to Zanaflex for the neck pain previously.  We will see if patient responds to this as well.  Increase activity slowly.  Follow-up with me again 2 months

## 2020-08-31 NOTE — Assessment & Plan Note (Signed)
DDD noted, discussed continuing home exercises.  Patient continues to have significant tightness.  Patient has responded well to the Zanaflex and we discussed continuing a half tab at night increase activity slowly.  Follow-up with me again 2 months.  We will start with formal physical therapy which I think will be helpful to.

## 2020-09-01 ENCOUNTER — Encounter: Payer: Self-pay | Admitting: Family Medicine

## 2020-11-08 ENCOUNTER — Ambulatory Visit: Payer: Medicare Other | Admitting: Family Medicine

## 2020-11-08 DIAGNOSIS — H52222 Regular astigmatism, left eye: Secondary | ICD-10-CM | POA: Diagnosis not present

## 2020-11-08 DIAGNOSIS — H2512 Age-related nuclear cataract, left eye: Secondary | ICD-10-CM | POA: Diagnosis not present

## 2020-11-09 DIAGNOSIS — H2511 Age-related nuclear cataract, right eye: Secondary | ICD-10-CM | POA: Diagnosis not present

## 2020-11-29 DIAGNOSIS — H2511 Age-related nuclear cataract, right eye: Secondary | ICD-10-CM | POA: Diagnosis not present

## 2021-01-27 DIAGNOSIS — L821 Other seborrheic keratosis: Secondary | ICD-10-CM | POA: Diagnosis not present

## 2021-01-27 DIAGNOSIS — I8391 Asymptomatic varicose veins of right lower extremity: Secondary | ICD-10-CM | POA: Diagnosis not present

## 2021-01-27 DIAGNOSIS — I8392 Asymptomatic varicose veins of left lower extremity: Secondary | ICD-10-CM | POA: Diagnosis not present

## 2021-01-27 DIAGNOSIS — L570 Actinic keratosis: Secondary | ICD-10-CM | POA: Diagnosis not present

## 2021-01-27 DIAGNOSIS — L814 Other melanin hyperpigmentation: Secondary | ICD-10-CM | POA: Diagnosis not present

## 2021-01-27 DIAGNOSIS — D1801 Hemangioma of skin and subcutaneous tissue: Secondary | ICD-10-CM | POA: Diagnosis not present

## 2021-01-27 DIAGNOSIS — L853 Xerosis cutis: Secondary | ICD-10-CM | POA: Diagnosis not present

## 2021-01-27 DIAGNOSIS — Z85828 Personal history of other malignant neoplasm of skin: Secondary | ICD-10-CM | POA: Diagnosis not present

## 2021-01-27 DIAGNOSIS — D692 Other nonthrombocytopenic purpura: Secondary | ICD-10-CM | POA: Diagnosis not present

## 2021-01-27 DIAGNOSIS — D225 Melanocytic nevi of trunk: Secondary | ICD-10-CM | POA: Diagnosis not present

## 2021-01-27 DIAGNOSIS — D2261 Melanocytic nevi of right upper limb, including shoulder: Secondary | ICD-10-CM | POA: Diagnosis not present

## 2021-02-08 DIAGNOSIS — I10 Essential (primary) hypertension: Secondary | ICD-10-CM | POA: Diagnosis not present

## 2021-02-08 DIAGNOSIS — E039 Hypothyroidism, unspecified: Secondary | ICD-10-CM | POA: Diagnosis not present

## 2021-02-08 DIAGNOSIS — E785 Hyperlipidemia, unspecified: Secondary | ICD-10-CM | POA: Diagnosis not present

## 2021-02-14 DIAGNOSIS — Z961 Presence of intraocular lens: Secondary | ICD-10-CM | POA: Diagnosis not present

## 2021-02-14 DIAGNOSIS — H04123 Dry eye syndrome of bilateral lacrimal glands: Secondary | ICD-10-CM | POA: Diagnosis not present

## 2021-02-14 DIAGNOSIS — H5315 Visual distortions of shape and size: Secondary | ICD-10-CM | POA: Diagnosis not present

## 2021-02-17 DIAGNOSIS — E785 Hyperlipidemia, unspecified: Secondary | ICD-10-CM | POA: Diagnosis not present

## 2021-02-17 DIAGNOSIS — Z0000 Encounter for general adult medical examination without abnormal findings: Secondary | ICD-10-CM | POA: Diagnosis not present

## 2021-02-17 DIAGNOSIS — M19049 Primary osteoarthritis, unspecified hand: Secondary | ICD-10-CM | POA: Diagnosis not present

## 2021-02-17 DIAGNOSIS — Z1339 Encounter for screening examination for other mental health and behavioral disorders: Secondary | ICD-10-CM | POA: Diagnosis not present

## 2021-02-17 DIAGNOSIS — I10 Essential (primary) hypertension: Secondary | ICD-10-CM | POA: Diagnosis not present

## 2021-02-17 DIAGNOSIS — E039 Hypothyroidism, unspecified: Secondary | ICD-10-CM | POA: Diagnosis not present

## 2021-02-17 DIAGNOSIS — E669 Obesity, unspecified: Secondary | ICD-10-CM | POA: Diagnosis not present

## 2021-02-17 DIAGNOSIS — Z1331 Encounter for screening for depression: Secondary | ICD-10-CM | POA: Diagnosis not present

## 2021-02-17 DIAGNOSIS — N182 Chronic kidney disease, stage 2 (mild): Secondary | ICD-10-CM | POA: Diagnosis not present

## 2021-02-17 DIAGNOSIS — Z23 Encounter for immunization: Secondary | ICD-10-CM | POA: Diagnosis not present

## 2021-02-17 DIAGNOSIS — M85859 Other specified disorders of bone density and structure, unspecified thigh: Secondary | ICD-10-CM | POA: Diagnosis not present

## 2021-04-06 DIAGNOSIS — H9202 Otalgia, left ear: Secondary | ICD-10-CM | POA: Diagnosis not present

## 2021-04-06 DIAGNOSIS — M26623 Arthralgia of bilateral temporomandibular joint: Secondary | ICD-10-CM | POA: Diagnosis not present

## 2021-06-03 DIAGNOSIS — H903 Sensorineural hearing loss, bilateral: Secondary | ICD-10-CM | POA: Diagnosis not present

## 2021-06-08 DIAGNOSIS — K625 Hemorrhage of anus and rectum: Secondary | ICD-10-CM | POA: Diagnosis not present

## 2021-06-08 DIAGNOSIS — G9389 Other specified disorders of brain: Secondary | ICD-10-CM | POA: Diagnosis not present

## 2021-06-08 DIAGNOSIS — I10 Essential (primary) hypertension: Secondary | ICD-10-CM | POA: Diagnosis not present

## 2021-06-08 DIAGNOSIS — E785 Hyperlipidemia, unspecified: Secondary | ICD-10-CM | POA: Diagnosis not present

## 2021-06-08 DIAGNOSIS — K649 Unspecified hemorrhoids: Secondary | ICD-10-CM | POA: Diagnosis not present

## 2021-07-06 ENCOUNTER — Encounter: Payer: Self-pay | Admitting: Gastroenterology

## 2021-07-25 DIAGNOSIS — H532 Diplopia: Secondary | ICD-10-CM | POA: Diagnosis not present

## 2021-07-25 DIAGNOSIS — Z961 Presence of intraocular lens: Secondary | ICD-10-CM | POA: Diagnosis not present

## 2021-07-25 DIAGNOSIS — H47033 Optic nerve hypoplasia, bilateral: Secondary | ICD-10-CM | POA: Diagnosis not present

## 2021-07-25 DIAGNOSIS — H26493 Other secondary cataract, bilateral: Secondary | ICD-10-CM | POA: Diagnosis not present

## 2021-07-25 DIAGNOSIS — H04123 Dry eye syndrome of bilateral lacrimal glands: Secondary | ICD-10-CM | POA: Diagnosis not present

## 2021-07-26 ENCOUNTER — Ambulatory Visit (INDEPENDENT_AMBULATORY_CARE_PROVIDER_SITE_OTHER): Payer: Medicare Other | Admitting: Gastroenterology

## 2021-07-26 ENCOUNTER — Encounter: Payer: Self-pay | Admitting: Gastroenterology

## 2021-07-26 DIAGNOSIS — Z1211 Encounter for screening for malignant neoplasm of colon: Secondary | ICD-10-CM

## 2021-07-26 DIAGNOSIS — K649 Unspecified hemorrhoids: Secondary | ICD-10-CM | POA: Diagnosis not present

## 2021-07-26 NOTE — Patient Instructions (Signed)
If you are age 72 or older, your body mass index should be between 23-30. Your Body mass index is 29.46 kg/m?Marland Kitchen If this is out of the aforementioned range listed, please consider follow up with your Primary Care Provider. ?________________________________________________________ ? ?The [facility] providers would like to encourage you to use [facility] to communicate with providers for non-urgent requests or questions.  Due to long hold times on the telephone, sending your provider a message by [facility] may be a faster and more efficient way to get a response.  Please allow 48 business hours for a response.  Please remember that this is for non-urgent requests.  ?_______________________________________________________ ? ?You have been scheduled for a colonoscopy. Please follow written instructions given to you at your visit today.  ?Please pick up your prep supplies at the pharmacy within the next 1-3 days. ?If you use inhalers (even only as needed), please bring them with you on the day of your procedure. ? ?Due to recent changes in healthcare laws, you may see the results of your imaging and laboratory studies on MyChart before your provider has had a chance to review them.  We understand that in some cases there may be results that are confusing or concerning to you. Not all laboratory results come back in the same time frame and the provider may be waiting for multiple results in order to interpret others.  Please give [place] 48 hours in order for your provider to thoroughly review all the results before contacting the office for clarification of your results.  ? ?Thank you for entrusting me with your care and choosing [facility]. ? ?Dr Ardis Hughs ? ?

## 2021-07-26 NOTE — Progress Notes (Signed)
Review of pertinent gastrointestinal problems: ?1. Routine risk for colon cancer: Colonoscopy Dr. Lajoyce Corners was normal 2003. Colonoscopy Dr. Ardis Hughs 2013 found single polyp that was not precancerous. She was recommended to have repeat colonoscopy in 10 years. ?2.  EGD 2017 Dr. Ardis Hughs for dysphagia found a Schatzki's ring, dilated to 20 mm with good relief of symptoms ? ? ?HPI: ?This is a very pleasant 71 year old woman who was referred to me by Michael Boston, MD  to evaluate hemorrhoids.   ? ? ?She was bothered by some itching and some bleeding at her anus several weeks ago.  She treated this with Polysporin and another type of ointment that she has used for hemorrhoids in the past.  She is also used some baby wipes type tissue paper.  These treatments have all helped quite a bit and her backside is really back to normal. ? ?She rarely struggles with constipation.  She has intentionally lost 15 pounds in the past 6 months with dietary changes eating more salads and fruits and headaches. ? ?She thinks she has been sitting more often because she is going back to school to get her masters degree and perhaps that has led to some of the hemorrhoid difficulties she has been having. ? ?Old Data Reviewed: ?Blood work on March 2023 PCP progress note showed hemoglobin was 14.  It looks like the labs might have been drawn a few months prior to then. ? ? ? ?Review of systems: ?Pertinent positive and negative review of systems were noted in the above HPI section. All other review negative. ? ? ?Past Medical History:  ?Diagnosis Date  ? Allergy   ? Arthritis   ? back, hands  ? Basal cell carcinoma   ? face  ? Colon polyps   ? Complication of anesthesia   ? prolonged sedation, slow to wake up  ? Diplopia   ? Endometriosis   ? GERD (gastroesophageal reflux disease)   ? Headache   ? Migraines  ? Hyperlipidemia   ? Hypertension   ? Hypothyroidism   ? Skin cancer   ? hx of  ? Status post dilation of esophageal narrowing   ? Thalamic cyst   ?  Thyroid disease   ? Vertigo   ? ? ?Past Surgical History:  ?Procedure Laterality Date  ? ANTERIOR AND POSTERIOR REPAIR WITH SACROSPINOUS FIXATION N/A 07/08/2015  ? Procedure: ANTERIOR AND POSTERIOR REPAIR ;  Surgeon: Louretta Shorten, MD;  Location: [facility];  Service: Gynecology;  Laterality: N/A;  ? COLONOSCOPY    ? DIAGNOSTIC LAPAROSCOPY    ? LAPAROSCOPIC VAGINAL HYSTERECTOMY WITH SALPINGO OOPHORECTOMY Bilateral 07/08/2015  ? Procedure: LAPAROSCOPIC ASSISTED VAGINAL HYSTERECTOMY WITH BILATERAL SALPINGO OOPHORECTOMY;  Surgeon: Louretta Shorten, MD;  Location: [facility];  Service: Gynecology;  Laterality: Bilateral;  ? OOPHORECTOMY  age 13  ? for endometrosis  ? ? ?Current Outpatient Medications  ?Medication Instructions  ? acetaminophen (TYLENOL) 1,000 mg, Oral, 2 times daily PRN  ? atenolol (TENORMIN) 50 MG tablet 1 tablet, Daily  ? Calcium Carb-Cholecalciferol 500-600 MG-UNIT TABS 1 tablet, Oral, Daily  ? celecoxib (CELEBREX) 200 mg, Oral, Daily  ? hydrochlorothiazide (MICROZIDE) 12.5 mg, Oral, Daily  ? levothyroxine (SYNTHROID) 75 mcg, Oral, Daily at bedtime  ? losartan (COZAAR) 100 mg, Oral, Daily  ? meclizine (ANTIVERT) 25 mg, Oral, 3 times daily PRN  ? omeprazole (PRILOSEC) 20 mg, Oral, Daily PRN  ? Probiotic Product (PROBIOTIC PO) 1 capsule, Oral, Daily, Ultimate 16 strain w/ trace minerals & sos   ?  tiZANidine (ZANAFLEX) 2 mg, Oral, Daily at bedtime  ? vitamin B-12 (CYANOCOBALAMIN) 1,000 mcg, Oral, Daily  ? Vitamins A & D (VITAMIN A & D) 5000-400 UNITS CAPS 1 capsule, Oral, Daily  ? ? ?Allergies as of 07/26/2021 - Review Complete 07/26/2021  ?Allergen Reaction Noted  ? Demerol [meperidine] Nausea Only and Other (See Comments) 01/22/2012  ? ? ?Family History  ?Problem Relation Age of Onset  ? Liver cancer Mother   ? Kidney cancer Mother   ? Lung cancer Mother   ? Heart disease Father   ? Hypertension Father   ? Cirrhosis Father   ? Macular degeneration Father   ? Diabetes Brother   ? Heart attack Brother   ? Breast cancer  Maternal Grandmother   ? Stroke Maternal Grandmother   ? Colon cancer Neg Hx   ? Esophageal cancer Neg Hx   ? Stomach cancer Neg Hx   ? ? ?Social History  ? ?Socioeconomic History  ? Marital status: Widowed  ?  Spouse name: Not on file  ? Number of children: 0  ? Years of education: Not on file  ? Highest education level: Bachelor's degree (e.g., BA, AB, BS)  ?Occupational History  ?  Comment: retired  ?Tobacco Use  ? Smoking status: Never  ? Smokeless tobacco: Never  ?Vaping Use  ? Vaping Use: Never used  ?Substance and Sexual Activity  ? Alcohol use: Not Currently  ? Drug use: No  ? Sexual activity: Not on file  ?Other Topics Concern  ? Not on file  ?Social History Narrative  ? Lives alone  ? ?Social Determinants of Health  ? ?Financial Resource Strain: Not on file  ?Food Insecurity: Not on file  ?Transportation Needs: Not on file  ?Physical Activity: Not on file  ?Stress: Not on file  ?Social Connections: Not on file  ?Intimate Partner Violence: Not on file  ? ? ? ?Physical Exam: ?Pulse 60   Ht 5' 5.75" (1.67 m) Comment: height measured without shoes  Wt 181 lb 2 oz (82.2 kg)   BMI 29.46 kg/m?  ?Constitutional: generally well-appearing ?Psychiatric: alert and oriented x3 ?Eyes: extraocular movements intact ?Mouth: oral pharynx moist, no lesions ?Neck: supple no lymphadenopathy ?Cardiovascular: heart regular rate and rhythm ?Lungs: clear to auscultation bilaterally ?Abdomen: soft, nontender, nondistended, no obvious ascites, no peritoneal signs, normal bowel sounds ?Extremities: no lower extremity edema bilaterally ?Skin: no lesions on visible extremities ?Rectal exam deferred for upcoming colonoscopy ? ?Assessment and plan: ?71 y.o. female with resolved external hemorrhoid issues, routine risk for colon cancer ? ?She is "due" for colon cancer screening and we will arrange for colonoscopy at her soonest convenience. ? ?She treated her hemorrhoids quite effectively with some over-the-counter ointments and using  baby wipe type cleansers with bowel movements.  This has really helped and she knows she can do the same thing if she has hemorrhoid flares again.  If this becomes recurrent and quite problematic then she might benefit from hemorrhoid therapy such as internal hemorrhoid banding if she has those or surgical resection of external hemorrhoids if she only has external hemorrhoids on upcoming colonoscopy. ? ?Please see the "Patient Instructions" section for addition details about the plan. ? ? ?Owens Loffler, MD ?[facility] ?07/26/2021, 9:59 AM ? ?Cc: Michael Boston, MD ? ?Total time on date of encounter was 46  minutes (this included time spent preparing to see the patient reviewing records; obtaining and/or reviewing separately obtained history; performing a medically appropriate exam and/or  evaluation; counseling and educating the patient and family if present; ordering medications, tests or procedures if applicable; and documenting clinical information in the health record). ? ? ? ? ? ?

## 2021-09-01 DIAGNOSIS — H5315 Visual distortions of shape and size: Secondary | ICD-10-CM | POA: Diagnosis not present

## 2021-09-01 DIAGNOSIS — H26493 Other secondary cataract, bilateral: Secondary | ICD-10-CM | POA: Diagnosis not present

## 2021-09-01 DIAGNOSIS — H18413 Arcus senilis, bilateral: Secondary | ICD-10-CM | POA: Diagnosis not present

## 2021-09-01 DIAGNOSIS — H26491 Other secondary cataract, right eye: Secondary | ICD-10-CM | POA: Diagnosis not present

## 2021-09-01 DIAGNOSIS — H02831 Dermatochalasis of right upper eyelid: Secondary | ICD-10-CM | POA: Diagnosis not present

## 2021-09-01 DIAGNOSIS — Z961 Presence of intraocular lens: Secondary | ICD-10-CM | POA: Diagnosis not present

## 2021-09-07 ENCOUNTER — Ambulatory Visit (AMBULATORY_SURGERY_CENTER): Payer: Medicare Other | Admitting: Gastroenterology

## 2021-09-07 ENCOUNTER — Encounter: Payer: Self-pay | Admitting: Gastroenterology

## 2021-09-07 VITALS — BP 125/70 | HR 55 | Temp 98.9°F | Resp 11 | Ht 65.75 in | Wt 181.0 lb

## 2021-09-07 DIAGNOSIS — I10 Essential (primary) hypertension: Secondary | ICD-10-CM | POA: Diagnosis not present

## 2021-09-07 DIAGNOSIS — Z1211 Encounter for screening for malignant neoplasm of colon: Secondary | ICD-10-CM

## 2021-09-07 DIAGNOSIS — E039 Hypothyroidism, unspecified: Secondary | ICD-10-CM | POA: Diagnosis not present

## 2021-09-07 MED ORDER — SODIUM CHLORIDE 0.9 % IV SOLN: 500.0000 mL | Freq: Once | INTRAVENOUS | Status: DC

## 2021-09-07 NOTE — Progress Notes (Signed)
PT taken to PACU. Monitors in place. VSS. Report given to RN. 

## 2021-09-07 NOTE — Patient Instructions (Signed)
Handouts provided on diverticulosis and hemorrhoids.   YOU HAD AN ENDOSCOPIC PROCEDURE TODAY AT [facility]:   Refer to the procedure report that was given to you for any specific questions about what was found during the examination.  If the procedure report does not answer your questions, please call your gastroenterologist to clarify.  If you requested that your care partner not be given the details of your procedure findings, then the procedure report has been included in a sealed envelope for you to review at your convenience later.  YOU SHOULD EXPECT: Some feelings of bloating in the abdomen. Passage of more gas than usual.  Walking can help get rid of the air that was put into your GI tract during the procedure and reduce the bloating. If you had a lower endoscopy (such as a colonoscopy or flexible sigmoidoscopy) you may notice spotting of blood in your stool or on the toilet paper. If you underwent a bowel prep for your procedure, you may not have a normal bowel movement for a few days.  Please Note:  You might notice some irritation and congestion in your nose or some drainage.  This is from the oxygen used during your procedure.  There is no need for concern and it should clear up in a day or so.  SYMPTOMS TO REPORT IMMEDIATELY:  Following lower endoscopy (colonoscopy or flexible sigmoidoscopy):  Excessive amounts of blood in the stool  Significant tenderness or worsening of abdominal pains  Swelling of the abdomen that is new, acute  Fever of 100F or higher  For urgent or emergent issues, a gastroenterologist can be reached at any hour by calling [phone]. Do not use MyChart messaging for urgent concerns.    DIET:  We do recommend a small meal at first, but then you may proceed to your regular diet.  Drink plenty of fluids but you should avoid alcoholic beverages for 24 hours.  ACTIVITY:  You should plan to take it easy for the rest of today and you should NOT  DRIVE or use heavy machinery until tomorrow (because of the sedation medicines used during the test).    FOLLOW UP: Our staff will call the number listed on your records 24-72 hours following your procedure to check on you and address any questions or concerns that you may have regarding the information given to you following your procedure. If we do not reach you, we will leave a message.  We will attempt to reach you two times.  During this call, we will ask if you have developed any symptoms of COVID 19. If you develop any symptoms (ie: fever, flu-like symptoms, shortness of breath, cough etc.) before then, please call [phone].  If you test positive for Covid 19 in the 2 weeks post procedure, please call and report this information to [place].    If any biopsies were taken you will be contacted by phone or by letter within the next 1-3 weeks.  Please call us at [phone] if you have not heard about the biopsies in 3 weeks.    SIGNATURES/CONFIDENTIALITY: You and/or your care partner have signed paperwork which will be entered into your electronic medical record.  These signatures attest to the fact that that the information above on your After Visit Summary has been reviewed and is understood.  Full responsibility of the confidentiality of this discharge information lies with you and/or your care-partner.

## 2021-09-07 NOTE — Progress Notes (Signed)
HPI: This is a woman at routine risk for CRC   ROS: complete GI ROS as described in HPI, all other review negative.  Constitutional:  No unintentional weight loss   Past Medical History:  Diagnosis Date   Allergy    Arthritis    back, hands   Basal cell carcinoma    face   Colon polyps    Complication of anesthesia    prolonged sedation, slow to wake up   Diplopia    Endometriosis    GERD (gastroesophageal reflux disease)    Headache    Migraines   Hyperlipidemia    Hypertension    Hypothyroidism    Skin cancer    hx of   Status post dilation of esophageal narrowing    Thalamic cyst    Thyroid disease    Vertigo     Past Surgical History:  Procedure Laterality Date   ANTERIOR AND POSTERIOR REPAIR WITH SACROSPINOUS FIXATION N/A 07/08/2015   Procedure: ANTERIOR AND POSTERIOR REPAIR ;  Surgeon: Louretta Shorten, MD;  Location: [facility];  Service: Gynecology;  Laterality: N/A;   COLONOSCOPY     DIAGNOSTIC LAPAROSCOPY     LAPAROSCOPIC VAGINAL HYSTERECTOMY WITH SALPINGO OOPHORECTOMY Bilateral 07/08/2015   Procedure: LAPAROSCOPIC ASSISTED VAGINAL HYSTERECTOMY WITH BILATERAL SALPINGO OOPHORECTOMY;  Surgeon: Louretta Shorten, MD;  Location: [facility];  Service: Gynecology;  Laterality: Bilateral;   OOPHORECTOMY  age 89   for endometrosis    Current Outpatient Medications  Medication Sig Dispense Refill   acetaminophen (TYLENOL) 500 MG tablet Take 1,000 mg by mouth 2 (two) times daily as needed for moderate pain or headache.      atenolol (TENORMIN) 50 MG tablet Take 1 tablet by mouth Daily.     Calcium Carb-Cholecalciferol 500-600 MG-UNIT TABS Take 1 tablet by mouth daily.     celecoxib (CELEBREX) 200 MG capsule Take 200 mg by mouth daily.     hydrochlorothiazide (MICROZIDE) 12.5 MG capsule Take 12.5 mg by mouth daily.     levothyroxine (SYNTHROID, LEVOTHROID) 75 MCG tablet Take 75 mcg by mouth at bedtime.      losartan (COZAAR) 100 MG tablet Take 100 mg by mouth daily.     meclizine  (ANTIVERT) 25 MG tablet Take 1 tablet (25 mg total) by mouth 3 (three) times daily as needed for dizziness. (Patient taking differently: Take 25 mg by mouth as needed for dizziness.) 30 tablet 0   omeprazole (PRILOSEC) 20 MG capsule Take 20 mg by mouth daily as needed (for heartburn).      Probiotic Product (PROBIOTIC PO) Take 1 capsule by mouth daily. Ultimate 16 strain w/ trace minerals & sos     tiZANidine (ZANAFLEX) 2 MG tablet Take 1 tablet (2 mg total) by mouth at bedtime. (Patient taking differently: Take 2 mg by mouth as needed.) 30 tablet 0   vitamin B-12 (CYANOCOBALAMIN) 1000 MCG tablet Take 1,000 mcg by mouth daily.     Vitamins A & D (VITAMIN A & D) 5000-400 UNITS CAPS Take 1 capsule by mouth daily.     Current Facility-Administered Medications  Medication Dose Route Frequency Provider Last Rate Last Admin   0.9 %  sodium chloride infusion  500 mL Intravenous Continuous Milus Banister, MD       0.9 %  sodium chloride infusion  500 mL Intravenous Once Milus Banister, MD        Allergies as of 09/07/2021 - Review Complete 09/07/2021  Allergen Reaction Noted   Demerol [meperidine]  Nausea Only and Other (See Comments) 01/22/2012    Family History  Problem Relation Age of Onset   Liver cancer Mother    Kidney cancer Mother    Lung cancer Mother    Heart disease Father    Hypertension Father    Cirrhosis Father    Macular degeneration Father    Diabetes Brother    Heart attack Brother    Breast cancer Maternal Grandmother    Stroke Maternal Grandmother    Colon cancer Neg Hx    Esophageal cancer Neg Hx    Stomach cancer Neg Hx     Social History   Socioeconomic History   Marital status: Widowed    Spouse name: Not on file   Number of children: 0   Years of education: Not on file   Highest education level: Bachelor's degree (e.g., BA, AB, BS)  Occupational History    Comment: retired  Tobacco Use   Smoking status: Never   Smokeless tobacco: Never  Vaping Use    Vaping Use: Never used  Substance and Sexual Activity   Alcohol use: Not Currently   Drug use: No   Sexual activity: Not on file  Other Topics Concern   Not on file  Social History Narrative   Lives alone   Social Determinants of Health   Financial Resource Strain: Not on file  Food Insecurity: Not on file  Transportation Needs: Not on file  Physical Activity: Not on file  Stress: Not on file  Social Connections: Not on file  Intimate Partner Violence: Not on file     Physical Exam: BP (!) 138/59   Pulse (!) 52   Temp 98.9 F (37.2 C)   Ht 5' 5.75" (1.67 m)   Wt 181 lb (82.1 kg)   SpO2 98%   BMI 29.44 kg/m  Constitutional: generally well-appearing Psychiatric: alert and oriented x3 Lungs: CTA bilaterally Heart: no MCR  Assessment and plan: 71 y.o. female with routine risk for CRC  Screening colonoscopy today  Care is appropriate for the ambulatory setting.  Owens Loffler, MD [facility] 09/07/2021, 1:14 PM

## 2021-09-07 NOTE — Op Note (Signed)
[facility] Patient Name: Abigail Robinson Procedure Date: 09/07/2021 1:05 PM MRN: [id number] Endoscopist: Milus Banister , MD Age: 71 Referring MD:  Date of Birth: 04-May-1950 Gender: Female Account #: [id number] Procedure:                Colonoscopy Indications:              Screening for colorectal malignant neoplasm Medicines:                Monitored Anesthesia Care Procedure:                Pre-Anesthesia Assessment:                           - Prior to the procedure, a History and Physical                            was performed, and patient medications and                            allergies were reviewed. The patient's tolerance of                            previous anesthesia was also reviewed. The risks                            and benefits of the procedure and the sedation                            options and risks were discussed with the patient.                            All questions were answered, and informed consent                            was obtained. Prior Anticoagulants: The patient has                            taken no previous anticoagulant or antiplatelet                            agents. ASA Grade Assessment: II - A patient with                            mild systemic disease. After reviewing the risks                            and benefits, the patient was deemed in                            satisfactory condition to undergo the procedure.                           After obtaining informed consent, the colonoscope  was passed under direct vision. Throughout the                            procedure, the patient's blood pressure, pulse, and                            oxygen saturations were monitored continuously. The                            Olympus CF-HQ190L (Serial# [id number]) Colonoscope was                            introduced through the anus and advanced to the the                            cecum,  identified by appendiceal orifice and                            ileocecal valve. The colonoscopy was performed                            without difficulty. The patient tolerated the                            procedure well. The quality of the bowel                            preparation was good. The ileocecal valve,                            appendiceal orifice, and rectum were photographed. Scope In: 1:34:25 PM Scope Out: 1:47:43 PM Scope Withdrawal Time: 0 hours 11 minutes 4 seconds  Total Procedure Duration: 0 hours 13 minutes 18 seconds  Findings:                 Multiple small and large-mouthed diverticula were                            found in the left colon.                           External and internal hemorrhoids were found. The                            hemorrhoids were small.                           The exam was otherwise without abnormality on                            direct and retroflexion views. Complications:            No immediate complications. Estimated blood loss:                            None. Estimated  Blood Loss:     Estimated blood loss: none. Impression:               - Diverticulosis in the left colon.                           - External and internal hemorrhoids.                           - The examination was otherwise normal on direct                            and retroflexion views.                           - No polyps or cancers. Recommendation:           - Patient has a contact number available for                            emergencies. The signs and symptoms of potential                            delayed complications were discussed with the                            patient. Return to normal activities tomorrow.                            Written discharge instructions were provided to the                            patient.                           - Resume previous diet.                           - Continue present  medications.                           - You do not need any further colon cancer                            screening tests (including stool testing). These                            types of tests generally stop around age 92-80. Milus Banister, MD 09/07/2021 1:51:08 PM This report has been signed electronically.

## 2021-09-07 NOTE — Progress Notes (Signed)
Pt's states no medical or surgical changes since previsit or office visit. 

## 2021-09-08 ENCOUNTER — Telehealth: Payer: Self-pay

## 2021-09-08 NOTE — Telephone Encounter (Signed)
  Follow up Call-     09/07/2021    1:09 PM  Call back number  Post procedure Call Back phone  # [phone]  Permission to leave phone message Yes     Patient questions:  Do you have a fever, pain , or abdominal swelling? No. Pain Score  0 *  Have you tolerated food without any problems? Yes.    Have you been able to return to your normal activities? Yes.    Do you have any questions about your discharge instructions: Diet   No. Medications  No. Follow up visit  No.  Do you have questions or concerns about your Care? No.  Actions: * If pain score is 4 or above: No action needed, pain <4.

## 2021-10-13 DIAGNOSIS — Z1152 Encounter for screening for COVID-19: Secondary | ICD-10-CM | POA: Diagnosis not present

## 2021-10-13 DIAGNOSIS — J029 Acute pharyngitis, unspecified: Secondary | ICD-10-CM | POA: Diagnosis not present

## 2021-10-13 DIAGNOSIS — R0981 Nasal congestion: Secondary | ICD-10-CM | POA: Diagnosis not present

## 2021-10-13 DIAGNOSIS — R059 Cough, unspecified: Secondary | ICD-10-CM | POA: Diagnosis not present

## 2021-10-13 DIAGNOSIS — U071 COVID-19: Secondary | ICD-10-CM | POA: Diagnosis not present

## 2021-10-13 DIAGNOSIS — R519 Headache, unspecified: Secondary | ICD-10-CM | POA: Diagnosis not present

## 2021-10-27 DIAGNOSIS — H26492 Other secondary cataract, left eye: Secondary | ICD-10-CM | POA: Diagnosis not present

## 2021-11-22 ENCOUNTER — Other Ambulatory Visit: Payer: Self-pay | Admitting: Internal Medicine

## 2021-11-22 DIAGNOSIS — Z1231 Encounter for screening mammogram for malignant neoplasm of breast: Secondary | ICD-10-CM

## 2021-11-23 ENCOUNTER — Other Ambulatory Visit: Payer: Self-pay | Admitting: Internal Medicine

## 2021-11-23 DIAGNOSIS — G9389 Other specified disorders of brain: Secondary | ICD-10-CM

## 2021-11-24 NOTE — Progress Notes (Unsigned)
Abigail Robinson [address] [place] Phone: [phone] Subjective:   [name], am serving as a scribe for Dr. Hulan Saas.  I'm seeing this patient by the request  of:  Michael Boston, MD  CC: Right-sided back pain  [id number]  Abigail Robinson is a 71 y.o. female coming in with complaint of R sided back pain. Last seen in June 2022. Patient states pain starts in hip and radiates down to the top of foot. Took tizanidine and that helped. Dull ache on constant basis. Moving around and walking seems to help. Figure 4 stretch seems to help as well.       Past Medical History:  Diagnosis Date   Allergy    Arthritis    back, hands   Basal cell carcinoma    face   Colon polyps    Complication of anesthesia    prolonged sedation, slow to wake up   Diplopia    Endometriosis    GERD (gastroesophageal reflux disease)    Headache    Migraines   Hyperlipidemia    Hypertension    Hypothyroidism    Skin cancer    hx of   Status post dilation of esophageal narrowing    Thalamic cyst    Thyroid disease    Vertigo    Past Surgical History:  Procedure Laterality Date   ANTERIOR AND POSTERIOR REPAIR WITH SACROSPINOUS FIXATION N/A 07/08/2015   Procedure: ANTERIOR AND POSTERIOR REPAIR ;  Surgeon: Louretta Shorten, MD;  Location: [facility];  Service: Gynecology;  Laterality: N/A;   COLONOSCOPY     DIAGNOSTIC LAPAROSCOPY     LAPAROSCOPIC VAGINAL HYSTERECTOMY WITH SALPINGO OOPHORECTOMY Bilateral 07/08/2015   Procedure: LAPAROSCOPIC ASSISTED VAGINAL HYSTERECTOMY WITH BILATERAL SALPINGO OOPHORECTOMY;  Surgeon: Louretta Shorten, MD;  Location: [facility];  Service: Gynecology;  Laterality: Bilateral;   OOPHORECTOMY  age 66   for endometrosis   Social History   Socioeconomic History   Marital status: Widowed    Spouse name: Not on file   Number of children: 0   Years of education: Not on file   Highest education level: Bachelor's degree (e.g., BA,  AB, BS)  Occupational History    Comment: retired  Tobacco Use   Smoking status: Never   Smokeless tobacco: Never  Vaping Use   Vaping Use: Never used  Substance and Sexual Activity   Alcohol use: Not Currently   Drug use: No   Sexual activity: Not on file  Other Topics Concern   Not on file  Social History Narrative   Lives alone   Social Determinants of Health   Financial Resource Strain: Not on file  Food Insecurity: Not on file  Transportation Needs: Not on file  Physical Activity: Not on file  Stress: Not on file  Social Connections: Not on file   Allergies  Allergen Reactions   Demerol [Meperidine] Nausea Only and Other (See Comments)    Reaction=headache   Family History  Problem Relation Age of Onset   Liver cancer Mother    Kidney cancer Mother    Lung cancer Mother    Heart disease Father    Hypertension Father    Cirrhosis Father    Macular degeneration Father    Diabetes Brother    Heart attack Brother    Breast cancer Maternal Grandmother    Stroke Maternal Grandmother    Colon cancer Neg Hx    Esophageal cancer Neg Hx  Stomach cancer Neg Hx     Current Outpatient Medications (Endocrine & Metabolic):    levothyroxine (SYNTHROID, LEVOTHROID) 75 MCG tablet, Take 75 mcg by mouth at bedtime.   Current Outpatient Medications (Cardiovascular):    atenolol (TENORMIN) 50 MG tablet, Take 1 tablet by mouth Daily.   hydrochlorothiazide (MICROZIDE) 12.5 MG capsule, Take 12.5 mg by mouth daily.   losartan (COZAAR) 100 MG tablet, Take 100 mg by mouth daily.   Current Outpatient Medications (Analgesics):    acetaminophen (TYLENOL) 500 MG tablet, Take 1,000 mg by mouth 2 (two) times daily as needed for moderate pain or headache.    celecoxib (CELEBREX) 200 MG capsule, Take 200 mg by mouth daily.  Current Outpatient Medications (Hematological):    vitamin B-12 (CYANOCOBALAMIN) 1000 MCG tablet, Take 1,000 mcg by mouth daily.  Current Outpatient  Medications (Other):    gabapentin (NEURONTIN) 100 MG capsule, Take 2 capsules (200 mg total) by mouth at bedtime.   Calcium Carb-Cholecalciferol 500-600 MG-UNIT TABS, Take 1 tablet by mouth daily.   meclizine (ANTIVERT) 25 MG tablet, Take 1 tablet (25 mg total) by mouth 3 (three) times daily as needed for dizziness. (Patient not taking: Reported on 09/07/2021)   omeprazole (PRILOSEC) 20 MG capsule, Take 20 mg by mouth daily as needed (for heartburn).    Probiotic Product (PROBIOTIC PO), Take 1 capsule by mouth daily. Ultimate 16 strain w/ trace minerals & sos   tiZANidine (ZANAFLEX) 2 MG tablet, Take 1 tablet (2 mg total) by mouth at bedtime. (Patient taking differently: Take 2 mg by mouth as needed.)   Vitamins A & D (VITAMIN A & D) 5000-400 UNITS CAPS, Take 1 capsule by mouth daily.   Reviewed prior external information including notes and imaging from  primary care provider As well as notes that were available from care everywhere and other healthcare systems.  Past medical history, social, surgical and family history all reviewed in electronic medical record.  No pertanent information unless stated regarding to the chief complaint.   Review of Systems:  No headache, visual changes, nausea, vomiting, diarrhea, constipation, dizziness, abdominal pain, skin rash, fevers, chills, night sweats, weight loss, swollen lymph nodes,  joint swelling, chest pain, shortness of breath, mood changes. POSITIVE muscle aches, body aches  Objective  Blood pressure 104/70, pulse 72, height '5\' 5"'$  (1.651 m), weight 184 lb (83.5 kg), SpO2 94 %.   General: No apparent distress alert and oriented x3 mood and affect normal, dressed appropriately.  HEENT: Pupils equal, extraocular movements intact  Respiratory: Patient's speak in full sentences and does not appear short of breath  Cardiovascular: No lower extremity edema, non tender, no erythema  Back patient does have some loss of lordosis.  Some increasing  degenerative scoliosis.  Patient does have tightness with straight leg test especially on the right side with some radicular symptoms in the L4 and L5 distribution.  4 out of 5 strength in lower extremities but symmetric bilaterally.   [id number]; 15 additional minutes spent for Therapeutic exercises as stated in above notes.  This included exercises focusing on stretching, strengthening, with significant focus on eccentric aspects.   Long term goals include an improvement in range of motion, strength, endurance as well as avoiding reinjury. Patient's frequency would include in 1-2 times a day, 3-5 times a week for a duration of 6-12 weeks. Low back exercises that included:  Pelvic tilt/bracing instruction to focus on control of the pelvic girdle and lower abdominal muscles  Glute strengthening exercises, focusing  on proper firing of the glutes without engaging the low back muscles Proper stretching techniques for maximum relief for the hamstrings, hip flexors, low back and some rotation where tolerated   Proper technique shown and discussed handout in great detail with ATC.  All questions were discussed and answered.     Impression and Recommendations:    The above documentation has been reviewed and is accurate and complete Lyndal Pulley, DO

## 2021-11-28 ENCOUNTER — Ambulatory Visit (INDEPENDENT_AMBULATORY_CARE_PROVIDER_SITE_OTHER): Payer: Medicare Other | Admitting: Family Medicine

## 2021-11-28 VITALS — BP 104/70 | HR 72 | Ht 65.0 in | Wt 184.0 lb

## 2021-11-28 DIAGNOSIS — G8929 Other chronic pain: Secondary | ICD-10-CM

## 2021-11-28 DIAGNOSIS — M5136 Other intervertebral disc degeneration, lumbar region: Secondary | ICD-10-CM

## 2021-11-28 DIAGNOSIS — M545 Low back pain, unspecified: Secondary | ICD-10-CM

## 2021-11-28 MED ORDER — METHYLPREDNISOLONE ACETATE 40 MG/ML IJ SUSP
40.0000 mg | Freq: Once | INTRAMUSCULAR | Status: AC
  Administered 2021-11-28: 40 mg via INTRAMUSCULAR

## 2021-11-28 MED ORDER — KETOROLAC TROMETHAMINE 30 MG/ML IJ SOLN
30.0000 mg | Freq: Once | INTRAMUSCULAR | Status: AC
  Administered 2021-11-28: 30 mg via INTRAMUSCULAR

## 2021-11-28 MED ORDER — GABAPENTIN 100 MG PO CAPS: 200.0000 mg | ORAL_CAPSULE | Freq: Every day | ORAL | 0 refills | Status: DC

## 2021-11-28 NOTE — Assessment & Plan Note (Signed)
Arthritic changes.  Worsening symptoms.  Does have Celebrex but I do feel that a Toradol and Depo-Medrol injections will be more beneficial.  Patient knows to hold the Celebrex for 1 day.  Warned of potential side effects.  We discussed gabapentin but I think 200 mg at night and will help out significantly for more than neurologic component.  Patient will be traveling here relatively shortly and given some home exercises and went over them with her athletic trainer.  Follow-up with me again in 4 weeks.  This will be best for patient's trip and will consider the injections and possibly giving patient a wait-and-see prednisone for the trip

## 2021-11-28 NOTE — Patient Instructions (Addendum)
Gabapentin '200mg'$  at night Can continue muscle relaxer if needed Do prescribed exercises at least 3x a week Injection today for pain

## 2021-12-10 ENCOUNTER — Ambulatory Visit
Admission: RE | Admit: 2021-12-10 | Discharge: 2021-12-10 | Disposition: A | Discharge status: Home / Self Care | Payer: Medicare Other | Source: Ambulatory Visit | Attending: Internal Medicine | Admitting: Internal Medicine

## 2021-12-10 DIAGNOSIS — G9389 Other specified disorders of brain: Secondary | ICD-10-CM

## 2021-12-10 MED ORDER — GADOBENATE DIMEGLUMINE 529 MG/ML IV SOLN
17.0000 mL | Freq: Once | INTRAVENOUS | Status: AC | PRN
  Administered 2021-12-10: 17 mL via INTRAVENOUS

## 2022-01-03 ENCOUNTER — Ambulatory Visit: Payer: Medicare Other | Admitting: Family Medicine

## 2022-01-03 NOTE — Progress Notes (Unsigned)
[place] Phone: [phone] Subjective:   Abigail Robinson, am serving as a scribe for Dr. Hulan Saas.  I'm seeing this patient by the request  of:  Michael Boston, MD  CC: Neck and low back pain  OEV:[id number]  11/28/2021 Arthritic changes.  Worsening symptoms.  Does have Celebrex but I do feel that a Toradol and Depo-Medrol injections will be more beneficial.  Patient knows to hold the Celebrex for 1 day.  Warned of potential side effects.  We discussed gabapentin but I think 200 mg at night and will help out significantly for more than neurologic component.  Patient will be traveling here relatively shortly and given some home exercises and went over them with her athletic trainer.  Follow-up with me again in 4 weeks.  This will be best for patient's trip and will consider the injections and possibly giving patient a wait-and-see prednisone for the trip  Update 01/04/2022 Abigail Robinson is a 71 y.o. female coming in with complaint of lumbar DDD. Patient states that injections did help. Nerve pain has subsided but stlil has achy back and hips. Walked a lot in [place] but her pain was less than it has been. Using gabapentin and zanaflex which is also helpful. Using Tylenol in mornings if she has pain.   Also c/o tightness in her neck. Has been consistent with HEP. Had MRI of brain recently. When patient flexes cervical spine and looks down and then looks up she will have a shadow  of letters in white in her field of vision in both eyes and can sometimes be just one eye at at time. Wants to know if there is a connection with her neck tightness. Has seen many providers and had procedures including optic nerve scan, removal of cataracts.       Past Medical History:  Diagnosis Date   Allergy    Arthritis    back, hands   Basal cell carcinoma    face   Colon polyps    Complication of anesthesia    prolonged  sedation, slow to wake up   Diplopia    Endometriosis    GERD (gastroesophageal reflux disease)    Headache    Migraines   Hyperlipidemia    Hypertension    Hypothyroidism    Skin cancer    hx of   Status post dilation of esophageal narrowing    Thalamic cyst    Thyroid disease    Vertigo    Past Surgical History:  Procedure Laterality Date   ANTERIOR AND POSTERIOR REPAIR WITH SACROSPINOUS FIXATION N/A 07/08/2015   Procedure: ANTERIOR AND POSTERIOR REPAIR ;  Surgeon: Louretta Shorten, MD;  Location: [facility];  Service: Gynecology;  Laterality: N/A;   COLONOSCOPY     DIAGNOSTIC LAPAROSCOPY     LAPAROSCOPIC VAGINAL HYSTERECTOMY WITH SALPINGO OOPHORECTOMY Bilateral 07/08/2015   Procedure: LAPAROSCOPIC ASSISTED VAGINAL HYSTERECTOMY WITH BILATERAL SALPINGO OOPHORECTOMY;  Surgeon: Louretta Shorten, MD;  Location: [facility];  Service: Gynecology;  Laterality: Bilateral;   OOPHORECTOMY  age 56   for endometrosis   Social History   Socioeconomic History   Marital status: Widowed    Spouse name: Not on file   Number of children: 0   Years of education: Not on file   Highest education level: Bachelor's degree (e.g., BA, AB, BS)  Occupational History    Comment: retired  Tobacco Use   Smoking status: Never  Smokeless tobacco: Never  Vaping Use   Vaping Use: Never used  Substance and Sexual Activity   Alcohol use: Not Currently   Drug use: Robinson   Sexual activity: Not on file  Other Topics Concern   Not on file  Social History Narrative   Lives alone   Social Determinants of Health   Financial Resource Strain: Not on file  Food Insecurity: Not on file  Transportation Needs: Not on file  Physical Activity: Not on file  Stress: Not on file  Social Connections: Not on file   Allergies  Allergen Reactions   Demerol [Meperidine] Nausea Only and Other (See Comments)    Reaction=headache   Family History  Problem Relation Age of Onset   Liver cancer Mother    Kidney cancer Mother    Lung  cancer Mother    Heart disease Father    Hypertension Father    Cirrhosis Father    Macular degeneration Father    Diabetes Brother    Heart attack Brother    Breast cancer Maternal Grandmother    Stroke Maternal Grandmother    Colon cancer Neg Hx    Esophageal cancer Neg Hx    Stomach cancer Neg Hx     Current Outpatient Medications (Endocrine & Metabolic):    levothyroxine (SYNTHROID, LEVOTHROID) 75 MCG tablet, Take 75 mcg by mouth at bedtime.   Current Outpatient Medications (Cardiovascular):    atenolol (TENORMIN) 50 MG tablet, Take 1 tablet by mouth Daily.   hydrochlorothiazide (MICROZIDE) 12.5 MG capsule, Take 12.5 mg by mouth daily.   losartan (COZAAR) 100 MG tablet, Take 100 mg by mouth daily.   Current Outpatient Medications (Analgesics):    acetaminophen (TYLENOL) 500 MG tablet, Take 1,000 mg by mouth 2 (two) times daily as needed for moderate pain or headache.    celecoxib (CELEBREX) 200 MG capsule, Take 200 mg by mouth daily.  Current Outpatient Medications (Hematological):    vitamin B-12 (CYANOCOBALAMIN) 1000 MCG tablet, Take 1,000 mcg by mouth daily.  Current Outpatient Medications (Other):    Calcium Carb-Cholecalciferol 500-600 MG-UNIT TABS, Take 1 tablet by mouth daily.   gabapentin (NEURONTIN) 100 MG capsule, Take 2 capsules (200 mg total) by mouth at bedtime.   meclizine (ANTIVERT) 25 MG tablet, Take 1 tablet (25 mg total) by mouth 3 (three) times daily as needed for dizziness.   omeprazole (PRILOSEC) 20 MG capsule, Take 20 mg by mouth daily as needed (for heartburn).    Probiotic Product (PROBIOTIC PO), Take 1 capsule by mouth daily. Ultimate 16 strain w/ trace minerals & sos   tiZANidine (ZANAFLEX) 2 MG tablet, Take 1 tablet (2 mg total) by mouth at bedtime. (Patient taking differently: Take 2 mg by mouth as needed.)   Vitamins A & D (VITAMIN A & D) 5000-400 UNITS CAPS, Take 1 capsule by mouth daily.   Reviewed prior external information including notes  and imaging from  primary care provider As well as notes that were available from care everywhere and other healthcare systems.  Past medical history, social, surgical and family history all reviewed in electronic medical record.  Robinson pertanent information unless stated regarding to the chief complaint.   Review of Systems:  Robinson  nausea, vomiting, diarrhea, constipation,  abdominal pain, skin rash, fevers, chills, night sweats, weight loss, swollen lymph nodes, body aches, joint swelling, chest pain, shortness of breath, mood changes. POSITIVE muscle aches, visual changes, intermittent headache, questionable dizziness with patient having the double vision.  Objective  Blood pressure 98/68, pulse 60, height '5\' 5"'$  (1.651 m), weight 184 lb (83.5 kg), SpO2 97 %.   General: Robinson apparent distress alert and oriented x3 mood and affect normal, dressed appropriately.  HEENT: Pupils equal, extraocular movements intact  Respiratory: Patient's speak in full sentences and does not appear short of breath  Cardiovascular: Robinson lower extremity edema, non tender, Robinson erythema  Arthritic changes of multiple joints.  Patient does seem to have more difficulty with extension of the neck.  Patient says started having some of the mild double vision only in the left eye and more in her medial gaze.  5 out of 5 strength of the face noted at that time as well as.    Impression and Recommendations:    The above documentation has been reviewed and is accurate and complete Lyndal Pulley, DO

## 2022-01-04 ENCOUNTER — Ambulatory Visit (INDEPENDENT_AMBULATORY_CARE_PROVIDER_SITE_OTHER): Payer: Medicare Other | Admitting: Family Medicine

## 2022-01-04 VITALS — BP 98/68 | HR 60 | Ht 65.0 in | Wt 184.0 lb

## 2022-01-04 DIAGNOSIS — H539 Unspecified visual disturbance: Secondary | ICD-10-CM | POA: Diagnosis not present

## 2022-01-04 DIAGNOSIS — M5030 Other cervical disc degeneration, unspecified cervical region: Secondary | ICD-10-CM

## 2022-01-04 DIAGNOSIS — M542 Cervicalgia: Secondary | ICD-10-CM

## 2022-01-04 DIAGNOSIS — H532 Diplopia: Secondary | ICD-10-CM | POA: Insufficient documentation

## 2022-01-04 NOTE — Assessment & Plan Note (Signed)
Patient continues to have some difficulty and double vision.  Patient is stating that it does last 10 to 15 minutes and only happens when she goes from a flexed position and then activity extension.  Can be other but recently she says that it is lateralized to the left mostly.  Seems to be entire eye and not the visual field.  Patient did have a regular overall regular and the brain the patient does have a cyst in the pelvis but not will be consistent with the findings.  No headache associated with it.  I do feel at this point an MR angiogram of the cervical neck would be beneficial to make sure there is no stenotic lesion or any aneurysm or torturous blood vessel that could be contributing to this.  Patient has been taking some anti-inflammatories and encouraged her to monitor taking too much at the moment.  Patient noted significant worsening symptoms to seek medical attention immediately.  After imaging we will discuss further.  Total time reviewing patient's MRI, as well as reviewing the differential with patient 36 minutes

## 2022-01-04 NOTE — Patient Instructions (Addendum)
MRA neck [phone] We will be in touch

## 2022-01-10 ENCOUNTER — Ambulatory Visit
Admission: RE | Admit: 2022-01-10 | Discharge: 2022-01-10 | Disposition: A | Discharge status: Home / Self Care | Payer: Medicare Other | Source: Ambulatory Visit | Attending: Internal Medicine | Admitting: Internal Medicine

## 2022-01-10 DIAGNOSIS — Z1231 Encounter for screening mammogram for malignant neoplasm of breast: Secondary | ICD-10-CM

## 2022-01-20 ENCOUNTER — Ambulatory Visit
Admission: RE | Admit: 2022-01-20 | Discharge: 2022-01-20 | Disposition: A | Discharge status: Home / Self Care | Payer: Medicare Other | Source: Ambulatory Visit | Attending: Family Medicine | Admitting: Family Medicine

## 2022-01-20 ENCOUNTER — Inpatient Hospital Stay: Admission: RE | Admit: 2022-01-20 | Payer: Medicare Other | Source: Ambulatory Visit

## 2022-01-20 DIAGNOSIS — H532 Diplopia: Secondary | ICD-10-CM

## 2022-01-20 MED ORDER — GADOPICLENOL 0.5 MMOL/ML IV SOLN
8.0000 mL | Freq: Once | INTRAVENOUS | Status: AC | PRN
  Administered 2022-01-20: 8 mL via INTRAVENOUS

## 2022-02-16 DIAGNOSIS — I10 Essential (primary) hypertension: Secondary | ICD-10-CM | POA: Diagnosis not present

## 2022-02-16 DIAGNOSIS — R7989 Other specified abnormal findings of blood chemistry: Secondary | ICD-10-CM | POA: Diagnosis not present

## 2022-02-16 DIAGNOSIS — E039 Hypothyroidism, unspecified: Secondary | ICD-10-CM | POA: Diagnosis not present

## 2022-02-16 DIAGNOSIS — E785 Hyperlipidemia, unspecified: Secondary | ICD-10-CM | POA: Diagnosis not present

## 2022-02-23 DIAGNOSIS — Z1331 Encounter for screening for depression: Secondary | ICD-10-CM | POA: Diagnosis not present

## 2022-02-23 DIAGNOSIS — Z23 Encounter for immunization: Secondary | ICD-10-CM | POA: Diagnosis not present

## 2022-02-23 DIAGNOSIS — M19049 Primary osteoarthritis, unspecified hand: Secondary | ICD-10-CM | POA: Diagnosis not present

## 2022-02-23 DIAGNOSIS — I129 Hypertensive chronic kidney disease with stage 1 through stage 4 chronic kidney disease, or unspecified chronic kidney disease: Secondary | ICD-10-CM | POA: Diagnosis not present

## 2022-02-23 DIAGNOSIS — E785 Hyperlipidemia, unspecified: Secondary | ICD-10-CM | POA: Diagnosis not present

## 2022-02-23 DIAGNOSIS — Z0000 Encounter for general adult medical examination without abnormal findings: Secondary | ICD-10-CM | POA: Diagnosis not present

## 2022-02-23 DIAGNOSIS — K219 Gastro-esophageal reflux disease without esophagitis: Secondary | ICD-10-CM | POA: Diagnosis not present

## 2022-02-23 DIAGNOSIS — Z6828 Body mass index (BMI) 28.0-28.9, adult: Secondary | ICD-10-CM | POA: Diagnosis not present

## 2022-02-23 DIAGNOSIS — E663 Overweight: Secondary | ICD-10-CM | POA: Diagnosis not present

## 2022-02-23 DIAGNOSIS — M85859 Other specified disorders of bone density and structure, unspecified thigh: Secondary | ICD-10-CM | POA: Diagnosis not present

## 2022-02-23 DIAGNOSIS — Z1339 Encounter for screening examination for other mental health and behavioral disorders: Secondary | ICD-10-CM | POA: Diagnosis not present

## 2022-02-23 DIAGNOSIS — N1831 Chronic kidney disease, stage 3a: Secondary | ICD-10-CM | POA: Diagnosis not present

## 2022-03-09 IMAGING — MR MR HEAD WO/W CM
14 series · 48 of 48 positions shown · IV contrast (gadavist)
Comparison: MRI head 04/29/2004

CLINICAL DATA: Chronic diplopia.  Slurred speech.

EXAM:
MRI HEAD WITHOUT AND WITH CONTRAST
TECHNIQUE: Multiplanar, multiecho pulse sequences of the brain and surrounding
structures were obtained without and with intravenous contrast.
CONTRAST:  9mL GADAVIST GADOBUTROL 1 MMOL/ML IV SOLN

[Series 5: DWI · axial · 3.0mm · 1.36mm/px · z∈[-66,+92]mm · 5 of 108 slices shown (1 of 4)]
[im 1/108]
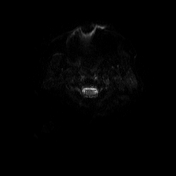
[im 27/108]
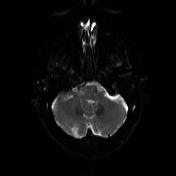
[im 54/108]
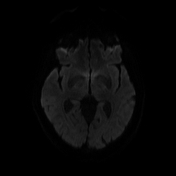
[im 81/108]
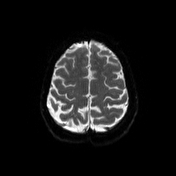
[im 108/108]
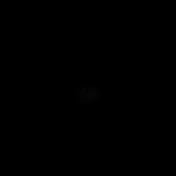

[Series 6: DWI · axial · 3.0mm · 1.36mm/px · z∈[-66,+92]mm · 3 of 54 slices shown (2 of 4)]
[im 1/54]
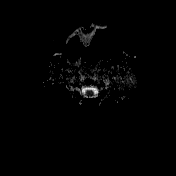
[im 27/54]
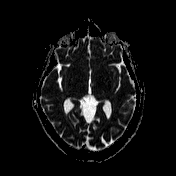
[im 54/54]
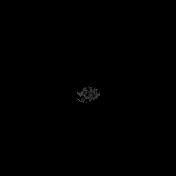

[Series 7: FLAIR · axial · 3.0mm · 0.66mm/px · z∈[-71,+87]mm · 3 of 54 slices shown]
[im 1/54]
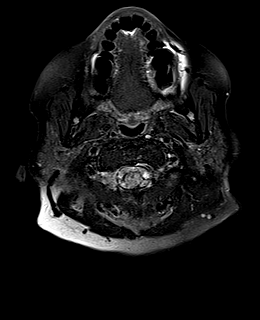
[im 27/54]
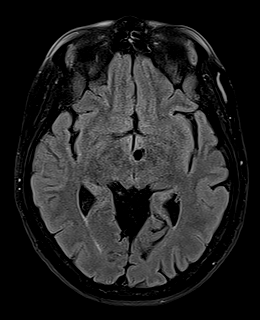
[im 54/54]
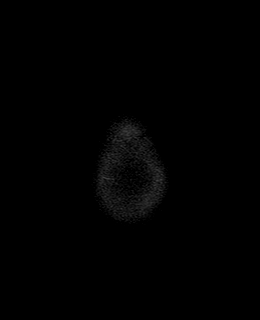

[Series 8: mip_images(sw) · axial · 24.0mm · 0.66mm/px · z∈[-64,+79]mm · 3 of 49 slices shown]
[im 1/49]
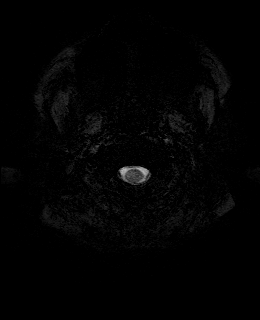
[im 25/49]
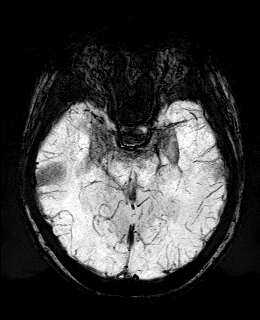
[im 49/49]
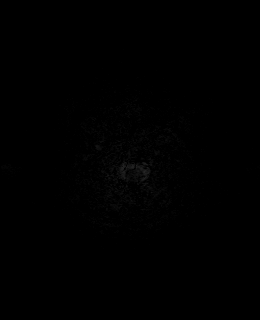

[Series 9: swi_images · axial · 3.0mm · 0.66mm/px · z∈[-74,+89]mm · 3 of 56 slices shown]
[im 1/56]
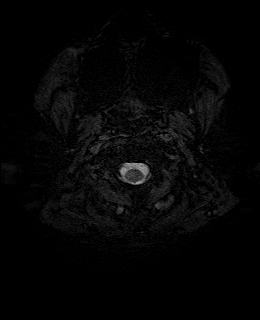
[im 28/56]
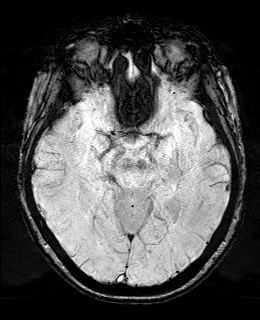
[im 56/56]
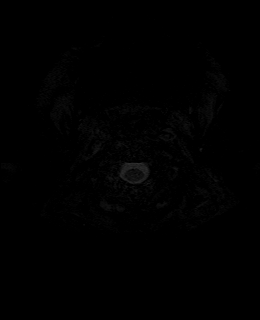

[Series 10: DWI · coronal · 5.0mm · 1.31mm/px · 4 of 72 slices shown (3 of 4)]
[im 1/72]
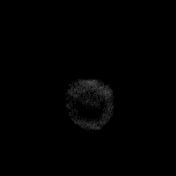
[im 24/72]
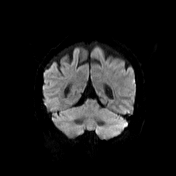
[im 48/72]
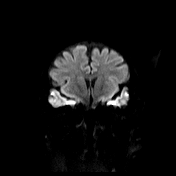
[im 72/72]
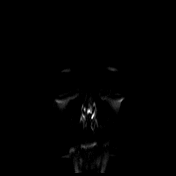

[Series 11: DWI · coronal · 5.0mm · 1.31mm/px · 2 of 36 slices shown (4 of 4)]
[im 1/36]
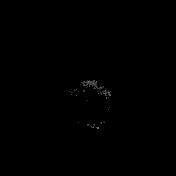
[im 36/36]
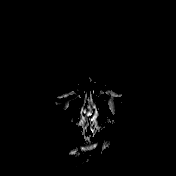

[Series 12: T1 · sagittal · 5.0mm · 0.75mm/px · 1 of 25 slices shown (1 of 2)]
[im 1/25]
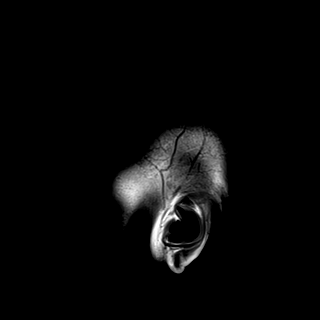

[Series 13: T2 · axial · 5.0mm · 0.55mm/px · 1 of 25 slices shown (1 of 2)]
[im 1/25]
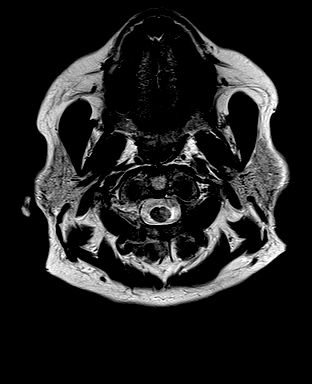

[Series 14: T1 · axial · 1.0mm · 0.82mm/px · z∈[-69,+89]mm · 9 of 160 slices shown (2 of 2)]
[im 1/160]
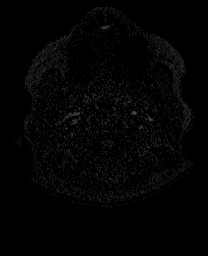
[im 20/160]
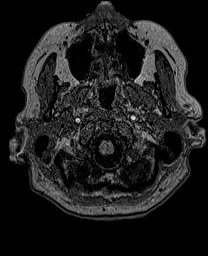
[im 40/160]
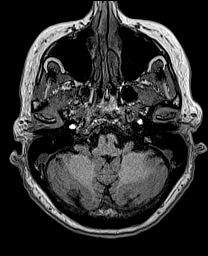
[im 60/160]
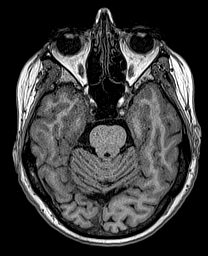
[im 80/160]
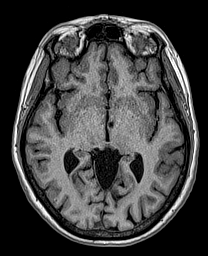
[im 100/160]
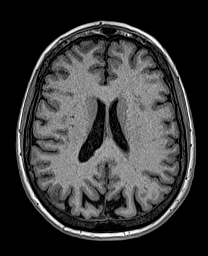
[im 120/160]
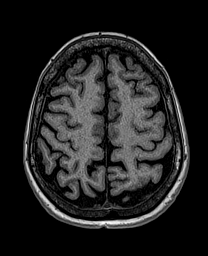
[im 140/160]
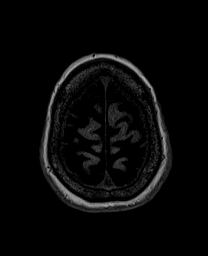
[im 160/160]
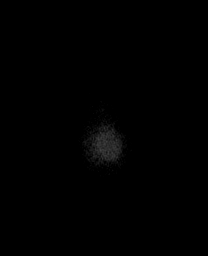

[Series 15: T2 · coronal · 5.0mm · 0.57mm/px · 2 of 28 slices shown (2 of 2)]
[im 1/28]
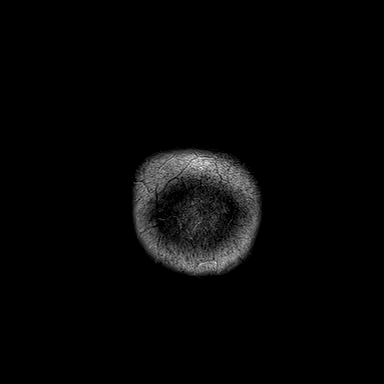
[im 28/28]
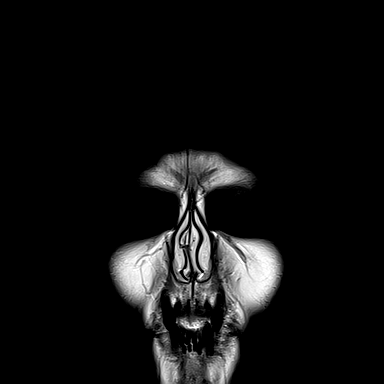

[Series 16: T1 post-contrast · axial · 1.0mm · 0.82mm/px · z∈[-69,+89]mm · 9 of 160 slices shown (1 of 3)]
[im 1/160]
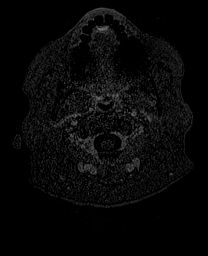
[im 20/160]
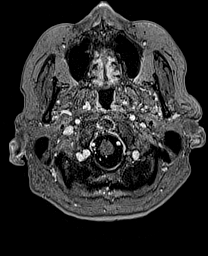
[im 40/160]
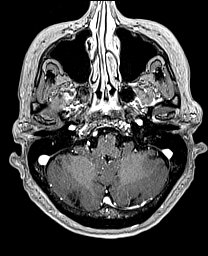
[im 60/160]
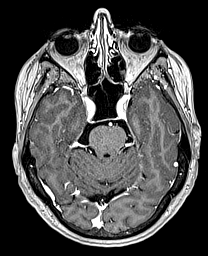
[im 80/160]
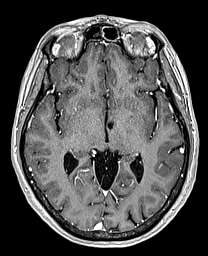
[im 100/160]
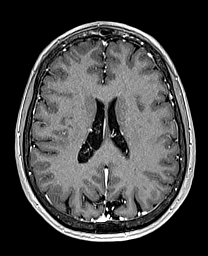
[im 120/160]
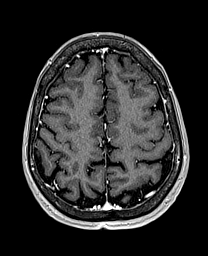
[im 140/160]
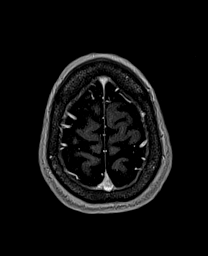
[im 160/160]
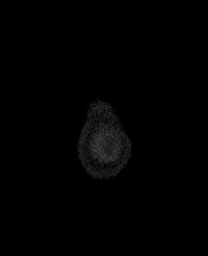

[Series 17: T1 post-contrast · coronal · 5.0mm · 0.43mm/px · 2 of 28 slices shown (2 of 3)]
[im 1/28]
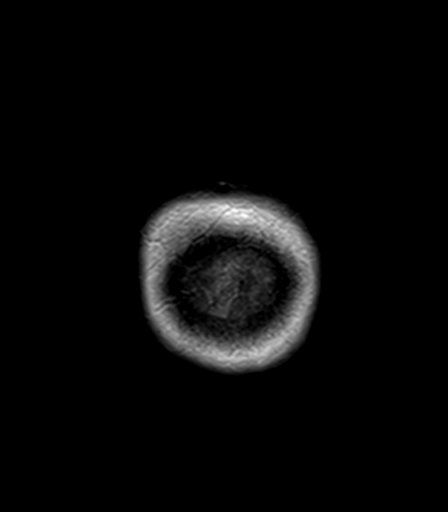
[im 28/28]
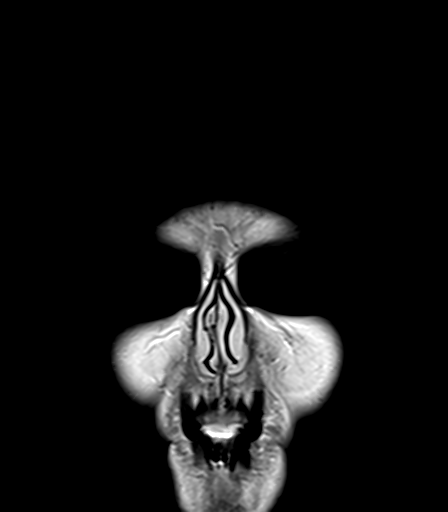

[Series 18: T1 post-contrast · sagittal · 5.0mm · 0.75mm/px · 1 of 25 slices shown (3 of 3)]
[im 1/25]
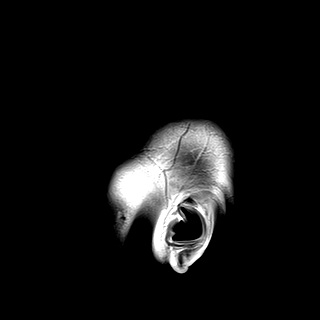

[48 of 48 positions shown; findings below may reference images not displayed]

FINDINGS: Brain: Negative for acute infarct. Ventricle size normal. Cerebral
white matter normal. No mass lesion. Normal enhancement

4 x 8 mm cyst left inferior thalamus shows interval growth since the
prior study when this measured approximately 3 x 4 mm. This fluid
follows CSF on all sequences without enhancement. Mild mass-effect
on the posterior third ventricle.

Vascular: Normal arterial flow voids.

Skull and upper cervical spine: No focal skeletal abnormality.

Sinuses/Orbits: Paranasal sinuses clear.  Negative orbit

Other: None
IMPRESSION: No acute abnormality

4 x 8 mm simple cyst left inferior thalamus with interval
enlargement since 9990. Possible dilated perivascular space.

## 2022-04-04 NOTE — Progress Notes (Signed)
[place] Phone: [phone] Subjective:   Abigail Robinson, am serving as a scribe for Dr. Hulan Saas.  I'm seeing this patient by the request  of:  Michael Boston, MD  CC: Left shoulder and neck pain  [id number]  01/04/2022 Patient continues to have some difficulty and double vision.  Patient is stating that it does last 10 to 15 minutes and only happens when she goes from a flexed position and then activity extension.  Can be other but recently she says that it is lateralized to the left mostly.  Seems to be entire eye and not the visual field.  Patient did have a regular overall regular and the brain the patient does have a cyst in the pelvis but not will be consistent with the findings.  Robinson headache associated with it.  I do feel at this point an MR angiogram of the cervical neck would be beneficial to make sure there is Robinson stenotic lesion or any aneurysm or torturous blood vessel that could be contributing to this.  Patient has been taking some anti-inflammatories and encouraged her to monitor taking too much at the moment.  Patient noted significant worsening symptoms to seek medical attention immediately.  After imaging we will discuss further.  Total time reviewing patient's MRI, as well as reviewing the differential with patient 36 minutes     Update 04/06/2022 Abigail Robinson is a 73 y.o. female coming in with complaint of neck and L shoulder pain. Patient states that her shoulder pain is worse. Her pain is radiating down the shoulder into the bicep. Feels a burning sensation that sometimes travels down into her L hand. Patient said that her dog jerked her arm in November. Was put on prednisone over Christmas which helped but her pain came back. Patient is able to flex shoulder but the burning intensifies. Pain is keeping her awake at night.   Neck remains the same. C/o stiffness.        Past Medical  History:  Diagnosis Date   Allergy    Arthritis    back, hands   Basal cell carcinoma    face   Colon polyps    Complication of anesthesia    prolonged sedation, slow to wake up   Diplopia    Endometriosis    GERD (gastroesophageal reflux disease)    Headache    Migraines   Hyperlipidemia    Hypertension    Hypothyroidism    Skin cancer    hx of   Status post dilation of esophageal narrowing    Thalamic cyst    Thyroid disease    Vertigo    Past Surgical History:  Procedure Laterality Date   ANTERIOR AND POSTERIOR REPAIR WITH SACROSPINOUS FIXATION N/A 07/08/2015   Procedure: ANTERIOR AND POSTERIOR REPAIR ;  Surgeon: Louretta Shorten, MD;  Location: [facility];  Service: Gynecology;  Laterality: N/A;   COLONOSCOPY     DIAGNOSTIC LAPAROSCOPY     LAPAROSCOPIC VAGINAL HYSTERECTOMY WITH SALPINGO OOPHORECTOMY Bilateral 07/08/2015   Procedure: LAPAROSCOPIC ASSISTED VAGINAL HYSTERECTOMY WITH BILATERAL SALPINGO OOPHORECTOMY;  Surgeon: Louretta Shorten, MD;  Location: [facility];  Service: Gynecology;  Laterality: Bilateral;   OOPHORECTOMY  age 74   for endometrosis   Social History   Socioeconomic History   Marital status: Widowed    Spouse name: Not on file   Number of children: 0   Years of education: Not on file  Highest education level: Bachelor's degree (e.g., BA, AB, BS)  Occupational History    Comment: retired  Tobacco Use   Smoking status: Never   Smokeless tobacco: Never  Vaping Use   Vaping Use: Never used  Substance and Sexual Activity   Alcohol use: Not Currently   Drug use: Robinson   Sexual activity: Not on file  Other Topics Concern   Not on file  Social History Narrative   Lives alone   Social Determinants of Health   Financial Resource Strain: Not on file  Food Insecurity: Not on file  Transportation Needs: Not on file  Physical Activity: Not on file  Stress: Not on file  Social Connections: Not on file   Allergies  Allergen Reactions   Demerol [Meperidine] Nausea  Only and Other (See Comments)    Reaction=headache   Family History  Problem Relation Age of Onset   Liver cancer Mother    Kidney cancer Mother    Lung cancer Mother    Heart disease Father    Hypertension Father    Cirrhosis Father    Macular degeneration Father    Diabetes Brother    Heart attack Brother    Breast cancer Maternal Grandmother    Stroke Maternal Grandmother    Colon cancer Neg Hx    Esophageal cancer Neg Hx    Stomach cancer Neg Hx     Current Outpatient Medications (Endocrine & Metabolic):    levothyroxine (SYNTHROID, LEVOTHROID) 75 MCG tablet, Take 75 mcg by mouth at bedtime.    predniSONE (DELTASONE) 20 MG tablet, Take 1 tablet (20 mg total) by mouth daily with breakfast.  Current Outpatient Medications (Cardiovascular):    atenolol (TENORMIN) 50 MG tablet, Take 1 tablet by mouth Daily.   hydrochlorothiazide (MICROZIDE) 12.5 MG capsule, Take 12.5 mg by mouth daily.   losartan (COZAAR) 100 MG tablet, Take 100 mg by mouth daily.   Current Outpatient Medications (Analgesics):    acetaminophen (TYLENOL) 500 MG tablet, Take 1,000 mg by mouth 2 (two) times daily as needed for moderate pain or headache.    celecoxib (CELEBREX) 200 MG capsule, Take 200 mg by mouth daily.  Current Outpatient Medications (Hematological):    vitamin B-12 (CYANOCOBALAMIN) 1000 MCG tablet, Take 1,000 mcg by mouth daily.  Current Outpatient Medications (Other):    Calcium Carb-Cholecalciferol 500-600 MG-UNIT TABS, Take 1 tablet by mouth daily.   gabapentin (NEURONTIN) 100 MG capsule, Take 2 capsules (200 mg total) by mouth at bedtime.   meclizine (ANTIVERT) 25 MG tablet, Take 1 tablet (25 mg total) by mouth 3 (three) times daily as needed for dizziness.   omeprazole (PRILOSEC) 20 MG capsule, Take 20 mg by mouth daily as needed (for heartburn).    Probiotic Product (PROBIOTIC PO), Take 1 capsule by mouth daily. Ultimate 16 strain w/ trace minerals & sos   tiZANidine (ZANAFLEX) 2 MG  tablet, Take 1 tablet (2 mg total) by mouth at bedtime.   Vitamins A & D (VITAMIN A & D) 5000-400 UNITS CAPS, Take 1 capsule by mouth daily.   Reviewed prior external information including notes and imaging from  primary care provider As well as notes that were available from care everywhere and other healthcare systems.  Past medical history, social, surgical and family history all reviewed in electronic medical record.  Robinson pertanent information unless stated regarding to the chief complaint.   Review of Systems:  Robinson headache, visual changes, nausea, vomiting, diarrhea, constipation, dizziness, abdominal pain, skin rash, fevers,  chills, night sweats, weight loss, swollen lymph nodes, body aches, joint swelling, chest pain, shortness of breath, mood changes. POSITIVE muscle aches  Objective  Blood pressure 122/84, pulse 66, height '5\' 5"'$  (1.651 m), weight 187 lb (84.8 kg), SpO2 98 %.   General: Robinson apparent distress alert and oriented x3 mood and affect normal, dressed appropriately.  HEENT: Pupils equal, extraocular movements intact  Respiratory: Patient's speak in full sentences and does not appear short of breath  Cardiovascular: Robinson lower extremity edema, non tender, Robinson erythema  Left shoulder exam does have some voluntary guarding noted.  Positive impingement noted.  The patient's rotator cuff strength 3+ out of 5 compared to the contralateral side.  Patient does have a positive crossover test noted. Neck exam does have some loss of lordosis as well.  Limited muscular skeletal ultrasound was performed and interpreted by Hulan Saas, M  Limited ultrasound shows that patient does have what appears to be a partial thickness tear, likely low-grade of the supraspinatus.  Patient has an abnormality noted of the posterior labrum also noted.  Moderate glenohumeral and likely moderate acromioclavicular arthritis is also noted. Impression: Acute on chronic rotator cuff tear, partial low-grade of  the supraspinatus and underlying arthritis.    Impression and Recommendations:     The above documentation has been reviewed and is accurate and complete Lyndal Pulley, DO

## 2022-04-06 ENCOUNTER — Ambulatory Visit (INDEPENDENT_AMBULATORY_CARE_PROVIDER_SITE_OTHER): Payer: Medicare Other

## 2022-04-06 ENCOUNTER — Encounter: Payer: Self-pay | Admitting: Family Medicine

## 2022-04-06 ENCOUNTER — Ambulatory Visit: Payer: Self-pay

## 2022-04-06 ENCOUNTER — Ambulatory Visit (INDEPENDENT_AMBULATORY_CARE_PROVIDER_SITE_OTHER): Payer: Medicare Other | Admitting: Family Medicine

## 2022-04-06 VITALS — BP 122/84 | HR 66 | Ht 65.0 in | Wt 187.0 lb

## 2022-04-06 DIAGNOSIS — M25512 Pain in left shoulder: Secondary | ICD-10-CM

## 2022-04-06 DIAGNOSIS — M5030 Other cervical disc degeneration, unspecified cervical region: Secondary | ICD-10-CM

## 2022-04-06 DIAGNOSIS — M19012 Primary osteoarthritis, left shoulder: Secondary | ICD-10-CM | POA: Diagnosis not present

## 2022-04-06 MED ORDER — TIZANIDINE HCL 2 MG PO TABS: 2.0000 mg | ORAL_TABLET | Freq: Every day | ORAL | 0 refills | Status: DC

## 2022-04-06 MED ORDER — PREDNISONE 20 MG PO TABS: 20.0000 mg | ORAL_TABLET | Freq: Every day | ORAL | 0 refills | Status: DC

## 2022-04-06 NOTE — Assessment & Plan Note (Signed)
Known underlying degenerative disc disease. Concerned that some radicular symptoms may be occurring but difficult to assess on exam with patient also having some shoulder pathology.  We discussed different medications and patient wanted to try a muscle relaxer.  Zanaflex 2 mg given at night.  Warned of potential side effects.  We discussed with patient about icing regimen and home exercises patient will be sent to formal physical therapy as well.  See how patient responds to conservative therapy and follow-up in 6 to 8 weeks

## 2022-04-06 NOTE — Patient Instructions (Addendum)
PT [place] Zanaflex '2mg'$  Stop celebrex Pred '20mg'$  for 5 days Xray on way out See me in 6-8 weeks

## 2022-04-17 DIAGNOSIS — M25512 Pain in left shoulder: Secondary | ICD-10-CM | POA: Diagnosis not present

## 2022-04-19 DIAGNOSIS — M25512 Pain in left shoulder: Secondary | ICD-10-CM | POA: Diagnosis not present

## 2022-04-24 DIAGNOSIS — M25512 Pain in left shoulder: Secondary | ICD-10-CM | POA: Diagnosis not present

## 2022-04-26 DIAGNOSIS — M25512 Pain in left shoulder: Secondary | ICD-10-CM | POA: Diagnosis not present

## 2022-05-02 DIAGNOSIS — M25512 Pain in left shoulder: Secondary | ICD-10-CM | POA: Diagnosis not present

## 2022-05-04 DIAGNOSIS — M25512 Pain in left shoulder: Secondary | ICD-10-CM | POA: Diagnosis not present

## 2022-05-17 NOTE — Progress Notes (Signed)
[name] [address] [place] [address] Phone: [phone] Subjective:   [name], am serving as a scribe for Dr. Antoine Primas.  I'm seeing this patient by the request  of:  Melida Quitter, MD  CC: left shoulder pain   [id number]  04/06/2022 Known underlying degenerative disc disease. Concerned that some radicular symptoms may be occurring but difficult to assess on exam with patient also having some shoulder pathology.  We discussed different medications and patient wanted to try a muscle relaxer.  Zanaflex 2 mg given at night.  Warned of potential side effects.  We discussed with patient about icing regimen and home exercises patient will be sent to formal physical therapy as well.  See how patient responds to conservative therapy and follow-up in 6 to 8 weeks      Update 05/18/2022 Abigail Robinson is a 72 y.o. female coming in with complaint of L shoulder and cervical spine pain. Patient states feels that PT made her pain worse. Pain will radiate to thumb sometimes. Takes 2 tylenol which usual helps, but doesn't even touch the pain. Pain does wake her at night and keeps her from sleeping comfortably. Feels a clicking with ROM      Past Medical History:  Diagnosis Date   Allergy    Arthritis    back, hands   Basal cell carcinoma    face   Colon polyps    Complication of anesthesia    prolonged sedation, slow to wake up   Diplopia    Endometriosis    GERD (gastroesophageal reflux disease)    Headache    Migraines   Hyperlipidemia    Hypertension    Hypothyroidism    Skin cancer    hx of   Status post dilation of esophageal narrowing    Thalamic cyst    Thyroid disease    Vertigo    Past Surgical History:  Procedure Laterality Date   ANTERIOR AND POSTERIOR REPAIR WITH SACROSPINOUS FIXATION N/A 07/08/2015   Procedure: ANTERIOR AND POSTERIOR REPAIR ;  Surgeon: Candice Camp, MD;  Location: [facility];  Service: Gynecology;   Laterality: N/A;   COLONOSCOPY     DIAGNOSTIC LAPAROSCOPY     LAPAROSCOPIC VAGINAL HYSTERECTOMY WITH SALPINGO OOPHORECTOMY Bilateral 07/08/2015   Procedure: LAPAROSCOPIC ASSISTED VAGINAL HYSTERECTOMY WITH BILATERAL SALPINGO OOPHORECTOMY;  Surgeon: Candice Camp, MD;  Location: [facility];  Service: Gynecology;  Laterality: Bilateral;   OOPHORECTOMY  age 25   for endometrosis   Social History   Socioeconomic History   Marital status: Widowed    Spouse name: Not on file   Number of children: 0   Years of education: Not on file   Highest education level: Bachelor's degree (e.g., BA, AB, BS)  Occupational History    Comment: retired  Tobacco Use   Smoking status: Never   Smokeless tobacco: Never  Vaping Use   Vaping Use: Never used  Substance and Sexual Activity   Alcohol use: Not Currently   Drug use: No   Sexual activity: Not on file  Other Topics Concern   Not on file  Social History Narrative   Lives alone   Social Determinants of Health   Financial Resource Strain: Not on file  Food Insecurity: Not on file  Transportation Needs: Not on file  Physical Activity: Not on file  Stress: Not on file  Social Connections: Not on file   Allergies  Allergen Reactions   Demerol [Meperidine]  Nausea Only and Other (See Comments)    Reaction=headache   Family History  Problem Relation Age of Onset   Liver cancer Mother    Kidney cancer Mother    Lung cancer Mother    Heart disease Father    Hypertension Father    Cirrhosis Father    Macular degeneration Father    Diabetes Brother    Heart attack Brother    Breast cancer Maternal Grandmother    Stroke Maternal Grandmother    Colon cancer Neg Hx    Esophageal cancer Neg Hx    Stomach cancer Neg Hx     Current Outpatient Medications (Endocrine & Metabolic):    levothyroxine (SYNTHROID, LEVOTHROID) 75 MCG tablet, Take 75 mcg by mouth at bedtime.    predniSONE (DELTASONE) 20 MG tablet, Take 1 tablet (20 mg total) by mouth daily  with breakfast.  Current Outpatient Medications (Cardiovascular):    atenolol (TENORMIN) 50 MG tablet, Take 1 tablet by mouth Daily.   hydrochlorothiazide (MICROZIDE) 12.5 MG capsule, Take 12.5 mg by mouth daily.   losartan (COZAAR) 100 MG tablet, Take 100 mg by mouth daily.   Current Outpatient Medications (Analgesics):    acetaminophen (TYLENOL) 500 MG tablet, Take 1,000 mg by mouth 2 (two) times daily as needed for moderate pain or headache.    celecoxib (CELEBREX) 200 MG capsule, Take 200 mg by mouth daily.  Current Outpatient Medications (Hematological):    vitamin B-12 (CYANOCOBALAMIN) 1000 MCG tablet, Take 1,000 mcg by mouth daily.  Current Outpatient Medications (Other):    Calcium Carb-Cholecalciferol 500-600 MG-UNIT TABS, Take 1 tablet by mouth daily.   gabapentin (NEURONTIN) 100 MG capsule, Take 2 capsules (200 mg total) by mouth at bedtime.   meclizine (ANTIVERT) 25 MG tablet, Take 1 tablet (25 mg total) by mouth 3 (three) times daily as needed for dizziness.   omeprazole (PRILOSEC) 20 MG capsule, Take 20 mg by mouth daily as needed (for heartburn).    Probiotic Product (PROBIOTIC PO), Take 1 capsule by mouth daily. Ultimate 16 strain w/ trace minerals & sos   tiZANidine (ZANAFLEX) 2 MG tablet, Take 1 tablet (2 mg total) by mouth at bedtime.   Vitamins A & D (VITAMIN A & D) 5000-400 UNITS CAPS, Take 1 capsule by mouth daily.   Reviewed prior external information including notes and imaging from  primary care provider As well as notes that were available from care everywhere and other healthcare systems.  Past medical history, social, surgical and family history all reviewed in electronic medical record.  No pertanent information unless stated regarding to the chief complaint.   Review of Systems:  No headache, visual changes, nausea, vomiting, diarrhea, constipation, dizziness, abdominal pain, skin rash, fevers, chills, night sweats, weight loss, swollen lymph nodes, body  aches, joint swelling, chest pain, shortness of breath, mood changes. POSITIVE muscle aches  Objective  Blood pressure 110/80, height 5\' 5"  (1.651 m), weight 186 lb (84.4 kg), SpO2 96 %.   General: No apparent distress alert and oriented x3 mood and affect normal, dressed appropriately.  HEENT: Pupils equal, extraocular movements intact  Respiratory: Patient's speak in full sentences and does not appear short of breath  Cardiovascular: No lower extremity edema, non tender, no erythema  Left shoulder exam shows the patient does have some tightness of the trapezius.  Patient does have some mild crepitus with range of motion.  Rotator cuff does show weakness with 4 out of 5 strength.  Positive crossover noted.   Procedure:  Real-time Ultrasound Guided Injection of left glenohumeral joint Device: GE Logiq E  Ultrasound guided injection is preferred based studies that show increased duration, increased effect, greater accuracy, decreased procedural pain, increased response rate with ultrasound guided versus blind injection.  Verbal informed consent obtained.  Time-out conducted.  Noted no overlying erythema, induration, or other signs of local infection.  Skin prepped in a sterile fashion.  Local anesthesia: Topical Ethyl chloride.  With sterile technique and under real time ultrasound guidance:  Joint visualized.  21g 2 inch needle inserted posterior approach. Pictures taken for needle placement. Patient did have injection of 2 cc of 0.5% Marcaine, and 1cc of Kenalog 40 mg/dL. Completed without difficulty  Pain immediately resolved suggesting accurate placement of the medication.  Advised to call if fevers/chills, erythema, induration, drainage, or persistent bleeding.  Impression: Technically successful ultrasound guided injection.  Procedure: Real-time Ultrasound Guided Injection of left AC joint Device: GE Logiq Q7 Ultrasound guided injection is preferred based studies that show increased  duration, increased effect, greater accuracy, decreased procedural pain, increased response rate, and decreased cost with ultrasound guided versus blind injection.  Verbal informed consent obtained.  Time-out conducted.  Noted no overlying erythema, induration, or other signs of local infection.  Skin prepped in a sterile fashion.  Local anesthesia: Topical Ethyl chloride.  With sterile technique and under real time ultrasound guidance: With a 25-gauge half inch needle injected with 0.5 cc of 0.5% Marcaine and 0.5 cc of Kenalog 40 mg/mL Completed without difficulty  Pain immediately resolved suggesting accurate placement of the medication.  Advised to call if fevers/chills, erythema, induration, drainage, or persistent bleeding.  Impression: Technically successful ultrasound guided injection.    Impression and Recommendations:     The above documentation has been reviewed and is accurate and complete Judi Saa, DO

## 2022-05-18 ENCOUNTER — Ambulatory Visit: Payer: Self-pay

## 2022-05-18 ENCOUNTER — Ambulatory Visit (INDEPENDENT_AMBULATORY_CARE_PROVIDER_SITE_OTHER): Payer: Medicare Other | Admitting: Family Medicine

## 2022-05-18 VITALS — BP 110/80 | Ht 65.0 in | Wt 186.0 lb

## 2022-05-18 DIAGNOSIS — M19019 Primary osteoarthritis, unspecified shoulder: Secondary | ICD-10-CM | POA: Insufficient documentation

## 2022-05-18 DIAGNOSIS — M25512 Pain in left shoulder: Secondary | ICD-10-CM | POA: Diagnosis not present

## 2022-05-18 DIAGNOSIS — M12812 Other specific arthropathies, not elsewhere classified, left shoulder: Secondary | ICD-10-CM

## 2022-05-18 DIAGNOSIS — M19012 Primary osteoarthritis, left shoulder: Secondary | ICD-10-CM | POA: Diagnosis not present

## 2022-05-18 NOTE — Patient Instructions (Addendum)
Injection in shoulder today Omega 3's more than Omega 6's Tumeric daily Message me in 3-4 weeks if not better See you again in 10 weeks

## 2022-05-18 NOTE — Assessment & Plan Note (Signed)
Patient had signs and symptoms consistent with more shoulder pathology than cervical radiculopathy today.  Attempting this to see how much this is potentially contributing to the discomfort and pain.  If patient makes significant improvement then we will focus more on the shoulder.  Discussed icing regimen and home exercises, which activities to do and which ones to avoid.  We discussed with patient about advanced imaging being potentially worthwhile as well if this does not seem to make a significant difference in increasing in weakness.

## 2022-05-18 NOTE — Assessment & Plan Note (Signed)
Patient given injection as well.  This seems to be more of the arthritic changes giving more difficulty.  Discussed icing regimen and home exercises.  Differential includes cervical radiculopathy and may need to consider the possibility of more aggressive therapy for the neck.  Increase activity slowly.  Follow-up again in 6 to 8 weeks

## 2022-06-03 ENCOUNTER — Encounter: Payer: Self-pay | Admitting: Family Medicine

## 2022-06-05 ENCOUNTER — Other Ambulatory Visit: Payer: Self-pay

## 2022-06-05 DIAGNOSIS — G8929 Other chronic pain: Secondary | ICD-10-CM

## 2022-06-12 ENCOUNTER — Other Ambulatory Visit: Payer: Self-pay

## 2022-06-12 MED ORDER — DULOXETINE HCL 20 MG PO CPEP: 20.0000 mg | ORAL_CAPSULE | Freq: Every day | ORAL | 0 refills | Status: DC

## 2022-06-27 ENCOUNTER — Ambulatory Visit
Admission: RE | Admit: 2022-06-27 | Discharge: 2022-06-27 | Disposition: A | Discharge status: Home / Self Care | Payer: Medicare Other | Source: Ambulatory Visit | Attending: Family Medicine | Admitting: Family Medicine

## 2022-06-27 DIAGNOSIS — M75112 Incomplete rotator cuff tear or rupture of left shoulder, not specified as traumatic: Secondary | ICD-10-CM | POA: Diagnosis not present

## 2022-06-27 DIAGNOSIS — G8929 Other chronic pain: Secondary | ICD-10-CM

## 2022-06-27 DIAGNOSIS — M19012 Primary osteoarthritis, left shoulder: Secondary | ICD-10-CM | POA: Diagnosis not present

## 2022-06-30 DIAGNOSIS — H00019 Hordeolum externum unspecified eye, unspecified eyelid: Secondary | ICD-10-CM | POA: Diagnosis not present

## 2022-07-03 ENCOUNTER — Encounter: Payer: Self-pay | Admitting: Family Medicine

## 2022-07-03 ENCOUNTER — Ambulatory Visit (INDEPENDENT_AMBULATORY_CARE_PROVIDER_SITE_OTHER): Payer: Self-pay | Admitting: Family Medicine

## 2022-07-03 ENCOUNTER — Other Ambulatory Visit: Payer: Self-pay

## 2022-07-03 VITALS — BP 118/84 | HR 86 | Ht 65.0 in | Wt 183.0 lb

## 2022-07-03 DIAGNOSIS — H04123 Dry eye syndrome of bilateral lacrimal glands: Secondary | ICD-10-CM | POA: Diagnosis not present

## 2022-07-03 DIAGNOSIS — M25512 Pain in left shoulder: Secondary | ICD-10-CM

## 2022-07-03 DIAGNOSIS — H00016 Hordeolum externum left eye, unspecified eyelid: Secondary | ICD-10-CM | POA: Diagnosis not present

## 2022-07-03 DIAGNOSIS — H26493 Other secondary cataract, bilateral: Secondary | ICD-10-CM | POA: Diagnosis not present

## 2022-07-03 DIAGNOSIS — H5202 Hypermetropia, left eye: Secondary | ICD-10-CM | POA: Diagnosis not present

## 2022-07-03 DIAGNOSIS — M12812 Other specific arthropathies, not elsewhere classified, left shoulder: Secondary | ICD-10-CM

## 2022-07-03 NOTE — Patient Instructions (Signed)
No ice or IBU for 3 days Heat and Tylenol are ok See me again in 6 weeks 

## 2022-07-03 NOTE — Progress Notes (Signed)
[name] [address] [place] [address] Phone: [phone] Subjective:   Bruce Donath, am serving as a scribe for Dr. Antoine Primas.  I'm seeing this patient by the request  of:  Melida Quitter, MD  CC: Left shoulder pain follow-up  [id number]  Abigail Robinson is a 72 y.o. female coming in with complaint of L shoulder pain. Patient states that she is doing a lot better than last visit. Unsure if it is because of Cymbalta or vitamins. ER still is painful but not pattern to pain otherwise.       Past Medical History:  Diagnosis Date   Allergy    Arthritis    back, hands   Basal cell carcinoma    face   Colon polyps    Complication of anesthesia    prolonged sedation, slow to wake up   Diplopia    Endometriosis    GERD (gastroesophageal reflux disease)    Headache    Migraines   Hyperlipidemia    Hypertension    Hypothyroidism    Skin cancer    hx of   Status post dilation of esophageal narrowing    Thalamic cyst    Thyroid disease    Vertigo    Past Surgical History:  Procedure Laterality Date   ANTERIOR AND POSTERIOR REPAIR WITH SACROSPINOUS FIXATION N/A 07/08/2015   Procedure: ANTERIOR AND POSTERIOR REPAIR ;  Surgeon: Candice Camp, MD;  Location: [facility];  Service: Gynecology;  Laterality: N/A;   COLONOSCOPY     DIAGNOSTIC LAPAROSCOPY     LAPAROSCOPIC VAGINAL HYSTERECTOMY WITH SALPINGO OOPHORECTOMY Bilateral 07/08/2015   Procedure: LAPAROSCOPIC ASSISTED VAGINAL HYSTERECTOMY WITH BILATERAL SALPINGO OOPHORECTOMY;  Surgeon: Candice Camp, MD;  Location: [facility];  Service: Gynecology;  Laterality: Bilateral;   OOPHORECTOMY  age 70   for endometrosis   Social History   Socioeconomic History   Marital status: Widowed    Spouse name: Not on file   Number of children: 0   Years of education: Not on file   Highest education level: Bachelor's degree (e.g., BA, AB, BS)  Occupational History    Comment: retired  Tobacco Use    Smoking status: Never   Smokeless tobacco: Never  Vaping Use   Vaping Use: Never used  Substance and Sexual Activity   Alcohol use: Not Currently   Drug use: No   Sexual activity: Not on file  Other Topics Concern   Not on file  Social History Narrative   Lives alone   Social Determinants of Health   Financial Resource Strain: Not on file  Food Insecurity: Not on file  Transportation Needs: Not on file  Physical Activity: Not on file  Stress: Not on file  Social Connections: Not on file   Allergies  Allergen Reactions   Demerol [Meperidine] Nausea Only and Other (See Comments)    Reaction=headache   Family History  Problem Relation Age of Onset   Liver cancer Mother    Kidney cancer Mother    Lung cancer Mother    Heart disease Father    Hypertension Father    Cirrhosis Father    Macular degeneration Father    Diabetes Brother    Heart attack Brother    Breast cancer Maternal Grandmother    Stroke Maternal Grandmother    Colon cancer Neg Hx    Esophageal cancer Neg Hx    Stomach cancer Neg Hx     Current Outpatient Medications (  Endocrine & Metabolic):    levothyroxine (SYNTHROID, LEVOTHROID) 75 MCG tablet, Take 75 mcg by mouth at bedtime.    predniSONE (DELTASONE) 20 MG tablet, Take 1 tablet (20 mg total) by mouth daily with breakfast.  Current Outpatient Medications (Cardiovascular):    atenolol (TENORMIN) 50 MG tablet, Take 1 tablet by mouth Daily.   hydrochlorothiazide (MICROZIDE) 12.5 MG capsule, Take 12.5 mg by mouth daily.   losartan (COZAAR) 100 MG tablet, Take 100 mg by mouth daily.   Current Outpatient Medications (Analgesics):    acetaminophen (TYLENOL) 500 MG tablet, Take 1,000 mg by mouth 2 (two) times daily as needed for moderate pain or headache.    celecoxib (CELEBREX) 200 MG capsule, Take 200 mg by mouth daily.  Current Outpatient Medications (Hematological):    vitamin B-12 (CYANOCOBALAMIN) 1000 MCG tablet, Take 1,000 mcg by mouth  daily.  Current Outpatient Medications (Other):    Calcium Carb-Cholecalciferol 500-600 MG-UNIT TABS, Take 1 tablet by mouth daily.   DULoxetine (CYMBALTA) 20 MG capsule, Take 1 capsule (20 mg total) by mouth daily.   gabapentin (NEURONTIN) 100 MG capsule, Take 2 capsules (200 mg total) by mouth at bedtime.   meclizine (ANTIVERT) 25 MG tablet, Take 1 tablet (25 mg total) by mouth 3 (three) times daily as needed for dizziness.   omeprazole (PRILOSEC) 20 MG capsule, Take 20 mg by mouth daily as needed (for heartburn).    Probiotic Product (PROBIOTIC PO), Take 1 capsule by mouth daily. Ultimate 16 strain w/ trace minerals & sos   tiZANidine (ZANAFLEX) 2 MG tablet, Take 1 tablet (2 mg total) by mouth at bedtime.   Vitamins A & D (VITAMIN A & D) 5000-400 UNITS CAPS, Take 1 capsule by mouth daily.     Objective  Blood pressure 118/84, pulse 86, height 5\' 5"  (1.651 m), weight 183 lb (83 kg), SpO2 97 %.   General: No apparent distress alert and oriented x3 mood and affect normal, dressed appropriately.   Procedure: Real-time Ultrasound Guided Injection of left supraspinatus tendon sheath corresponding with the high-grade partial-thickness bursal surface tear of the posterior tendon Device: GE Logiq E  Ultrasound guided injection is preferred based studies that show increased duration, increased effect, greater accuracy, decreased procedural pain, increased response rate with ultrasound guided versus blind injection.  Verbal informed consent obtained.  Time-out conducted.  Noted no overlying erythema, induration, or other signs of local infection.  Skin prepped in a sterile fashion.  Local anesthesia: Topical Ethyl chloride.  With sterile technique and under real time ultrasound guidance:  Joint visualized.  21g 2 inch needle inserted lateral approach.  Patient was injected with 0.5 cc of 0.5% Marcaine then injected with 4 cc of PRP Completed without difficulty  Pain immediately resolved  suggesting accurate placement of the medication.  Advised to call if fevers/chills, erythema, induration, drainage, or persistent bleeding.  Impression: Technically successful ultrasound guided injection.    Impression and Recommendations:     The above documentation has been reviewed and is accurate and complete Judi Saa, DO

## 2022-07-03 NOTE — Assessment & Plan Note (Signed)
PRP injection given today, post PRP instructions given by athletic trainer today.  Patient will avoid ice and ibuprofen for the next 72 hours.  Follow-up with me again in 6 weeks to see how she is responding

## 2022-07-11 ENCOUNTER — Other Ambulatory Visit: Payer: Self-pay | Admitting: Family Medicine

## 2022-07-11 MED ORDER — DULOXETINE HCL 20 MG PO CPEP: 20.0000 mg | ORAL_CAPSULE | Freq: Every day | ORAL | 0 refills | Status: DC

## 2022-07-11 NOTE — Telephone Encounter (Signed)
Last OV (Dr. Katrinka Blazing) 07/03/22 Next OV 08/15/22  Last refill: 06/12/22 #30/0

## 2022-07-25 DIAGNOSIS — R1011 Right upper quadrant pain: Secondary | ICD-10-CM | POA: Diagnosis not present

## 2022-07-25 DIAGNOSIS — K219 Gastro-esophageal reflux disease without esophagitis: Secondary | ICD-10-CM | POA: Diagnosis not present

## 2022-07-26 ENCOUNTER — Other Ambulatory Visit: Payer: Self-pay | Admitting: Family Medicine

## 2022-07-26 DIAGNOSIS — R1011 Right upper quadrant pain: Secondary | ICD-10-CM

## 2022-07-27 ENCOUNTER — Ambulatory Visit: Payer: Medicare Other | Admitting: Family Medicine

## 2022-08-01 ENCOUNTER — Ambulatory Visit
Admission: RE | Admit: 2022-08-01 | Discharge: 2022-08-01 | Disposition: A | Discharge status: Home / Self Care | Payer: Medicare Other | Source: Ambulatory Visit | Attending: Family Medicine | Admitting: Family Medicine

## 2022-08-01 DIAGNOSIS — R1011 Right upper quadrant pain: Secondary | ICD-10-CM | POA: Diagnosis not present

## 2022-08-02 ENCOUNTER — Other Ambulatory Visit: Payer: Self-pay | Admitting: Internal Medicine

## 2022-08-02 DIAGNOSIS — K8020 Calculus of gallbladder without cholecystitis without obstruction: Secondary | ICD-10-CM

## 2022-08-08 DIAGNOSIS — H0016 Chalazion left eye, unspecified eyelid: Secondary | ICD-10-CM | POA: Diagnosis not present

## 2022-08-08 DIAGNOSIS — H04123 Dry eye syndrome of bilateral lacrimal glands: Secondary | ICD-10-CM | POA: Diagnosis not present

## 2022-08-08 DIAGNOSIS — H524 Presbyopia: Secondary | ICD-10-CM | POA: Diagnosis not present

## 2022-08-08 DIAGNOSIS — H52223 Regular astigmatism, bilateral: Secondary | ICD-10-CM | POA: Diagnosis not present

## 2022-08-08 DIAGNOSIS — H5211 Myopia, right eye: Secondary | ICD-10-CM | POA: Diagnosis not present

## 2022-08-08 DIAGNOSIS — H5202 Hypermetropia, left eye: Secondary | ICD-10-CM | POA: Diagnosis not present

## 2022-08-10 NOTE — Progress Notes (Signed)
[name] [address] [place] [address] Phone: [phone] Subjective:   Bruce Donath, am serving as a scribe for Dr. Antoine Primas.  I'm seeing this patient by the request  of:  Melida Quitter, MD  CC: left shoulder pain   BJY:[id number]  07/03/2022 PRP injection given today, post PRP instructions given by athletic trainer today.  Patient will avoid ice and ibuprofen for the next 72 hours.  Follow-up with me again in 6 weeks to see how she is responding      Update 08/15/2022 Abigail Robinson is a 72 y.o. female coming in with complaint of L shoulder pain. Patient states that she has not been thinking about her shoulder pain. She has had a sty in her eye and had an MRI that shows a mass. If she uses tylenol her pain is much less.   Requesting cymbalta     Past Medical History:  Diagnosis Date   Allergy    Arthritis    back, hands   Basal cell carcinoma    face   Colon polyps    Complication of anesthesia    prolonged sedation, slow to wake up   Diplopia    Endometriosis    GERD (gastroesophageal reflux disease)    Headache    Migraines   Hyperlipidemia    Hypertension    Hypothyroidism    Skin cancer    hx of   Status post dilation of esophageal narrowing    Thalamic cyst    Thyroid disease    Vertigo    Past Surgical History:  Procedure Laterality Date   ANTERIOR AND POSTERIOR REPAIR WITH SACROSPINOUS FIXATION N/A 07/08/2015   Procedure: ANTERIOR AND POSTERIOR REPAIR ;  Surgeon: Candice Camp, MD;  Location: [facility];  Service: Gynecology;  Laterality: N/A;   COLONOSCOPY     DIAGNOSTIC LAPAROSCOPY     LAPAROSCOPIC VAGINAL HYSTERECTOMY WITH SALPINGO OOPHORECTOMY Bilateral 07/08/2015   Procedure: LAPAROSCOPIC ASSISTED VAGINAL HYSTERECTOMY WITH BILATERAL SALPINGO OOPHORECTOMY;  Surgeon: Candice Camp, MD;  Location: [facility];  Service: Gynecology;  Laterality: Bilateral;   OOPHORECTOMY  age 56   for endometrosis   Social History    Socioeconomic History   Marital status: Widowed    Spouse name: Not on file   Number of children: 0   Years of education: Not on file   Highest education level: Bachelor's degree (e.g., BA, AB, BS)  Occupational History    Comment: retired  Tobacco Use   Smoking status: Never   Smokeless tobacco: Never  Vaping Use   Vaping Use: Never used  Substance and Sexual Activity   Alcohol use: Not Currently   Drug use: No   Sexual activity: Not on file  Other Topics Concern   Not on file  Social History Narrative   Lives alone   Social Determinants of Health   Financial Resource Strain: Not on file  Food Insecurity: Not on file  Transportation Needs: Not on file  Physical Activity: Not on file  Stress: Not on file  Social Connections: Not on file   Allergies  Allergen Reactions   Demerol [Meperidine] Nausea Only and Other (See Comments)    Reaction=headache   Family History  Problem Relation Age of Onset   Liver cancer Mother    Kidney cancer Mother    Lung cancer Mother    Heart disease Father    Hypertension Father    Cirrhosis Father    Macular  degeneration Father    Diabetes Brother    Heart attack Brother    Breast cancer Maternal Grandmother    Stroke Maternal Grandmother    Colon cancer Neg Hx    Esophageal cancer Neg Hx    Stomach cancer Neg Hx     Current Outpatient Medications (Endocrine & Metabolic):    levothyroxine (SYNTHROID, LEVOTHROID) 75 MCG tablet, Take 75 mcg by mouth at bedtime.    predniSONE (DELTASONE) 20 MG tablet, Take 1 tablet (20 mg total) by mouth daily with breakfast.  Current Outpatient Medications (Cardiovascular):    atenolol (TENORMIN) 50 MG tablet, Take 1 tablet by mouth Daily.   hydrochlorothiazide (MICROZIDE) 12.5 MG capsule, Take 12.5 mg by mouth daily.   losartan (COZAAR) 100 MG tablet, Take 100 mg by mouth daily.   Current Outpatient Medications (Analgesics):    acetaminophen (TYLENOL) 500 MG tablet, Take 1,000 mg by  mouth 2 (two) times daily as needed for moderate pain or headache.    celecoxib (CELEBREX) 200 MG capsule, Take 200 mg by mouth daily.  Current Outpatient Medications (Hematological):    vitamin B-12 (CYANOCOBALAMIN) 1000 MCG tablet, Take 1,000 mcg by mouth daily.  Current Outpatient Medications (Other):    Calcium Carb-Cholecalciferol 500-600 MG-UNIT TABS, Take 1 tablet by mouth daily.   DULoxetine (CYMBALTA) 20 MG capsule, Take 1 capsule (20 mg total) by mouth daily.   gabapentin (NEURONTIN) 100 MG capsule, Take 2 capsules (200 mg total) by mouth at bedtime.   meclizine (ANTIVERT) 25 MG tablet, Take 1 tablet (25 mg total) by mouth 3 (three) times daily as needed for dizziness.   omeprazole (PRILOSEC) 20 MG capsule, Take 20 mg by mouth daily as needed (for heartburn).    Probiotic Product (PROBIOTIC PO), Take 1 capsule by mouth daily. Ultimate 16 strain w/ trace minerals & sos   tiZANidine (ZANAFLEX) 2 MG tablet, Take 1 tablet (2 mg total) by mouth at bedtime.   Vitamins A & D (VITAMIN A & D) 5000-400 UNITS CAPS, Take 1 capsule by mouth daily.   Reviewed prior external information including notes and imaging from  primary care provider As well as notes that were available from care everywhere and other healthcare systems.  Past medical history, social, surgical and family history all reviewed in electronic medical record.  No pertanent information unless stated regarding to the chief complaint.   Review of Systems:  No headache, visual changes, nausea, vomiting, diarrhea, constipation, dizziness, abdominal pain, skin rash, fevers, chills, night sweats, weight loss, swollen lymph nodes, body aches, joint swelling, chest pain, shortness of breath, mood changes. POSITIVE muscle aches  Objective  Blood pressure 122/88, pulse 92, height 5\' 5"  (1.651 m), weight 179 lb (81.2 kg), SpO2 98 %.   General: No apparent distress alert and oriented x3 mood and affect normal, dressed appropriately.   HEENT: Pupils equal, extraocular movements intact  Respiratory: Patient's speak in full sentences and does not appear short of breath  Cardiovascular: No lower extremity edema, non tender, no erythema  Left shoulder exam shows patient does have some very mild crepitus noted.  Rotator cuff strength 4 out of 5.  Patient does have some limited internal range of motion noted.  Limited muscular skeletal ultrasound was performed and interpreted by Antoine Primas, M  Limited ultrasound of patient's left shoulder shows that there is still some narrowing of the glenohumeral joint noted.  Does have narrowing of the acromioclavicular joint with hypoechoic changes.  Rotator cuff noted does appear to be  healthier with less hypoechoic changes than the previous exam. Impression: Mild interval improvement of the rotator cuff arthropathy.   Impression and Recommendations:    The above documentation has been reviewed and is accurate and complete Judi Saa, DO

## 2022-08-11 ENCOUNTER — Ambulatory Visit
Admission: RE | Admit: 2022-08-11 | Discharge: 2022-08-11 | Disposition: A | Discharge status: Home / Self Care | Payer: Medicare Other | Source: Ambulatory Visit | Attending: Internal Medicine | Admitting: Internal Medicine

## 2022-08-11 DIAGNOSIS — R197 Diarrhea, unspecified: Secondary | ICD-10-CM | POA: Diagnosis not present

## 2022-08-11 DIAGNOSIS — K8020 Calculus of gallbladder without cholecystitis without obstruction: Secondary | ICD-10-CM

## 2022-08-11 DIAGNOSIS — R109 Unspecified abdominal pain: Secondary | ICD-10-CM | POA: Diagnosis not present

## 2022-08-11 DIAGNOSIS — R110 Nausea: Secondary | ICD-10-CM | POA: Diagnosis not present

## 2022-08-11 DIAGNOSIS — K829 Disease of gallbladder, unspecified: Secondary | ICD-10-CM | POA: Diagnosis not present

## 2022-08-11 MED ORDER — GADOPICLENOL 0.5 MMOL/ML IV SOLN
9.0000 mL | Freq: Once | INTRAVENOUS | Status: AC | PRN
  Administered 2022-08-11: 7.5 mL via INTRAVENOUS

## 2022-08-15 ENCOUNTER — Encounter: Payer: Self-pay | Admitting: Family Medicine

## 2022-08-15 ENCOUNTER — Ambulatory Visit: Payer: Self-pay

## 2022-08-15 ENCOUNTER — Ambulatory Visit (INDEPENDENT_AMBULATORY_CARE_PROVIDER_SITE_OTHER): Payer: Medicare Other | Admitting: Family Medicine

## 2022-08-15 VITALS — BP 122/88 | HR 92 | Ht 65.0 in | Wt 179.0 lb

## 2022-08-15 DIAGNOSIS — M25512 Pain in left shoulder: Secondary | ICD-10-CM | POA: Diagnosis not present

## 2022-08-15 DIAGNOSIS — M5136 Other intervertebral disc degeneration, lumbar region: Secondary | ICD-10-CM

## 2022-08-15 DIAGNOSIS — M12812 Other specific arthropathies, not elsewhere classified, left shoulder: Secondary | ICD-10-CM

## 2022-08-15 DIAGNOSIS — G8929 Other chronic pain: Secondary | ICD-10-CM

## 2022-08-15 DIAGNOSIS — M19012 Primary osteoarthritis, left shoulder: Secondary | ICD-10-CM | POA: Diagnosis not present

## 2022-08-15 MED ORDER — DULOXETINE HCL 20 MG PO CPEP: 20.0000 mg | ORAL_CAPSULE | Freq: Every day | ORAL | 0 refills | Status: DC

## 2022-08-15 NOTE — Patient Instructions (Signed)
Good to see you  Healing up See me in 3 months

## 2022-08-15 NOTE — Assessment & Plan Note (Signed)
Patient has made some progress at this time.  Still has the underlying arthritic changes but seems to be doing relatively well.  Rotator cuff does seem to be more healthy than previous ultrasound.  We discussed with patient different treatment options and can repeat injections if necessary.  Follow-up with me again in 3 months

## 2022-08-15 NOTE — Assessment & Plan Note (Signed)
Patient did have still some hypoechoic changes noted of the acromioclavicular joint.  Discussed with patient that we could consider the possibility of injection.  Patient wanted to hold because felt like there was a chance that it did cause some stomach upset.  Patient is going to follow-up with me again in 3 months

## 2022-08-15 NOTE — Assessment & Plan Note (Signed)
Refilled Cymbalta at this time.  Can consider the Celebrex again as well.  Does need to monitor patient's liver functions which have been okay but is awaiting the over read of the MRI of the abdomen.

## 2022-08-16 ENCOUNTER — Telehealth: Payer: Self-pay | Admitting: Gastroenterology

## 2022-08-16 ENCOUNTER — Encounter: Payer: Self-pay | Admitting: Family Medicine

## 2022-08-16 DIAGNOSIS — K828 Other specified diseases of gallbladder: Secondary | ICD-10-CM

## 2022-08-16 DIAGNOSIS — K7689 Other specified diseases of liver: Secondary | ICD-10-CM

## 2022-08-16 DIAGNOSIS — K862 Cyst of pancreas: Secondary | ICD-10-CM

## 2022-08-16 NOTE — Telephone Encounter (Signed)
Dr. Meridee Score,   Please see urgent referral from Dr. Dorinda Hill.   for possible MRCP. Gallbladder and duct thickening, cystic foci on pancreas and liver   Referral placed on your desk for review.  Thank you, Judeth Cornfield

## 2022-08-17 NOTE — Telephone Encounter (Signed)
Left message on machine to call back  

## 2022-08-17 NOTE — Telephone Encounter (Signed)
PT has an allergy to gadolinium and needs it to say that on the MRCP. Please advise.

## 2022-08-17 NOTE — Addendum Note (Signed)
Addended by: Loretha Stapler on: 08/17/2022 09:52 AM   Modules accepted: Orders

## 2022-08-17 NOTE — Telephone Encounter (Signed)
The pt has been advised that the order has been entered and sent to the schedulers. She will call if she has not heard from them in 1 week.

## 2022-08-17 NOTE — Telephone Encounter (Signed)
Thanks for forwarding this. This is a patient of Dr. Christella Hartigan. I have reviewed the imaging. I would recommend this patient have a dedicated MRICP for [place] prior to considering endoscopic ultrasound. You may go ahead and order this under my name since she is already an established patient of Christella Hartigan. Thanks. GM

## 2022-08-21 ENCOUNTER — Other Ambulatory Visit: Payer: Self-pay

## 2022-08-21 MED ORDER — PREDNISONE 50 MG PO TABS: ORAL_TABLET | ORAL | 0 refills | Status: DC

## 2022-08-21 NOTE — Telephone Encounter (Signed)
Inbound call from patient stating she has not received prednisone medication from [facility] pharmacy. Please advise, thank you.

## 2022-08-21 NOTE — Telephone Encounter (Signed)
Dr Meridee Score I went ahead and sent the prednisone to the pharmacy as well as instructions for benadryl/prednisone protocol per policy  The pt returned call and has been advised

## 2022-08-21 NOTE — Telephone Encounter (Signed)
Thanks for update. Agree with pre-imaging protocol. GM

## 2022-08-21 NOTE — Telephone Encounter (Signed)
Left message on machine to call back   Dr Meridee Score I have attempted to reach the pt in regards to her concerns with allergy and upcoming imaging.  I have been unable to reach her but I did note that she had some itching with past imaging.  Do you want her to have the benadryl protocol prior to the appt tomorrow?  Radiology is aware of the reaction.  It has also been noted in the appt notes

## 2022-08-21 NOTE — Telephone Encounter (Signed)
Your records indicate that you have an allergy/sensitivity to one or more components within IV contrast dye. We have sent a prescription of Prednisone 50 mg (3 tablets) and Benadryl 50 mg (1 tablet) to your pharmacy as a pre-mediation for your contrasted procedure which should prevent any reaction from occurring.  Take (1) 50 mg tablet of prednisone 13 hours prior to your procedure at 8 pm.   Take (1) 50 mg tablet of prednisone 7 hours prior to your procedure at 2 am.    Take (1) 50 mg tablet of prednisone and (1) 50 mg tablet of Benadryl 1 hour prior to your procedure at 8 am.

## 2022-08-21 NOTE — Telephone Encounter (Signed)
I spoke with the pt and she states that the prescription was not received at [facility] pharmacy.  I called the pharmacy and gave a verbal order for prednisone.  The pt is aware that she can now go and pick up. The pt has been advised of the information and verbalized understanding.

## 2022-08-21 NOTE — Telephone Encounter (Signed)
Patient is returning your call.  

## 2022-08-22 ENCOUNTER — Other Ambulatory Visit: Payer: Self-pay | Admitting: Gastroenterology

## 2022-08-22 ENCOUNTER — Ambulatory Visit (HOSPITAL_COMMUNITY)
Admission: RE | Admit: 2022-08-22 | Discharge: 2022-08-22 | Disposition: A | Discharge status: Home / Self Care | Payer: Medicare Other | Source: Ambulatory Visit | Attending: Gastroenterology | Admitting: Gastroenterology

## 2022-08-22 DIAGNOSIS — K828 Other specified diseases of gallbladder: Secondary | ICD-10-CM | POA: Diagnosis not present

## 2022-08-22 DIAGNOSIS — K862 Cyst of pancreas: Secondary | ICD-10-CM

## 2022-08-22 DIAGNOSIS — K7689 Other specified diseases of liver: Secondary | ICD-10-CM

## 2022-08-22 DIAGNOSIS — K5730 Diverticulosis of large intestine without perforation or abscess without bleeding: Secondary | ICD-10-CM | POA: Diagnosis not present

## 2022-08-22 DIAGNOSIS — R935 Abnormal findings on diagnostic imaging of other abdominal regions, including retroperitoneum: Secondary | ICD-10-CM | POA: Diagnosis not present

## 2022-08-22 MED ORDER — GADOBUTROL 1 MMOL/ML IV SOLN
8.0000 mL | Freq: Once | INTRAVENOUS | Status: AC | PRN
  Administered 2022-08-22: 8 mL via INTRAVENOUS

## 2022-08-24 DIAGNOSIS — E785 Hyperlipidemia, unspecified: Secondary | ICD-10-CM | POA: Diagnosis not present

## 2022-08-24 DIAGNOSIS — I10 Essential (primary) hypertension: Secondary | ICD-10-CM | POA: Diagnosis not present

## 2022-08-24 DIAGNOSIS — E039 Hypothyroidism, unspecified: Secondary | ICD-10-CM | POA: Diagnosis not present

## 2022-08-24 DIAGNOSIS — K8020 Calculus of gallbladder without cholecystitis without obstruction: Secondary | ICD-10-CM | POA: Diagnosis not present

## 2022-08-24 DIAGNOSIS — N182 Chronic kidney disease, stage 2 (mild): Secondary | ICD-10-CM | POA: Diagnosis not present

## 2022-08-24 DIAGNOSIS — I129 Hypertensive chronic kidney disease with stage 1 through stage 4 chronic kidney disease, or unspecified chronic kidney disease: Secondary | ICD-10-CM | POA: Diagnosis not present

## 2022-08-24 DIAGNOSIS — G64 Other disorders of peripheral nervous system: Secondary | ICD-10-CM | POA: Diagnosis not present

## 2022-08-24 DIAGNOSIS — K760 Fatty (change of) liver, not elsewhere classified: Secondary | ICD-10-CM | POA: Diagnosis not present

## 2022-08-28 ENCOUNTER — Other Ambulatory Visit: Payer: Self-pay

## 2022-08-28 DIAGNOSIS — K862 Cyst of pancreas: Secondary | ICD-10-CM

## 2022-08-28 DIAGNOSIS — K7689 Other specified diseases of liver: Secondary | ICD-10-CM

## 2022-08-28 DIAGNOSIS — K838 Other specified diseases of biliary tract: Secondary | ICD-10-CM

## 2022-09-18 ENCOUNTER — Ambulatory Visit (INDEPENDENT_AMBULATORY_CARE_PROVIDER_SITE_OTHER): Payer: Medicare Other | Admitting: Family Medicine

## 2022-09-18 ENCOUNTER — Other Ambulatory Visit: Payer: Self-pay

## 2022-09-18 ENCOUNTER — Encounter: Payer: Self-pay | Admitting: Family Medicine

## 2022-09-18 ENCOUNTER — Ambulatory Visit (INDEPENDENT_AMBULATORY_CARE_PROVIDER_SITE_OTHER): Payer: Medicare Other

## 2022-09-18 VITALS — BP 112/72 | HR 66 | Ht 65.0 in | Wt 178.0 lb

## 2022-09-18 DIAGNOSIS — G8929 Other chronic pain: Secondary | ICD-10-CM

## 2022-09-18 DIAGNOSIS — M545 Low back pain, unspecified: Secondary | ICD-10-CM | POA: Diagnosis not present

## 2022-09-18 DIAGNOSIS — M1711 Unilateral primary osteoarthritis, right knee: Secondary | ICD-10-CM | POA: Diagnosis not present

## 2022-09-18 DIAGNOSIS — M5136 Other intervertebral disc degeneration, lumbar region: Secondary | ICD-10-CM | POA: Diagnosis not present

## 2022-09-18 DIAGNOSIS — M25561 Pain in right knee: Secondary | ICD-10-CM | POA: Diagnosis not present

## 2022-09-18 DIAGNOSIS — M549 Dorsalgia, unspecified: Secondary | ICD-10-CM | POA: Diagnosis not present

## 2022-09-18 DIAGNOSIS — M51369 Other intervertebral disc degeneration, lumbar region without mention of lumbar back pain or lower extremity pain: Secondary | ICD-10-CM

## 2022-09-18 NOTE — Patient Instructions (Addendum)
You have 14 days to return or exchange your brace Call [phone], then return the brace to our office Do prescribed exercises at least 3x a week Xrays today Increase Cymbalta to 40mg  See you again at next appointment

## 2022-09-18 NOTE — Progress Notes (Signed)
[name] [address] [place] [address] Phone: [phone] Subjective:   Abigail Robinson, am serving as a scribe for Dr. Antoine Primas.  I'm seeing this patient by the request  of:  Melida Quitter, MD  CC: Right knee pain  [id number]  Abigail Robinson is a 72 y.o. female coming in with complaint of R knee pain. Has been seen recently for shoulder and lumbar spine pain. Patient states that she reached down to pick something up and she felt pain in her patella over anterior aspect. Felt a couple of pops in the knee. Does have pain that radiates from R side of lumbar spine into the R hip and knee. Has bene using a knee brace.       Past Medical History:  Diagnosis Date   Allergy    Arthritis    back, hands   Basal cell carcinoma    face   Colon polyps    Complication of anesthesia    prolonged sedation, slow to wake up   Diplopia    Endometriosis    GERD (gastroesophageal reflux disease)    Headache    Migraines   Hyperlipidemia    Hypertension    Hypothyroidism    Skin cancer    hx of   Status post dilation of esophageal narrowing    Thalamic cyst    Thyroid disease    Vertigo    Past Surgical History:  Procedure Laterality Date   ANTERIOR AND POSTERIOR REPAIR WITH SACROSPINOUS FIXATION N/A 07/08/2015   Procedure: ANTERIOR AND POSTERIOR REPAIR ;  Surgeon: Candice Camp, MD;  Location: [facility];  Service: Gynecology;  Laterality: N/A;   COLONOSCOPY     DIAGNOSTIC LAPAROSCOPY     LAPAROSCOPIC VAGINAL HYSTERECTOMY WITH SALPINGO OOPHORECTOMY Bilateral 07/08/2015   Procedure: LAPAROSCOPIC ASSISTED VAGINAL HYSTERECTOMY WITH BILATERAL SALPINGO OOPHORECTOMY;  Surgeon: Candice Camp, MD;  Location: [facility];  Service: Gynecology;  Laterality: Bilateral;   OOPHORECTOMY  age 72   for endometrosis   Social History   Socioeconomic History   Marital status: Widowed    Spouse name: Not on file   Number of children: 0   Years of education:  Not on file   Highest education level: Bachelor's degree (e.g., BA, AB, BS)  Occupational History    Comment: retired  Tobacco Use   Smoking status: Never   Smokeless tobacco: Never  Vaping Use   Vaping Use: Never used  Substance and Sexual Activity   Alcohol use: Not Currently   Drug use: No   Sexual activity: Not on file  Other Topics Concern   Not on file  Social History Narrative   Lives alone   Social Determinants of Health   Financial Resource Strain: Not on file  Food Insecurity: Not on file  Transportation Needs: Not on file  Physical Activity: Not on file  Stress: Not on file  Social Connections: Not on file   Allergies  Allergen Reactions   Demerol [Meperidine] Nausea Only and Other (See Comments)    Reaction=headache   Family History  Problem Relation Age of Onset   Liver cancer Mother    Kidney cancer Mother    Lung cancer Mother    Heart disease Father    Hypertension Father    Cirrhosis Father    Macular degeneration Father    Diabetes Brother    Heart attack Brother    Breast cancer Maternal Grandmother    Stroke  Maternal Grandmother    Colon cancer Neg Hx    Esophageal cancer Neg Hx    Stomach cancer Neg Hx     Current Outpatient Medications (Endocrine & Metabolic):    levothyroxine (SYNTHROID, LEVOTHROID) 75 MCG tablet, Take 75 mcg by mouth at bedtime.    predniSONE (DELTASONE) 50 MG tablet, Take 1 tab 13 hours prior to your upcoming procedure Take 1 tab 7 hours prior to your upcoming procedure  Take 1 tab 1 hour prior to your procedure.  Current Outpatient Medications (Cardiovascular):    atenolol (TENORMIN) 50 MG tablet, Take 1 tablet by mouth Daily.   hydrochlorothiazide (MICROZIDE) 12.5 MG capsule, Take 12.5 mg by mouth daily.   losartan (COZAAR) 100 MG tablet, Take 100 mg by mouth daily.   Current Outpatient Medications (Analgesics):    acetaminophen (TYLENOL) 500 MG tablet, Take 1,000 mg by mouth 2 (two) times daily as needed for  moderate pain or headache.    celecoxib (CELEBREX) 200 MG capsule, Take 200 mg by mouth daily.  Current Outpatient Medications (Hematological):    vitamin B-12 (CYANOCOBALAMIN) 1000 MCG tablet, Take 1,000 mcg by mouth daily.  Current Outpatient Medications (Other):    Calcium Carb-Cholecalciferol 500-600 MG-UNIT TABS, Take 1 tablet by mouth daily.   DULoxetine (CYMBALTA) 20 MG capsule, Take 1 capsule (20 mg total) by mouth daily.   gabapentin (NEURONTIN) 100 MG capsule, Take 2 capsules (200 mg total) by mouth at bedtime.   meclizine (ANTIVERT) 25 MG tablet, Take 1 tablet (25 mg total) by mouth 3 (three) times daily as needed for dizziness.   omeprazole (PRILOSEC) 20 MG capsule, Take 20 mg by mouth daily as needed (for heartburn).    Probiotic Product (PROBIOTIC PO), Take 1 capsule by mouth daily. Ultimate 16 strain w/ trace minerals & sos   tiZANidine (ZANAFLEX) 2 MG tablet, Take 1 tablet (2 mg total) by mouth at bedtime.   Vitamins A & D (VITAMIN A & D) 5000-400 UNITS CAPS, Take 1 capsule by mouth daily.   Reviewed prior external information including notes and imaging from  primary care provider As well as notes that were available from care everywhere and other healthcare systems.  Past medical history, social, surgical and family history all reviewed in electronic medical record.  No pertanent information unless stated regarding to the chief complaint.   Review of Systems:  No headache, visual changes, nausea, vomiting, diarrhea, constipation, dizziness, abdominal pain, skin rash, fevers, chills, night sweats, weight loss, swollen lymph nodes, body aches, joint swelling, chest pain, shortness of breath, mood changes. POSITIVE muscle aches  Objective  Blood pressure 112/72, pulse 66, height 5\' 5"  (1.651 m), weight 178 lb (80.7 kg), SpO2 93 %.   General: No apparent distress alert and oriented x3 mood and affect normal, dressed appropriately.  HEENT: Pupils equal, extraocular  movements intact  Respiratory: Patient's speak in full sentences and does not appear short of breath  Cardiovascular: No lower extremity edema, non tender, no erythema  Right knee exam does have some crepitus noted.  Does have some tenderness to palpation over the patellofemoral joint.  Patient does have lateral tracking of the patella noted.  Lacks last 10 degrees of extension.  Limited muscular skeletal ultrasound was performed and interpreted by Antoine Primas, M  Limited ultrasound shows patient does have some significant narrowing of the patellofemoral joint noted.  Patient does have trace of hypoechoic changes consistent with an effusion as well.  Mild narrowing of the lateral and medial joint  space. Impression: Patellofemoral arthritis    Impression and Recommendations:    The above documentation has been reviewed and is accurate and complete Judi Saa, DO

## 2022-09-18 NOTE — Assessment & Plan Note (Addendum)
Tru pull lite brace given, discussed VMO strengthening and hip abductor strengthening.  Discussed which activities to do and which ones to avoid.  Increase activity slowly.  Follow-up with me again in 6 to 8 weeks.  Worsening pain consider an injection

## 2022-09-18 NOTE — Assessment & Plan Note (Signed)
Worsening pain secondary to the antalgic aspect.  Increase Cymbalta to 40 mg.  See how patient responds.  Monitor symptoms at this time.  Will follow-up again in 4 to 6 weeks

## 2022-10-24 ENCOUNTER — Ambulatory Visit: Payer: Medicare Other | Admitting: Gastroenterology

## 2022-10-26 ENCOUNTER — Other Ambulatory Visit: Payer: Self-pay | Admitting: Family Medicine

## 2022-10-26 MED ORDER — DULOXETINE HCL 20 MG PO CPEP: 20.0000 mg | ORAL_CAPSULE | Freq: Every day | ORAL | 0 refills | Status: DC

## 2022-11-13 NOTE — Progress Notes (Unsigned)
[name] [address] [place] [address] Phone: [phone] Subjective:   Abigail Robinson, am serving as a scribe for Dr. Antoine Primas.  I'm seeing this patient by the request  of:  Melida Quitter, MD  CC:   [id number]  09/18/2022 Worsening pain secondary to the antalgic aspect. Increase Cymbalta to 40 mg. See how patient responds. Monitor symptoms at this time. Will follow-up again in 4 to 6 weeks   Tru pull lite brace given, discussed VMO strengthening and hip abductor strengthening.  Discussed which activities to do and which ones to avoid.  Increase activity slowly.  Follow-up with me again in 6 to 8 weeks.  Worsening pain consider an injection   Update 11/13/2022 Abigail Robinson is a 72 y.o. female coming in with complaint of DDD, lumbar spine and R knee pain. Patient states doing well. L thumb and R hip pain.  Right hip and the left thumb seems to be the worst.  Given her significant discomfort that is affecting daily activities.  And seems to be more winded with movement wakes her up at night on the hip and repetitive activities when it comes to her thumb.      Past Medical History:  Diagnosis Date   Allergy    Arthritis    back, hands   Basal cell carcinoma    face   Colon polyps    Complication of anesthesia    prolonged sedation, slow to wake up   Diplopia    Endometriosis    GERD (gastroesophageal reflux disease)    Headache    Migraines   Hyperlipidemia    Hypertension    Hypothyroidism    Skin cancer    hx of   Status post dilation of esophageal narrowing    Thalamic cyst    Thyroid disease    Vertigo    Past Surgical History:  Procedure Laterality Date   ANTERIOR AND POSTERIOR REPAIR WITH SACROSPINOUS FIXATION N/A 07/08/2015   Procedure: ANTERIOR AND POSTERIOR REPAIR ;  Surgeon: Candice Camp, MD;  Location: [facility];  Service: Gynecology;  Laterality: N/A;   COLONOSCOPY     DIAGNOSTIC LAPAROSCOPY     LAPAROSCOPIC  VAGINAL HYSTERECTOMY WITH SALPINGO OOPHORECTOMY Bilateral 07/08/2015   Procedure: LAPAROSCOPIC ASSISTED VAGINAL HYSTERECTOMY WITH BILATERAL SALPINGO OOPHORECTOMY;  Surgeon: Candice Camp, MD;  Location: [facility];  Service: Gynecology;  Laterality: Bilateral;   OOPHORECTOMY  age 40   for endometrosis   Social History   Socioeconomic History   Marital status: Widowed    Spouse name: Not on file   Number of children: 0   Years of education: Not on file   Highest education level: Bachelor's degree (e.g., BA, AB, BS)  Occupational History    Comment: retired  Tobacco Use   Smoking status: Never   Smokeless tobacco: Never  Vaping Use   Vaping status: Never Used  Substance and Sexual Activity   Alcohol use: Not Currently   Drug use: No   Sexual activity: Not on file  Other Topics Concern   Not on file  Social History Narrative   Lives alone   Social Determinants of Health   Financial Resource Strain: Not on file  Food Insecurity: Not on file  Transportation Needs: Not on file  Physical Activity: Not on file  Stress: Not on file  Social Connections: Not on file   Allergies  Allergen Reactions   Demerol [Meperidine] Nausea Only and Other (See  Comments)    Reaction=headache   Family History  Problem Relation Age of Onset   Liver cancer Mother    Kidney cancer Mother    Lung cancer Mother    Heart disease Father    Hypertension Father    Cirrhosis Father    Macular degeneration Father    Diabetes Brother    Heart attack Brother    Breast cancer Maternal Grandmother    Stroke Maternal Grandmother    Colon cancer Neg Hx    Esophageal cancer Neg Hx    Stomach cancer Neg Hx     Current Outpatient Medications (Endocrine & Metabolic):    levothyroxine (SYNTHROID, LEVOTHROID) 75 MCG tablet, Take 75 mcg by mouth at bedtime.    predniSONE (DELTASONE) 50 MG tablet, Take 1 tab 13 hours prior to your upcoming procedure Take 1 tab 7 hours prior to your upcoming procedure  Take 1 tab  1 hour prior to your procedure.  Current Outpatient Medications (Cardiovascular):    atenolol (TENORMIN) 50 MG tablet, Take 1 tablet by mouth Daily.   hydrochlorothiazide (MICROZIDE) 12.5 MG capsule, Take 12.5 mg by mouth daily.   losartan (COZAAR) 100 MG tablet, Take 100 mg by mouth daily.   Current Outpatient Medications (Analgesics):    acetaminophen (TYLENOL) 500 MG tablet, Take 1,000 mg by mouth 2 (two) times daily as needed for moderate pain or headache.    celecoxib (CELEBREX) 200 MG capsule, Take 200 mg by mouth daily.  Current Outpatient Medications (Hematological):    vitamin B-12 (CYANOCOBALAMIN) 1000 MCG tablet, Take 1,000 mcg by mouth daily.  Current Outpatient Medications (Other):    Calcium Carb-Cholecalciferol 500-600 MG-UNIT TABS, Take 1 tablet by mouth daily.   DULoxetine (CYMBALTA) 20 MG capsule, Take 2 capsules (40 mg total) by mouth daily.   gabapentin (NEURONTIN) 100 MG capsule, Take 2 capsules (200 mg total) by mouth at bedtime.   meclizine (ANTIVERT) 25 MG tablet, Take 1 tablet (25 mg total) by mouth 3 (three) times daily as needed for dizziness.   omeprazole (PRILOSEC) 20 MG capsule, Take 20 mg by mouth daily as needed (for heartburn).    Probiotic Product (PROBIOTIC PO), Take 1 capsule by mouth daily. Ultimate 16 strain w/ trace minerals & sos   tiZANidine (ZANAFLEX) 2 MG tablet, Take 1 tablet (2 mg total) by mouth at bedtime.   Vitamins A & D (VITAMIN A & D) 5000-400 UNITS CAPS, Take 1 capsule by mouth daily.   Reviewed prior external information including notes and imaging from  primary care provider As well as notes that were available from care everywhere and other healthcare systems.  Past medical history, social, surgical and family history all reviewed in electronic medical record.  No pertanent information unless stated regarding to the chief complaint.   Review of Systems:  No headache, visual changes, nausea, vomiting, diarrhea, constipation,  dizziness, abdominal pain, skin rash, fevers, chills, night sweats, weight loss, swollen lymph nodes, joint swelling, chest pain, shortness of breath, mood changes. POSITIVE muscle aches, body aches  Objective  Blood pressure (!) 96/58, pulse 73, height 5\' 5"  (1.651 m), weight 170 lb (77.1 kg), SpO2 96%.   General: No apparent distress alert and oriented x3 mood and affect normal, dressed appropriately.  HEENT: Pupils equal, extraocular movements intact  Respiratory: Patient's speak in full sentences and does not appear short of breath  Cardiovascular: No lower extremity edema, non tender, no erythema  Patient's left thumb does have some CMC arthritic changes noted.  Mild atrophy noted.  Positive grind test noted. Right hip exam severe tenderness to palpation over the greater trochanteric area. Back exam does have some tenderness to palpation diffusely in the lumbar spine mostly right greater than left.  Procedure: Real-time Ultrasound Guided Injection of left CMC joint Device: GE Logiq Q7 Ultrasound guided injection is preferred based studies that show increased duration, increased effect, greater accuracy, decreased procedural pain, increased response rate, and decreased cost with ultrasound guided versus blind injection.  Verbal informed consent obtained.  Time-out conducted.  Noted no overlying erythema, induration, or other signs of local infection.  Skin prepped in a sterile fashion.  Local anesthesia: Topical Ethyl chloride.  With sterile technique and under real time ultrasound guidance: With a 25-gauge half inch needle injected with 0.5 cc of 0.5% Marcaine and 0.5 cc of Kenalog 40 mg/mL Completed without difficulty  Pain immediately resolved suggesting accurate placement of the medication.  Advised to call if fevers/chills, erythema, induration, drainage, or persistent bleeding.  Impression: Technically successful ultrasound guided injection.   Procedure: Real-time Ultrasound  Guided Injection of right greater trochanteric bursitis secondary to patient's body habitus Device: GE Logiq Q7 Ultrasound guided injection is preferred based studies that show increased duration, increased effect, greater accuracy, decreased procedural pain, increased response rate, and decreased cost with ultrasound guided versus blind injection.  Verbal informed consent obtained.  Time-out conducted.  Noted no overlying erythema, induration, or other signs of local infection.  Skin prepped in a sterile fashion.  Local anesthesia: Topical Ethyl chloride.  With sterile technique and under real time ultrasound guidance:  Greater trochanteric area was visualized and patient's bursa was noted. A 22-gauge 3 inch needle was inserted and 4 cc of 0.5% Marcaine and 1 cc of Kenalog 40 mg/dL was injected. Pictures taken Completed without difficulty  Pain immediately resolved suggesting accurate placement of the medication.  Advised to call if fevers/chills, erythema, induration, drainage, or persistent bleeding.  Impression: Technically successful ultrasound guided injection.    Impression and Recommendations:    The above documentation has been reviewed and is accurate and complete Abigail Saa, DO

## 2022-11-14 ENCOUNTER — Encounter: Payer: Self-pay | Admitting: Family Medicine

## 2022-11-14 ENCOUNTER — Ambulatory Visit (INDEPENDENT_AMBULATORY_CARE_PROVIDER_SITE_OTHER): Payer: Medicare Other | Admitting: Family Medicine

## 2022-11-14 ENCOUNTER — Other Ambulatory Visit: Payer: Self-pay

## 2022-11-14 VITALS — BP 96/58 | HR 73 | Ht 65.0 in | Wt 170.0 lb

## 2022-11-14 DIAGNOSIS — M79645 Pain in left finger(s): Secondary | ICD-10-CM

## 2022-11-14 DIAGNOSIS — M1812 Unilateral primary osteoarthritis of first carpometacarpal joint, left hand: Secondary | ICD-10-CM

## 2022-11-14 DIAGNOSIS — M7061 Trochanteric bursitis, right hip: Secondary | ICD-10-CM

## 2022-11-14 MED ORDER — DULOXETINE HCL 20 MG PO CPEP: 40.0000 mg | ORAL_CAPSULE | Freq: Every day | ORAL | 0 refills | Status: DC

## 2022-11-14 NOTE — Assessment & Plan Note (Signed)
Patient given injection and tolerated the procedure well, discussed icing regimen and home exercises, discussed which activities to do and which ones to avoid.  Increase activity slowly.  Discussed icing regimen and home exercises.  Follow-up again in 6 to 8 weeks

## 2022-11-14 NOTE — Assessment & Plan Note (Signed)
Patient given injection and tolerated the procedure well, discussed icing regimen and home exercises, discussed which activities to do and which ones to avoid.  Increase activity slowly.  Differential includes lumbar radiculopathy and I think there is highly likely that this is playing somewhat of a role.  Discussed different medications again.  Discussed that worsening pain and MRI would be necessary of the back.  Follow-up again in 6 to 8 weeks otherwise.

## 2022-11-14 NOTE — Patient Instructions (Addendum)
Good to see you Injections given today If not better will consider MRI Follow up in 2 months

## 2023-01-11 NOTE — Progress Notes (Unsigned)
[name] [address] [place] [address] Phone: [phone] Subjective:   Bruce Donath, am serving as a scribe for Dr. Antoine Primas.  I'm seeing this patient by the request  of:  Melida Quitter, MD  CC: Left shoulder pain follow-up  [id number]  11/14/2022 Patient given injection and tolerated the procedure well, discussed icing regimen and home exercises, discussed which activities to do and which ones to avoid.  Increase activity slowly.  Discussed icing regimen and home exercises.  Follow-up again in 6 to 8 weeks    Patient given injection and tolerated the procedure well, discussed icing regimen and home exercises, discussed which activities to do and which ones to avoid. Increase activity slowly. Differential includes lumbar radiculopathy and I think there is highly likely that this is playing somewhat of a role. Discussed different medications again. Discussed that worsening pain and MRI would be necessary of the back. Follow-up again in 6 to 8 weeks otherwise.   Updated 01/17/2023 KADASIA CHISENHALL is a 72 y.o. female coming in with complaint of thumb and R hip pain. Hip pain improves after she starts moving around. Pain over lateral aspect.   Patients that L shoulder is bothering her after pulling some brush. Shoulder pain has started to improve though. Pain was radiating down into upper arm.   Thumb pain has subsided.       Past Medical History:  Diagnosis Date   Allergy    Arthritis    back, hands   Basal cell carcinoma    face   Colon polyps    Complication of anesthesia    prolonged sedation, slow to wake up   Diplopia    Endometriosis    GERD (gastroesophageal reflux disease)    Headache    Migraines   Hyperlipidemia    Hypertension    Hypothyroidism    Skin cancer    hx of   Status post dilation of esophageal narrowing    Thalamic cyst    Thyroid disease    Vertigo    Past Surgical History:  Procedure  Laterality Date   ANTERIOR AND POSTERIOR REPAIR WITH SACROSPINOUS FIXATION N/A 07/08/2015   Procedure: ANTERIOR AND POSTERIOR REPAIR ;  Surgeon: Candice Camp, MD;  Location: [facility];  Service: Gynecology;  Laterality: N/A;   COLONOSCOPY     DIAGNOSTIC LAPAROSCOPY     LAPAROSCOPIC VAGINAL HYSTERECTOMY WITH SALPINGO OOPHORECTOMY Bilateral 07/08/2015   Procedure: LAPAROSCOPIC ASSISTED VAGINAL HYSTERECTOMY WITH BILATERAL SALPINGO OOPHORECTOMY;  Surgeon: Candice Camp, MD;  Location: [facility];  Service: Gynecology;  Laterality: Bilateral;   OOPHORECTOMY  age 44   for endometrosis   Social History   Socioeconomic History   Marital status: Widowed    Spouse name: Not on file   Number of children: 0   Years of education: Not on file   Highest education level: Bachelor's degree (e.g., BA, AB, BS)  Occupational History    Comment: retired  Tobacco Use   Smoking status: Never   Smokeless tobacco: Never  Vaping Use   Vaping status: Never Used  Substance and Sexual Activity   Alcohol use: Not Currently   Drug use: No   Sexual activity: Not on file  Other Topics Concern   Not on file  Social History Narrative   Lives alone   Social Determinants of Health   Financial Resource Strain: Not on file  Food Insecurity: Not on file  Transportation Needs: Not on file  Physical Activity: Not on file  Stress: Not on file  Social Connections: Not on file   Allergies  Allergen Reactions   Demerol [Meperidine] Nausea Only and Other (See Comments)    Reaction=headache   Family History  Problem Relation Age of Onset   Liver cancer Mother    Kidney cancer Mother    Lung cancer Mother    Heart disease Father    Hypertension Father    Cirrhosis Father    Macular degeneration Father    Diabetes Brother    Heart attack Brother    Breast cancer Maternal Grandmother    Stroke Maternal Grandmother    Colon cancer Neg Hx    Esophageal cancer Neg Hx    Stomach cancer Neg Hx     Current Outpatient  Medications (Endocrine & Metabolic):    levothyroxine (SYNTHROID, LEVOTHROID) 75 MCG tablet, Take 75 mcg by mouth at bedtime.    predniSONE (DELTASONE) 50 MG tablet, Take 1 tab 13 hours prior to your upcoming procedure Take 1 tab 7 hours prior to your upcoming procedure  Take 1 tab 1 hour prior to your procedure.  Current Outpatient Medications (Cardiovascular):    atenolol (TENORMIN) 50 MG tablet, Take 1 tablet by mouth Daily.   hydrochlorothiazide (MICROZIDE) 12.5 MG capsule, Take 12.5 mg by mouth daily.   losartan (COZAAR) 100 MG tablet, Take 100 mg by mouth daily.   Current Outpatient Medications (Analgesics):    acetaminophen (TYLENOL) 500 MG tablet, Take 1,000 mg by mouth 2 (two) times daily as needed for moderate pain or headache.    celecoxib (CELEBREX) 200 MG capsule, Take 200 mg by mouth daily.  Current Outpatient Medications (Hematological):    vitamin B-12 (CYANOCOBALAMIN) 1000 MCG tablet, Take 1,000 mcg by mouth daily.  Current Outpatient Medications (Other):    Calcium Carb-Cholecalciferol 500-600 MG-UNIT TABS, Take 1 tablet by mouth daily.   DULoxetine (CYMBALTA) 20 MG capsule, Take 2 capsules (40 mg total) by mouth daily.   gabapentin (NEURONTIN) 100 MG capsule, Take 2 capsules (200 mg total) by mouth at bedtime.   meclizine (ANTIVERT) 25 MG tablet, Take 1 tablet (25 mg total) by mouth 3 (three) times daily as needed for dizziness.   omeprazole (PRILOSEC) 20 MG capsule, Take 20 mg by mouth daily as needed (for heartburn).    Probiotic Product (PROBIOTIC PO), Take 1 capsule by mouth daily. Ultimate 16 strain w/ trace minerals & sos   tiZANidine (ZANAFLEX) 2 MG tablet, Take 1 tablet (2 mg total) by mouth at bedtime.   Vitamins A & D (VITAMIN A & D) 5000-400 UNITS CAPS, Take 1 capsule by mouth daily.   Reviewed prior external information including notes and imaging from  primary care provider As well as notes that were available from care everywhere and other healthcare  systems.  Past medical history, social, surgical and family history all reviewed in electronic medical record.  No pertanent information unless stated regarding to the chief complaint.   Review of Systems:  No headache, visual changes, nausea, vomiting, diarrhea, constipation, dizziness, abdominal pain, skin rash, fevers, chills, night sweats, weight loss, swollen lymph nodes, body aches, joint swelling, chest pain, shortness of breath, mood changes. POSITIVE muscle aches  Objective  Blood pressure 110/70, pulse 64, height 5\' 5"  (1.651 m), weight 167 lb (75.8 kg), SpO2 96%.   General: No apparent distress alert and oriented x3 mood and affect normal, dressed appropriately.  HEENT: Pupils equal, extraocular movements intact  Respiratory: Patient's speak in full  sentences and does not appear short of breath  Cardiovascular: No lower extremity edema, non tender, no erythema  Left shoulder does have some arthritic changes noted.  Patient does have severe tenderness over the acromioclavicular joint today.  Patient still has weakness of the rotator cuff noted.   Procedure: Real-time Ultrasound Guided Injection of left acromioclavicular joint Device: GE Logiq Q7 Ultrasound guided injection is preferred based studies that show increased duration, increased effect, greater accuracy, decreased procedural pain, increased response rate, and decreased cost with ultrasound guided versus blind injection.  Verbal informed consent obtained.  Time-out conducted.  Noted no overlying erythema, induration, or other signs of local infection.  Skin prepped in a sterile fashion.  Local anesthesia: Topical Ethyl chloride.  With sterile technique and under real time ultrasound guidance: With a 25-gauge half inch needle injected with 0.5 cc of 0.5% Marcaine and 0.5 cc of Kenalog 40 mg/mL. Completed without difficulty  Pain immediately resolved suggesting accurate placement of the medication.  Advised to call if  fevers/chills, erythema, induration, drainage, or persistent bleeding.  Impression: Technically successful ultrasound guided injection.   Impression and Recommendations:     The above documentation has been reviewed and is accurate and complete Judi Saa, DO

## 2023-01-17 ENCOUNTER — Other Ambulatory Visit: Payer: Self-pay

## 2023-01-17 ENCOUNTER — Ambulatory Visit: Payer: Medicare Other | Admitting: Family Medicine

## 2023-01-17 VITALS — BP 110/70 | HR 64 | Ht 65.0 in | Wt 167.0 lb

## 2023-01-17 DIAGNOSIS — M19012 Primary osteoarthritis, left shoulder: Secondary | ICD-10-CM

## 2023-01-17 DIAGNOSIS — M12812 Other specific arthropathies, not elsewhere classified, left shoulder: Secondary | ICD-10-CM

## 2023-01-17 DIAGNOSIS — G8929 Other chronic pain: Secondary | ICD-10-CM | POA: Diagnosis not present

## 2023-01-17 DIAGNOSIS — M79645 Pain in left finger(s): Secondary | ICD-10-CM

## 2023-01-17 NOTE — Patient Instructions (Signed)
See me again in 3 months Injected AC joint today

## 2023-01-18 ENCOUNTER — Encounter: Payer: Self-pay | Admitting: Family Medicine

## 2023-01-18 NOTE — Assessment & Plan Note (Signed)
Repeat injection given today, tolerated the procedure well.  Discussed icing regimen and home exercises.  Discussed with patient to keep hands within the peripheral vision.  Follow-up again in 6 to 8 weeks.

## 2023-01-18 NOTE — Assessment & Plan Note (Signed)
Known rotator cuff arthropathy.  Seems that the rotator cuff seems to be doing somewhat better at this time.  Will follow-up again in 6 to 8 weeks for further evaluation

## 2023-02-20 DIAGNOSIS — E785 Hyperlipidemia, unspecified: Secondary | ICD-10-CM | POA: Diagnosis not present

## 2023-02-21 DIAGNOSIS — I129 Hypertensive chronic kidney disease with stage 1 through stage 4 chronic kidney disease, or unspecified chronic kidney disease: Secondary | ICD-10-CM | POA: Diagnosis not present

## 2023-02-21 DIAGNOSIS — E785 Hyperlipidemia, unspecified: Secondary | ICD-10-CM | POA: Diagnosis not present

## 2023-02-21 DIAGNOSIS — E039 Hypothyroidism, unspecified: Secondary | ICD-10-CM | POA: Diagnosis not present

## 2023-02-21 DIAGNOSIS — N182 Chronic kidney disease, stage 2 (mild): Secondary | ICD-10-CM | POA: Diagnosis not present

## 2023-02-27 DIAGNOSIS — N182 Chronic kidney disease, stage 2 (mild): Secondary | ICD-10-CM | POA: Diagnosis not present

## 2023-02-27 DIAGNOSIS — E663 Overweight: Secondary | ICD-10-CM | POA: Diagnosis not present

## 2023-02-27 DIAGNOSIS — Z1331 Encounter for screening for depression: Secondary | ICD-10-CM | POA: Diagnosis not present

## 2023-02-27 DIAGNOSIS — G729 Myopathy, unspecified: Secondary | ICD-10-CM | POA: Diagnosis not present

## 2023-02-27 DIAGNOSIS — Z1339 Encounter for screening examination for other mental health and behavioral disorders: Secondary | ICD-10-CM | POA: Diagnosis not present

## 2023-02-27 DIAGNOSIS — Z23 Encounter for immunization: Secondary | ICD-10-CM | POA: Diagnosis not present

## 2023-02-27 DIAGNOSIS — E785 Hyperlipidemia, unspecified: Secondary | ICD-10-CM | POA: Diagnosis not present

## 2023-02-27 DIAGNOSIS — K219 Gastro-esophageal reflux disease without esophagitis: Secondary | ICD-10-CM | POA: Diagnosis not present

## 2023-02-27 DIAGNOSIS — Z6826 Body mass index (BMI) 26.0-26.9, adult: Secondary | ICD-10-CM | POA: Diagnosis not present

## 2023-02-27 DIAGNOSIS — I129 Hypertensive chronic kidney disease with stage 1 through stage 4 chronic kidney disease, or unspecified chronic kidney disease: Secondary | ICD-10-CM | POA: Diagnosis not present

## 2023-02-27 DIAGNOSIS — M85859 Other specified disorders of bone density and structure, unspecified thigh: Secondary | ICD-10-CM | POA: Diagnosis not present

## 2023-02-27 DIAGNOSIS — E039 Hypothyroidism, unspecified: Secondary | ICD-10-CM | POA: Diagnosis not present

## 2023-02-27 DIAGNOSIS — Z0000 Encounter for general adult medical examination without abnormal findings: Secondary | ICD-10-CM | POA: Diagnosis not present

## 2023-02-27 DIAGNOSIS — K8020 Calculus of gallbladder without cholecystitis without obstruction: Secondary | ICD-10-CM | POA: Diagnosis not present

## 2023-02-28 ENCOUNTER — Other Ambulatory Visit: Payer: Self-pay | Admitting: Internal Medicine

## 2023-02-28 DIAGNOSIS — M85859 Other specified disorders of bone density and structure, unspecified thigh: Secondary | ICD-10-CM

## 2023-02-28 DIAGNOSIS — Z0000 Encounter for general adult medical examination without abnormal findings: Secondary | ICD-10-CM

## 2023-03-06 DIAGNOSIS — E785 Hyperlipidemia, unspecified: Secondary | ICD-10-CM | POA: Diagnosis not present

## 2023-03-06 DIAGNOSIS — G729 Myopathy, unspecified: Secondary | ICD-10-CM | POA: Diagnosis not present

## 2023-03-23 ENCOUNTER — Ambulatory Visit: Payer: Self-pay | Admitting: Surgery

## 2023-03-23 DIAGNOSIS — K8020 Calculus of gallbladder without cholecystitis without obstruction: Secondary | ICD-10-CM | POA: Diagnosis not present

## 2023-03-26 ENCOUNTER — Ambulatory Visit: Payer: Medicare Other

## 2023-04-02 ENCOUNTER — Other Ambulatory Visit: Payer: Self-pay

## 2023-04-02 ENCOUNTER — Other Ambulatory Visit: Payer: Self-pay | Admitting: Family Medicine

## 2023-04-02 MED ORDER — GABAPENTIN 100 MG PO CAPS: 200.0000 mg | ORAL_CAPSULE | Freq: Every day | ORAL | 0 refills | Status: DC

## 2023-04-02 MED ORDER — DULOXETINE HCL 20 MG PO CPEP: 40.0000 mg | ORAL_CAPSULE | Freq: Every day | ORAL | 0 refills | Status: DC

## 2023-04-13 ENCOUNTER — Ambulatory Visit: Payer: Medicare Other

## 2023-04-16 NOTE — Progress Notes (Unsigned)
[facility] [address] [place] [address] Phone: [phone] Subjective:   [facility], am serving as a scribe for Dr. Antoine [facility].  I'm seeing this patient by the request  of:  Abigail Quitter, MD  CC: Multiple joint/shoulder  [id number]  01/17/2023 Known rotator cuff arthropathy.  Seems that the rotator cuff seems to be doing somewhat better at this time.  Will follow-up again in 6 to 8 weeks for further evaluation    Repeat injection given today, tolerated the procedure well. Discussed icing regimen and home exercises. Discussed with patient to keep hands within the peripheral vision. Follow-up again in 6 to 8 weeks.   Updated 04/18/2023 Abigail Robinson is a 73 y.o. female coming in with complaint of shoulder pain. Shoulder, thumb, and back pain today. Shoulder is getting worse. Injection for the thumb helped last time.       Past Medical History:  Diagnosis Date   Allergy    Arthritis    back, hands   Basal cell carcinoma    face   Colon polyps    Complication of anesthesia    prolonged sedation, slow to wake up   Diplopia    Endometriosis    GERD (gastroesophageal reflux disease)    Headache    Migraines   Hyperlipidemia    Hypertension    Hypothyroidism    Skin cancer    hx of   Status post dilation of esophageal narrowing    Thalamic cyst    Thyroid disease    Vertigo    Past Surgical History:  Procedure Laterality Date   ANTERIOR AND POSTERIOR REPAIR WITH SACROSPINOUS FIXATION N/A 07/08/2015   Procedure: ANTERIOR AND POSTERIOR REPAIR ;  Surgeon: Abigail Camp, MD;  Location: [facility];  Service: Gynecology;  Laterality: N/A;   COLONOSCOPY     DIAGNOSTIC LAPAROSCOPY     LAPAROSCOPIC VAGINAL HYSTERECTOMY WITH SALPINGO OOPHORECTOMY Bilateral 07/08/2015   Procedure: LAPAROSCOPIC ASSISTED VAGINAL HYSTERECTOMY WITH BILATERAL SALPINGO OOPHORECTOMY;  Surgeon: Abigail Camp, MD;  Location: [facility];  Service: Gynecology;   Laterality: Bilateral;   OOPHORECTOMY  age 1   for endometrosis   Social History   Socioeconomic History   Marital status: Widowed    Spouse name: Not on file   Number of children: 0   Years of education: Not on file   Highest education level: Bachelor's degree (e.g., BA, AB, BS)  Occupational History    Comment: retired  Tobacco Use   Smoking status: Never   Smokeless tobacco: Never  Vaping Use   Vaping status: Never Used  Substance and Sexual Activity   Alcohol use: Not Currently   Drug use: No   Sexual activity: Not on file  Other Topics Concern   Not on file  Social History Narrative   Lives alone   Social Drivers of Health   Financial Resource Strain: Not on file  Food Insecurity: Not on file  Transportation Needs: Not on file  Physical Activity: Not on file  Stress: Not on file  Social Connections: Not on file   Allergies  Allergen Reactions   Demerol [Meperidine] Nausea Only and Other (See Comments)    Reaction=headache   Family History  Problem Relation Age of Onset   Liver cancer Mother    Kidney cancer Mother    Lung cancer Mother    Heart disease Father    Hypertension Father    Cirrhosis Father    Macular degeneration  Father    Diabetes Brother    Heart attack Brother    Breast cancer Maternal Grandmother    Stroke Maternal Grandmother    Colon cancer Neg Hx    Esophageal cancer Neg Hx    Stomach cancer Neg Hx     Current Outpatient Medications (Endocrine & Metabolic):    levothyroxine (SYNTHROID, LEVOTHROID) 75 MCG tablet, Take 75 mcg by mouth at bedtime.    predniSONE (DELTASONE) 50 MG tablet, Take 1 tab 13 hours prior to your upcoming procedure Take 1 tab 7 hours prior to your upcoming procedure  Take 1 tab 1 hour prior to your procedure.  Current Outpatient Medications (Cardiovascular):    atenolol (TENORMIN) 50 MG tablet, Take 1 tablet by mouth Daily.   hydrochlorothiazide (MICROZIDE) 12.5 MG capsule, Take 12.5 mg by mouth daily.    losartan (COZAAR) 100 MG tablet, Take 100 mg by mouth daily.   Current Outpatient Medications (Analgesics):    acetaminophen (TYLENOL) 500 MG tablet, Take 1,000 mg by mouth 2 (two) times daily as needed for moderate pain or headache.    celecoxib (CELEBREX) 200 MG capsule, Take 200 mg by mouth daily.  Current Outpatient Medications (Hematological):    vitamin B-12 (CYANOCOBALAMIN) 1000 MCG tablet, Take 1,000 mcg by mouth daily.  Current Outpatient Medications (Other):    Calcium Carb-Cholecalciferol 500-600 MG-UNIT TABS, Take 1 tablet by mouth daily.   DULoxetine (CYMBALTA) 20 MG capsule, Take 2 capsules (40 mg total) by mouth daily.   gabapentin (NEURONTIN) 100 MG capsule, Take 2 capsules (200 mg total) by mouth at bedtime.   meclizine (ANTIVERT) 25 MG tablet, Take 1 tablet (25 mg total) by mouth 3 (three) times daily as needed for dizziness.   omeprazole (PRILOSEC) 20 MG capsule, Take 20 mg by mouth daily as needed (for heartburn).    Probiotic Product (PROBIOTIC PO), Take 1 capsule by mouth daily. Ultimate 16 strain w/ trace minerals & sos   tiZANidine (ZANAFLEX) 2 MG tablet, Take 1 tablet (2 mg total) by mouth at bedtime.   Vitamins A & D (VITAMIN A & D) 5000-400 UNITS CAPS, Take 1 capsule by mouth daily.   Reviewed prior external information including notes and imaging from  primary care provider As well as notes that were available from care everywhere and other healthcare systems.  Past medical history, social, surgical and family history all reviewed in electronic medical record.  No pertanent information unless stated regarding to the chief complaint.   Review of Systems:  No headache, visual changes, nausea, vomiting, diarrhea, constipation, dizziness, abdominal pain, skin rash, fevers, chills, night sweats, weight loss, swollen lymph nodes, body aches, joint swelling, chest pain, shortness of breath, mood changes. POSITIVE muscle aches  Objective  There were no vitals taken  for this visit.   General: No apparent distress alert and oriented x3 mood and affect normal, dressed appropriately.  HEENT: Pupils equal, extraocular movements intact  Respiratory: Patient's speak in full sentences and does not appear short of breath  Cardiovascular: No lower extremity edema, non tender, no erythema  Shoulder exam shows    Impression and Recommendations:    The above documentation has been reviewed and is accurate and complete Judi Saa, DO

## 2023-04-18 ENCOUNTER — Ambulatory Visit: Payer: Medicare Other | Admitting: Family Medicine

## 2023-04-18 ENCOUNTER — Other Ambulatory Visit: Payer: Self-pay

## 2023-04-18 VITALS — BP 118/72 | HR 80 | Ht 65.0 in | Wt 170.0 lb

## 2023-04-18 DIAGNOSIS — M1812 Unilateral primary osteoarthritis of first carpometacarpal joint, left hand: Secondary | ICD-10-CM

## 2023-04-18 DIAGNOSIS — M19012 Primary osteoarthritis, left shoulder: Secondary | ICD-10-CM | POA: Diagnosis not present

## 2023-04-18 DIAGNOSIS — M12812 Other specific arthropathies, not elsewhere classified, left shoulder: Secondary | ICD-10-CM

## 2023-04-18 NOTE — Patient Instructions (Signed)
Injections today See you again in 3-4 months

## 2023-04-19 ENCOUNTER — Encounter: Payer: Self-pay | Admitting: Family Medicine

## 2023-04-19 NOTE — Assessment & Plan Note (Signed)
Chronic problem with worsening symptoms.  Affecting daily activities.  Patient is to do bracing at night, and given injections today to help otherwise.  Follow-up again in 6 to 8 weeks.

## 2023-04-19 NOTE — Assessment & Plan Note (Signed)
Was making some improvement but worsening symptoms again.  Patient does have a known rotator cuff arthropathy.  Did have a small effusion noted on ultrasound today.  Follow-up with me again in 3 months after further evaluation.  Patient would be looking at possible surgical intervention which she would like to avoid completely.

## 2023-04-25 ENCOUNTER — Ambulatory Visit: Payer: Medicare Other

## 2023-05-15 ENCOUNTER — Ambulatory Visit
Admission: RE | Admit: 2023-05-15 | Discharge: 2023-05-15 | Disposition: A | Discharge status: Home / Self Care | Payer: Medicare Other | Source: Ambulatory Visit | Attending: Internal Medicine | Admitting: Internal Medicine

## 2023-05-15 DIAGNOSIS — Z0000 Encounter for general adult medical examination without abnormal findings: Secondary | ICD-10-CM

## 2023-05-15 DIAGNOSIS — Z1231 Encounter for screening mammogram for malignant neoplasm of breast: Secondary | ICD-10-CM | POA: Diagnosis not present

## 2023-07-12 NOTE — Progress Notes (Signed)
 [facility] [address] [place] [address] Phone: [phone] Subjective:   IBryan Caprio, am serving as a scribe for Dr. Ronnell Coins.  I'm seeing this patient by the request  of:  Azalia Leo, MD  CC: Left shoulder pain follow-up  [id number]  04/18/2023 Chronic problem with worsening symptoms.  Affecting daily activities.  Patient is to do bracing at night, and given injections today to help otherwise.  Follow-up again in 6 to 8 weeks     Was making some improvement but worsening symptoms again. Patient does have a known rotator cuff arthropathy. Did have a small effusion noted on ultrasound today. Follow-up with me again in 3 months after further evaluation. Patient would be looking at possible surgical intervention which she would like to avoid completely   Update 07/17/2023 Abigail Robinson is a 73 y.o. female coming in with complaint of L shoulder pain. Patient states was doing well until she fell about 2 weeks ago. Fell right onto shoulder. Aching pain on the front of lower leg on R side. Has been hurting for a while and getting worse. Keeping her up at night at this point.      Past Medical History:  Diagnosis Date   Allergy    Arthritis    back, hands   Basal cell carcinoma    face   Colon polyps    Complication of anesthesia    prolonged sedation, slow to wake up   Diplopia    Endometriosis    GERD (gastroesophageal reflux disease)    Headache    Migraines   Hyperlipidemia    Hypertension    Hypothyroidism    Skin cancer    hx of   Status post dilation of esophageal narrowing    Thalamic cyst    Thyroid  disease    Vertigo    Past Surgical History:  Procedure Laterality Date   ANTERIOR AND POSTERIOR REPAIR WITH SACROSPINOUS FIXATION N/A 07/08/2015   Procedure: ANTERIOR AND POSTERIOR REPAIR ;  Surgeon: Belle Box, MD;  Location: [facility];  Service: Gynecology;  Laterality: N/A;   COLONOSCOPY     DIAGNOSTIC  LAPAROSCOPY     LAPAROSCOPIC VAGINAL HYSTERECTOMY WITH SALPINGO OOPHORECTOMY Bilateral 07/08/2015   Procedure: LAPAROSCOPIC ASSISTED VAGINAL HYSTERECTOMY WITH BILATERAL SALPINGO OOPHORECTOMY;  Surgeon: Belle Box, MD;  Location: [facility];  Service: Gynecology;  Laterality: Bilateral;   OOPHORECTOMY  age 49   for endometrosis   Social History   Socioeconomic History   Marital status: Widowed    Spouse name: Not on file   Number of children: 0   Years of education: Not on file   Highest education level: Bachelor's degree (e.g., BA, AB, BS)  Occupational History    Comment: retired  Tobacco Use   Smoking status: Never   Smokeless tobacco: Never  Vaping Use   Vaping status: Never Used  Substance and Sexual Activity   Alcohol  use: Not Currently   Drug use: No   Sexual activity: Not on file  Other Topics Concern   Not on file  Social History Narrative   Lives alone   Social Drivers of Health   Financial Resource Strain: Not on file  Food Insecurity: Not on file  Transportation Needs: Not on file  Physical Activity: Not on file  Stress: Not on file  Social Connections: Not on file   Allergies  Allergen Reactions   Demerol [Meperidine] Nausea Only and Other (See Comments)  Reaction=headache   Family History  Problem Relation Age of Onset   Liver cancer Mother    Kidney cancer Mother    Lung cancer Mother    Heart disease Father    Hypertension Father    Cirrhosis Father    Macular degeneration Father    Diabetes Brother    Heart attack Brother    Breast cancer Maternal Grandmother    Stroke Maternal Grandmother    Colon cancer Neg Hx    Esophageal cancer Neg Hx    Stomach cancer Neg Hx     Current Outpatient Medications (Endocrine & Metabolic):    predniSONE  (DELTASONE ) 20 MG tablet, Take 1 tablet (20 mg total) by mouth daily with breakfast.   levothyroxine  (SYNTHROID , LEVOTHROID) 75 MCG tablet, Take 75 mcg by mouth at bedtime.    predniSONE  (DELTASONE ) 50 MG  tablet, Take 1 tab 13 hours prior to your upcoming procedure Take 1 tab 7 hours prior to your upcoming procedure  Take 1 tab 1 hour prior to your procedure.  Current Outpatient Medications (Cardiovascular):    atenolol  (TENORMIN ) 50 MG tablet, Take 1 tablet by mouth Daily.   hydrochlorothiazide (MICROZIDE) 12.5 MG capsule, Take 12.5 mg by mouth daily.   losartan  (COZAAR ) 100 MG tablet, Take 100 mg by mouth daily.   Current Outpatient Medications (Analgesics):    acetaminophen  (TYLENOL ) 500 MG tablet, Take 1,000 mg by mouth 2 (two) times daily as needed for moderate pain or headache.    celecoxib (CELEBREX) 200 MG capsule, Take 200 mg by mouth daily.  Current Outpatient Medications (Hematological):    vitamin B-12 (CYANOCOBALAMIN ) 1000 MCG tablet, Take 1,000 mcg by mouth daily.  Current Outpatient Medications (Other):    Calcium Carb-Cholecalciferol 500-600 MG-UNIT TABS, Take 1 tablet by mouth daily.   DULoxetine  (CYMBALTA ) 20 MG capsule, Take 2 capsules (40 mg total) by mouth daily.   gabapentin  (NEURONTIN ) 100 MG capsule, Take 2 capsules (200 mg total) by mouth at bedtime.   meclizine  (ANTIVERT ) 25 MG tablet, Take 1 tablet (25 mg total) by mouth 3 (three) times daily as needed for dizziness.   omeprazole (PRILOSEC) 20 MG capsule, Take 20 mg by mouth daily as needed (for heartburn).    Probiotic Product (PROBIOTIC PO), Take 1 capsule by mouth daily. Ultimate 16 strain w/ trace minerals & sos   tiZANidine  (ZANAFLEX ) 2 MG tablet, Take 1 tablet (2 mg total) by mouth at bedtime.   Vitamins A & D (VITAMIN A & D) 5000-400 UNITS CAPS, Take 1 capsule by mouth daily.   Reviewed prior external information including notes and imaging from  primary care provider As well as notes that were available from care everywhere and other healthcare systems.  Past medical history, social, surgical and family history all reviewed in electronic medical record.  No pertanent information unless stated regarding  to the chief complaint.   Review of Systems:  No headache, visual changes, nausea, vomiting, diarrhea, constipation, dizziness, abdominal pain, skin rash, fevers, chills, night sweats, weight loss, swollen lymph nodes, body aches, joint swelling, chest pain, shortness of breath, mood changes. POSITIVE muscle aches  Objective  Blood pressure 114/76, pulse 72, height 5\' 5"  (1.651 m), weight 173 lb (78.5 kg), SpO2 98%.   General: No apparent distress alert and oriented x3 mood and affect normal, dressed appropriately.  HEENT: Pupils equal, extraocular movements intact  Respiratory: Patient's speak in full sentences and does not appear short of breath  Cardiovascular: No lower extremity edema, non tender, no  erythema  Left shoulder exam shows positive impingement noted. Patient does have some limited range of motion especially with external range of motion. Neck exam does have some loss lordosis noted.  Some limited sidebending bilaterally.  No true radicular symptoms on exam with extension and compression versus   Impression and Recommendations:    The above documentation has been reviewed and is accurate and complete Jacqulyne Gladue M Anagha Loseke, DO

## 2023-07-17 ENCOUNTER — Telehealth: Payer: Self-pay | Admitting: Family Medicine

## 2023-07-17 ENCOUNTER — Other Ambulatory Visit: Payer: Self-pay

## 2023-07-17 ENCOUNTER — Ambulatory Visit (INDEPENDENT_AMBULATORY_CARE_PROVIDER_SITE_OTHER): Payer: Medicare Other | Admitting: Family Medicine

## 2023-07-17 ENCOUNTER — Encounter: Payer: Self-pay | Admitting: Family Medicine

## 2023-07-17 VITALS — BP 114/76 | HR 72 | Ht 65.0 in | Wt 173.0 lb

## 2023-07-17 DIAGNOSIS — M2550 Pain in unspecified joint: Secondary | ICD-10-CM

## 2023-07-17 DIAGNOSIS — M5030 Other cervical disc degeneration, unspecified cervical region: Secondary | ICD-10-CM

## 2023-07-17 DIAGNOSIS — S134XXA Sprain of ligaments of cervical spine, initial encounter: Secondary | ICD-10-CM | POA: Diagnosis not present

## 2023-07-17 DIAGNOSIS — M797 Fibromyalgia: Secondary | ICD-10-CM

## 2023-07-17 DIAGNOSIS — M12812 Other specific arthropathies, not elsewhere classified, left shoulder: Secondary | ICD-10-CM

## 2023-07-17 LAB — COMPREHENSIVE METABOLIC PANEL WITH GFR
ALT: 15 U/L (ref 0–35)
AST: 20 U/L (ref 0–37)
Albumin: 4.2 g/dL (ref 3.5–5.2)
Alkaline Phosphatase: 57 U/L (ref 39–117)
BUN: 15 mg/dL (ref 6–23)
CO2: 30 meq/L (ref 19–32)
Calcium: 10 mg/dL (ref 8.4–10.5)
Chloride: 105 meq/L (ref 96–112)
Creatinine, Ser: 0.86 mg/dL (ref 0.40–1.20)
GFR: 66.62 mL/min (ref >60.00)
Glucose, Bld: 104 mg/dL — ABNORMAL HIGH (ref 70–99)
Potassium: 4.3 meq/L (ref 3.5–5.1)
Sodium: 139 meq/L (ref 135–145)
Total Bilirubin: 0.4 mg/dL (ref 0.2–1.2)
Total Protein: 7.2 g/dL (ref 6.0–8.3)

## 2023-07-17 LAB — CBC WITH DIFFERENTIAL/PLATELET
Basophils Absolute: 0.1 10*3/uL (ref 0.0–0.1)
Basophils Relative: 0.8 % (ref 0.0–3.0)
Eosinophils Absolute: 0.4 10*3/uL (ref 0.0–0.7)
Eosinophils Relative: 4.9 % (ref 0.0–5.0)
HCT: 42.4 % (ref 36.0–46.0)
Hemoglobin: 14.2 g/dL (ref 12.0–15.0)
Lymphocytes Relative: 30.7 % (ref 12.0–46.0)
Lymphs Abs: 2.2 10*3/uL (ref 0.7–4.0)
MCHC: 33.5 g/dL (ref 30.0–36.0)
MCV: 94.5 fl (ref 78.0–100.0)
Monocytes Absolute: 0.5 10*3/uL (ref 0.1–1.0)
Monocytes Relative: 6.9 % (ref 3.0–12.0)
Neutro Abs: 4.1 10*3/uL (ref 1.4–7.7)
Neutrophils Relative %: 56.7 % (ref 43.0–77.0)
Platelets: 292 10*3/uL (ref 150.0–400.0)
RBC: 4.49 Mil/uL (ref 3.87–5.11)
RDW: 14 % (ref 11.5–15.5)
WBC: 7.3 10*3/uL (ref 4.0–10.5)

## 2023-07-17 LAB — FERRITIN: Ferritin: 31.9 ng/mL (ref 10.0–291.0)

## 2023-07-17 LAB — IBC PANEL
Iron: 192 ug/dL — ABNORMAL HIGH (ref 42–145)
Saturation Ratios: 63.5 % — ABNORMAL HIGH (ref 20.0–50.0)
TIBC: 302.4 ug/dL (ref 250.0–450.0)
Transferrin: 216 mg/dL (ref 212.0–360.0)

## 2023-07-17 LAB — TSH: TSH: 3.56 u[IU]/mL (ref 0.35–5.50)

## 2023-07-17 MED ORDER — DULOXETINE HCL 20 MG PO CPEP: 40.0000 mg | ORAL_CAPSULE | Freq: Every day | ORAL | 0 refills | Status: DC

## 2023-07-17 MED ORDER — PREDNISONE 20 MG PO TABS: 20.0000 mg | ORAL_TABLET | Freq: Every day | ORAL | 0 refills | Status: DC

## 2023-07-17 NOTE — Assessment & Plan Note (Signed)
 Does have rotator cuff arthropathy noted.  We will monitor closely.  Could do a repeat injection if needed.  We discussed icing regimen.  Discussed formal physical therapy.  Prednisone  given for her trip where patient is going to be doing a car ride for a longer duration.  Follow-up with me again in 6 to 8 weeks otherwise.  Differential includes degenerative disc disease and I do think patient has more of a whiplash syndrome as well.

## 2023-07-17 NOTE — Patient Instructions (Addendum)
 PT  with Daina Drum Prednisone  20mg  7 days for trip Labs today Let's see you after trip

## 2023-07-17 NOTE — Assessment & Plan Note (Addendum)
 I believe the patient is having an exacerbation of her underlying condition.  And seems to have a even more of a whiplash component.  Patient will start formal physical therapy.  Is simply doing a 2-week road trip coming up.  I think that this could be causing some exacerbation as well.  Discussed prednisone  and given for the ride if necessary.  Discussed icing regimen of home exercises, discussed which activities to do and which ones to avoid.  Increase activity slowly otherwise.  Follow-up again in 6 to 8 weeks otherwise.  Laboratory workup noted

## 2023-07-17 NOTE — Telephone Encounter (Signed)
 Patient asked if Dr.Smith could refill Duloxetine .

## 2023-07-21 ENCOUNTER — Other Ambulatory Visit: Payer: Self-pay | Admitting: Family Medicine

## 2023-07-21 LAB — PTH, INTACT AND CALCIUM
Calcium: 8.2 mg/dL — ABNORMAL LOW (ref 8.6–10.4)
PTH: 34 pg/mL (ref 16–77)

## 2023-07-21 LAB — D-DIMER, QUANTITATIVE: D-Dimer, Quant: 0.58 ug{FEU}/mL — ABNORMAL HIGH (ref <0.50)

## 2023-07-22 ENCOUNTER — Encounter: Payer: Self-pay | Admitting: Family Medicine

## 2023-07-22 MED ORDER — DULOXETINE HCL 20 MG PO CPEP: 40.0000 mg | ORAL_CAPSULE | Freq: Every day | ORAL | 0 refills | Status: DC

## 2023-07-23 DIAGNOSIS — R0981 Nasal congestion: Secondary | ICD-10-CM | POA: Diagnosis not present

## 2023-07-23 DIAGNOSIS — R051 Acute cough: Secondary | ICD-10-CM | POA: Diagnosis not present

## 2023-07-23 DIAGNOSIS — J069 Acute upper respiratory infection, unspecified: Secondary | ICD-10-CM | POA: Diagnosis not present

## 2023-07-25 ENCOUNTER — Encounter: Payer: Self-pay | Admitting: Physical Therapy

## 2023-07-25 ENCOUNTER — Ambulatory Visit: Admitting: Physical Therapy

## 2023-07-25 ENCOUNTER — Other Ambulatory Visit: Payer: Self-pay

## 2023-07-25 DIAGNOSIS — M6281 Muscle weakness (generalized): Secondary | ICD-10-CM

## 2023-07-25 DIAGNOSIS — M542 Cervicalgia: Secondary | ICD-10-CM

## 2023-07-25 DIAGNOSIS — G8929 Other chronic pain: Secondary | ICD-10-CM

## 2023-07-25 DIAGNOSIS — M25512 Pain in left shoulder: Secondary | ICD-10-CM | POA: Diagnosis not present

## 2023-07-25 NOTE — Therapy (Unsigned)
 OUTPATIENT PHYSICAL THERAPY EVALUATION   Patient Name: Abigail Robinson MRN: [id number] DOB:Jun 24, 1950, 73 y.o., female Today's Date: 07/26/2023   END OF SESSION:  PT End of Session - 07/25/23 1538     Visit Number 1    Number of Visits 9    Date for PT Re-Evaluation 09/19/23    Authorization Type MCR    Progress Note Due on Visit 10    PT Start Time 1530    PT Stop Time 1600    PT Time Calculation (min) 30 min    Activity Tolerance Patient tolerated treatment well    Behavior During Therapy WFL for tasks assessed/performed             Past Medical History:  Diagnosis Date   Allergy    Arthritis    back, hands   Basal cell carcinoma    face   Colon polyps    Complication of anesthesia    prolonged sedation, slow to wake up   Diplopia    Endometriosis    GERD (gastroesophageal reflux disease)    Headache    Migraines   Hyperlipidemia    Hypertension    Hypothyroidism    Skin cancer    hx of   Status post dilation of esophageal narrowing    Thalamic cyst    Thyroid  disease    Vertigo    Past Surgical History:  Procedure Laterality Date   ANTERIOR AND POSTERIOR REPAIR WITH SACROSPINOUS FIXATION N/A 07/08/2015   Procedure: ANTERIOR AND POSTERIOR REPAIR ;  Surgeon: Belle Box, MD;  Location: [facility];  Service: Gynecology;  Laterality: N/A;   COLONOSCOPY     DIAGNOSTIC LAPAROSCOPY     LAPAROSCOPIC VAGINAL HYSTERECTOMY WITH SALPINGO OOPHORECTOMY Bilateral 07/08/2015   Procedure: LAPAROSCOPIC ASSISTED VAGINAL HYSTERECTOMY WITH BILATERAL SALPINGO OOPHORECTOMY;  Surgeon: Belle Box, MD;  Location: [facility];  Service: Gynecology;  Laterality: Bilateral;   OOPHORECTOMY  age 30   for endometrosis   Patient Active Problem List   Diagnosis Date Noted   Greater trochanteric bursitis of right hip 11/14/2022   Arthritis of carpometacarpal [facility]) joint of left thumb 11/14/2022   Patellofemoral arthritis of right knee 09/18/2022   Rotator cuff arthropathy of left shoulder  05/18/2022   AC (acromioclavicular) arthritis 05/18/2022   Double vision 01/04/2022   Degenerative disc disease, lumbar 08/31/2020   Degenerative cervical disc 02/16/2020   S/P laparoscopic assisted vaginal hysterectomy (LAVH) 07/08/2015    PCP: Azalia Leo, MD  REFERRING PROVIDER: Isidro Margo, DO  REFERRING DIAG: Rotator cuff arthropathy of left shoulder; Whiplash injury to neck, initial encounter  THERAPY DIAG:  Cervicalgia  Chronic left shoulder pain  Muscle weakness (generalized)  Rationale for Evaluation and Treatment: Rehabilitation  ONSET DATE: Chronic   SUBJECTIVE:        SUBJECTIVE STATEMENT: Patient reports she fell about 3 weeks ago and she is having some neck tightness mainly on the left side, and left shoulder pain. She was walking a dog that pulled her down. She has difficulty trying to do her hair for extended periods of time, and she trouble reaching backward with the left arm. She has been doing some left shoulder exercises such as upper trap stretch, wall slide, and banded row. States occasionally have pain that radiates down the left arm but pain mainly is up in the shoulder. She denies any numbness or tingling. She does endorse clicking and popping in her shoulders. She is able to sleep pretty well without being  woken by her neck or shoulder pain.  Hand dominance: Right  PERTINENT HISTORY:  See PMH above  PAIN:  Are you having pain? Yes:  NPRS scale: 0/10 at rest, 6-7/10 at worst Pain location: Neck and left shoulder Pain description: Tight Aggravating factors: Turning neck, reaching overhead with left arm, reach back with left arm Relieving factors: Rest  PRECAUTIONS: Fall  RED FLAGS: None    WEIGHT BEARING RESTRICTIONS: No  FALLS:  Has patient fallen in last 6 months? Yes. Number of falls 1  PLOF: Independent  PATIENT GOALS: Reduce neck tightness   OBJECTIVE:  Note: Objective measures were completed at Evaluation unless otherwise  noted. PATIENT SURVEYS:  NDI 9/50 (18% disability)  COGNITION: Overall cognitive status: Within functional limits for tasks assessed  SENSATION: WFL  POSTURE:   Rounded shoulder and forward head posture  PALPATION: Tender to palpation bilateral upper trap and cervical paraspinals   CERVICAL ROM:   Active ROM A/PROM (deg) eval  Flexion   Extension   Right lateral flexion   Left lateral flexion   Right rotation 45  Left rotation 40   (Blank rows = not tested)  UPPER EXTREMITY ROM:  Active ROM Right eval Left eval  Shoulder flexion 140 120  Shoulder extension    Shoulder abduction    Shoulder adduction    Shoulder extension    Shoulder internal rotation    Shoulder external rotation Reach to C7 Reach to occiput  Elbow flexion    Elbow extension    Wrist flexion    Wrist extension    Wrist ulnar deviation    Wrist radial deviation    Wrist pronation    Wrist supination     (Blank rows = not tested)  UPPER EXTREMITY MMT:  MMT Right eval Left eval  Shoulder flexion 4 4-  Shoulder extension    Shoulder abduction    Shoulder adduction    Shoulder extension    Shoulder internal rotation    Shoulder external rotation 4 4-  Middle trapezius    Lower trapezius    Elbow flexion    Elbow extension    Wrist flexion    Wrist extension    Wrist ulnar deviation    Wrist radial deviation    Wrist pronation    Wrist supination    Grip strength     (Blank rows = not tested)  CERVICAL SPECIAL TESTS:  Cervical radicular testing negative  FUNCTIONAL TESTS:  DNF endurance: 3 seconds   TREATMENT [facility] Adult PT Treatment:                                                DATE: 07/25/2023 Row with yellow x 10 Shoulder ER with yellow x 10 Seated cervical retraction 10 x 5 sec  PATIENT EDUCATION:  Education details: Exam findings, POC, HEP Person educated: Patient Education method: Programmer, multimedia, Demonstration, Tactile cues, Verbal cues, and Handouts Education  comprehension: verbalized understanding, returned demonstration, verbal cues required, tactile cues required, and needs further education  HOME EXERCISE PROGRAM: Access Code: [email or web address]    ASSESSMENT: CLINICAL IMPRESSION: Patient is a 73 y.o. female who was seen today for physical therapy evaluation and treatment for acute on chronic neck tightness and chronic left shoulder pain. She does exhibit decreased cervical rotation bilaterally with muscular tightness and cervical hypomobility. She also exhibits limitations with left  shoulder motion and rotator cuff strength deficit.   OBJECTIVE IMPAIRMENTS: decreased activity tolerance, decreased ROM, decreased strength, impaired flexibility, postural dysfunction, and pain.   ACTIVITY LIMITATIONS: carrying, lifting, bathing, dressing, and reach over head  PARTICIPATION LIMITATIONS: meal prep, cleaning, driving, shopping, and community activity  PERSONAL FACTORS: Fitness, Past/current experiences, and Time since onset of injury/illness/exacerbation are also affecting patient's functional outcome.   REHAB POTENTIAL: Good  CLINICAL DECISION MAKING: Stable/uncomplicated  EVALUATION COMPLEXITY: Low   GOALS: Goals reviewed with patient? Yes  SHORT TERM GOALS: Target date: 08/22/2023  Patient will be I with initial HEP in order to progress with therapy. Baseline: HEP provided at eval Goal status: INITIAL  2.  Patient will report neck and left shoulder pain </= 4/10 in order to reduce functional limitations Baseline: 6-7/10 Goal status: INITIAL  LONG TERM GOALS: Target date: 09/19/2023  Patient will be I with final HEP to maintain progress from PT. Baseline: HEP provided at eval Goal status: INITIAL  2. Patient will report NDI </= 3/50 (6% disability) in order to indicate an improvement in their functional status Baseline: 9/50 (18% disability) Goal status: INITIAL  3.  Patient will demonstrate cervical rotation >/= 60 deg bilaterally to  reduce neck tightness and improve driving Baseline: see limitations above Goal status: INITIAL  4.  Patient will exhibit left shoulder elevation >/= 140 deg to improve overhead reach and ability to perform self care tasks Baseline: 120 deg Goal status: INITIAL   PLAN: PT FREQUENCY: 1-2x/week  PT DURATION: 8 weeks  PLANNED INTERVENTIONS: 97164- PT Re-evaluation, 97110-Therapeutic exercises, 97530- Therapeutic activity, 97112- Neuromuscular re-education, 97535- Self Care, 16109- Manual therapy, Patient/Family education, Balance training, Dry Needling, Joint mobilization, Joint manipulation, Spinal manipulation, Spinal mobilization, Cryotherapy, and Moist heat  PLAN FOR NEXT SESSION: Review HEP and progress PRN, manual/TPDN for the cervical region, left shoulder mobs/PROM, progress postural control and rotator cuff strengthening    Leah Primus, PT, DPT, LAT, ATC 07/26/23  2:45 PM Phone: [phone] Fax: [phone]

## 2023-07-25 NOTE — Patient Instructions (Signed)
 Access Code: [email or web address] URL: [email or web address] Date: 07/25/2023 Prepared by: Leah Primus  Exercises - Standing Row with Anchored Resistance  - 1 x daily - 2 sets - 10 reps - Shoulder External Rotation with Anchored Resistance  - 1 x daily - 2 sets - 10 reps - Seated Neck Retraction  - 1 x daily - 2 sets - 10 reps - 5 seconds hold

## 2023-07-30 ENCOUNTER — Other Ambulatory Visit: Payer: Self-pay

## 2023-07-30 ENCOUNTER — Encounter: Payer: Self-pay | Admitting: Physical Therapy

## 2023-07-30 ENCOUNTER — Ambulatory Visit (INDEPENDENT_AMBULATORY_CARE_PROVIDER_SITE_OTHER): Admitting: Physical Therapy

## 2023-07-30 DIAGNOSIS — M6281 Muscle weakness (generalized): Secondary | ICD-10-CM | POA: Diagnosis not present

## 2023-07-30 DIAGNOSIS — M25512 Pain in left shoulder: Secondary | ICD-10-CM | POA: Diagnosis not present

## 2023-07-30 DIAGNOSIS — M542 Cervicalgia: Secondary | ICD-10-CM | POA: Diagnosis not present

## 2023-07-30 DIAGNOSIS — G8929 Other chronic pain: Secondary | ICD-10-CM | POA: Diagnosis not present

## 2023-07-30 NOTE — Therapy (Signed)
 OUTPATIENT PHYSICAL THERAPY TREATMENT   Patient Name: Abigail Robinson MRN: [id number] DOB:[birth year], 73 y.o., female Today's Date: 07/30/2023   END OF SESSION:  PT End of Session - 07/30/23 1448     Visit Number 2    Number of Visits 9    Date for PT Re-Evaluation 09/19/23    Authorization Type MCR    Progress Note Due on Visit 10    PT Start Time 1355    PT Stop Time 1437    PT Time Calculation (min) 42 min    Activity Tolerance Patient tolerated treatment well    Behavior During Therapy WFL for tasks assessed/performed              Past Medical History:  Diagnosis Date   Allergy    Arthritis    back, hands   Basal cell carcinoma    face   Colon polyps    Complication of anesthesia    prolonged sedation, slow to wake up   Diplopia    Endometriosis    GERD (gastroesophageal reflux disease)    Headache    Migraines   Hyperlipidemia    Hypertension    Hypothyroidism    Skin cancer    hx of   Status post dilation of esophageal narrowing    Thalamic cyst    Thyroid  disease    Vertigo    Past Surgical History:  Procedure Laterality Date   ANTERIOR AND POSTERIOR REPAIR WITH SACROSPINOUS FIXATION N/A 07/08/2015   Procedure: ANTERIOR AND POSTERIOR REPAIR ;  Surgeon: Belle Box, MD;  Location: [facility];  Service: Gynecology;  Laterality: N/A;   COLONOSCOPY     DIAGNOSTIC LAPAROSCOPY     LAPAROSCOPIC VAGINAL HYSTERECTOMY WITH SALPINGO OOPHORECTOMY Bilateral 07/08/2015   Procedure: LAPAROSCOPIC ASSISTED VAGINAL HYSTERECTOMY WITH BILATERAL SALPINGO OOPHORECTOMY;  Surgeon: Belle Box, MD;  Location: [facility];  Service: Gynecology;  Laterality: Bilateral;   OOPHORECTOMY  age 79   for endometrosis   Patient Active Problem List   Diagnosis Date Noted   Greater trochanteric bursitis of right hip 11/14/2022   Arthritis of carpometacarpal [facility]) joint of left thumb 11/14/2022   Patellofemoral arthritis of right knee 09/18/2022   Rotator cuff arthropathy of left shoulder  05/18/2022   AC (acromioclavicular) arthritis 05/18/2022   Double vision 01/04/2022   Degenerative disc disease, lumbar 08/31/2020   Degenerative cervical disc 02/16/2020   S/P laparoscopic assisted vaginal hysterectomy (LAVH) 07/08/2015    PCP: Azalia Leo, MD  REFERRING PROVIDER: Isidro Margo, DO  REFERRING DIAG: Rotator cuff arthropathy of left shoulder; Whiplash injury to neck, initial encounter  THERAPY DIAG:  Cervicalgia  Chronic left shoulder pain  Muscle weakness (generalized)  Rationale for Evaluation and Treatment: Rehabilitation  ONSET DATE: Chronic   SUBJECTIVE:        SUBJECTIVE STATEMENT: Patient reports she took a tumble on the left arm when she was pulling brush and her feet got tangled up. Her neck and left shoulder aren't feeling too bad.   Eval: Patient reports she fell about 3 weeks ago and she is having some neck tightness mainly on the left side, and left shoulder pain. She was walking a dog that pulled her down. She has difficulty trying to do her hair for extended periods of time, and she trouble reaching backward with the left arm. She has been doing some left shoulder exercises such as upper trap stretch, wall slide, and banded row. States occasionally have pain that radiates down the  left arm but pain mainly is up in the shoulder. She denies any numbness or tingling. She does endorse clicking and popping in her shoulders. She is able to sleep pretty well without being woken by her neck or shoulder pain.  Hand dominance: Right  PERTINENT HISTORY:  See PMH above  PAIN:  Are you having pain? Yes:  NPRS scale: 0/10 at rest, 6-7/10 at worst Pain location: Neck and left shoulder Pain description: Tight Aggravating factors: Turning neck, reaching overhead with left arm, reach back with left arm Relieving factors: Rest  PRECAUTIONS: Fall  RED FLAGS: None    WEIGHT BEARING RESTRICTIONS: No  FALLS:  Has patient fallen in last 6 months? Yes.  Number of falls 1  PLOF: Independent  PATIENT GOALS: Reduce neck tightness   OBJECTIVE:  Note: Objective measures were completed at Evaluation unless otherwise noted. PATIENT SURVEYS:  NDI 9/50 (18% disability)  POSTURE:   Rounded shoulder and forward head posture  PALPATION: Tender to palpation bilateral upper trap and cervical paraspinals   CERVICAL ROM:   Active ROM A/PROM (deg) eval   07/30/2023  Flexion    Extension    Right lateral flexion    Left lateral flexion    Right rotation 45 50  Left rotation 40 55   (Blank rows = not tested)  UPPER EXTREMITY ROM:  Active ROM Right eval Left eval Left 07/30/2023  Shoulder flexion 140 120 130  Shoulder extension     Shoulder abduction     Shoulder adduction     Shoulder extension     Shoulder internal rotation     Shoulder external rotation Reach to C7 Reach to occiput   Elbow flexion     Elbow extension     Wrist flexion     Wrist extension     Wrist ulnar deviation     Wrist radial deviation     Wrist pronation     Wrist supination      (Blank rows = not tested)  UPPER EXTREMITY MMT:  MMT Right eval Left eval  Shoulder flexion 4 4-  Shoulder extension    Shoulder abduction    Shoulder adduction    Shoulder extension    Shoulder internal rotation    Shoulder external rotation 4 4-  Middle trapezius    Lower trapezius    Elbow flexion    Elbow extension    Wrist flexion    Wrist extension    Wrist ulnar deviation    Wrist radial deviation    Wrist pronation    Wrist supination    Grip strength     (Blank rows = not tested)  CERVICAL SPECIAL TESTS:  Cervical radicular testing negative  FUNCTIONAL TESTS:  DNF endurance: 3 seconds   TREATMENT [facility] Adult PT Treatment:                                                DATE: 07/30/2023 UBE L1 x 4 min (fwd/bwd) to improve endurance and workload capacity Left shoulder GHJ mobs primarily inferior and posterior at various ranges of  elevation Left shoulder PROM all directions Seated upper trap stretch 3 x 15 sec each Wall slide for shoulder flexion 10 x 5 sec Doorway pec stretch 3 x 15 sec for left Seated cervical retraction 10 x 5 sec Review previously provided HEP for banded  exercises  Trigger Point Dry Needling  Initial Treatment: Pt instructed on Dry Needling rational, procedures, and possible side effects. Pt instructed to expect mild to moderate muscle soreness later in the day and/or into the next day.  Pt instructed in methods to reduce muscle soreness. Pt instructed to continue prescribed HEP. Because Dry Needling was performed over or adjacent to a lung field, pt was educated on S/S of pneumothorax and to seek immediate medical attention should they occur.  Patient was educated on signs and symptoms of infection and other risk factors and advised to seek medical attention should they occur.  Patient verbalized understanding of these instructions and education.   Patient Verbal Consent Given: Yes Education Handout Provided: Yes Muscles Treated: Bilateral upper trap Electrical Stimulation Performed: No Treatment Response/Outcome: Twitch response   PATIENT EDUCATION:  Education details: HEP update, TPDN Person educated: Patient Education method: Explanation, Demonstration, Tactile cues, Verbal cues, and Handouts Education comprehension: verbalized understanding, returned demonstration, verbal cues required, tactile cues required, and needs further education  HOME EXERCISE PROGRAM: Access Code: [email or web address]    ASSESSMENT: CLINICAL IMPRESSION: Patient tolerated therapy well with no adverse effects. Performed TPDN for bilateral upper traps with good therapeutic benefit. Therapy focused on improving left shoulder mobility and reducing cervical tension with good tolerance. She does exhibit improvement in her cervical rotation and left shoulder elevation range of motion this visit. She does require occasional  cueing for proper exercise technique and posture. Updated HEP to include some neck and shoulder stretches. Patient would benefit from continued skilled PT to progress mobility and strength in order to reduce pain and maximize functional ability.   Eval: Patient is a 73 y.o. female who was seen today for physical therapy evaluation and treatment for acute on chronic neck tightness and chronic left shoulder pain. She does exhibit decreased cervical rotation bilaterally with muscular tightness and cervical hypomobility. She also exhibits limitations with left shoulder motion and rotator cuff strength deficit.   OBJECTIVE IMPAIRMENTS: decreased activity tolerance, decreased ROM, decreased strength, impaired flexibility, postural dysfunction, and pain.   ACTIVITY LIMITATIONS: carrying, lifting, bathing, dressing, and reach over head  PARTICIPATION LIMITATIONS: meal prep, cleaning, driving, shopping, and community activity  PERSONAL FACTORS: Fitness, Past/current experiences, and Time since onset of injury/illness/exacerbation are also affecting patient's functional outcome.    GOALS: Goals reviewed with patient? Yes  SHORT TERM GOALS: Target date: 08/22/2023  Patient will be I with initial HEP in order to progress with therapy. Baseline: HEP provided at eval Goal status: INITIAL  2.  Patient will report neck and left shoulder pain </= 4/10 in order to reduce functional limitations Baseline: 6-7/10 Goal status: INITIAL  LONG TERM GOALS: Target date: 09/19/2023  Patient will be I with final HEP to maintain progress from PT. Baseline: HEP provided at eval Goal status: INITIAL  2. Patient will report NDI </= 3/50 (6% disability) in order to indicate an improvement in their functional status Baseline: 9/50 (18% disability) Goal status: INITIAL  3.  Patient will demonstrate cervical rotation >/= 60 deg bilaterally to reduce neck tightness and improve driving Baseline: see limitations  above Goal status: INITIAL  4.  Patient will exhibit left shoulder elevation >/= 140 deg to improve overhead reach and ability to perform self care tasks Baseline: 120 deg Goal status: INITIAL   PLAN: PT FREQUENCY: 1-2x/week  PT DURATION: 8 weeks  PLANNED INTERVENTIONS: 97164- PT Re-evaluation, 97110-Therapeutic exercises, 97530- Therapeutic activity, [id number]- Neuromuscular re-education, 97535- Self Care, 98119- Manual therapy, Patient/Family education,  Balance training, Dry Needling, Joint mobilization, Joint manipulation, Spinal manipulation, Spinal mobilization, Cryotherapy, and Moist heat  PLAN FOR NEXT SESSION: Review HEP and progress PRN, manual/TPDN for the cervical region, left shoulder mobs/PROM, progress postural control and rotator cuff strengthening    Leah Primus, PT, DPT, LAT, ATC 07/30/23  2:53 PM Phone: [phone] Fax: [phone]

## 2023-07-30 NOTE — Patient Instructions (Signed)
 Access Code: [email or web address] URL: [email or web address] Date: 07/30/2023 Prepared by: Leah Primus  Exercises - Seated Cervical Sidebending Stretch  - 1 x daily - 3 reps - 15 seconds hold - Standing shoulder flexion wall slides  - 1 x daily - 2 sets - 10 reps - 5 seconds hold - Single Arm Doorway Pec Stretch at 90 Degrees Abduction  - 1 x daily - 3 reps - 15 seconds hold - Standing Row with Anchored Resistance  - 1 x daily - 2 sets - 10 reps - Shoulder External Rotation with Anchored Resistance  - 1 x daily - 2 sets - 10 reps - Seated Neck Retraction  - 1 x daily - 2 sets - 10 reps - 5 seconds hold

## 2023-08-06 ENCOUNTER — Encounter: Payer: Self-pay | Admitting: Physical Therapy

## 2023-08-06 ENCOUNTER — Other Ambulatory Visit: Payer: Self-pay

## 2023-08-06 ENCOUNTER — Ambulatory Visit: Admitting: Physical Therapy

## 2023-08-06 DIAGNOSIS — G8929 Other chronic pain: Secondary | ICD-10-CM

## 2023-08-06 DIAGNOSIS — M6281 Muscle weakness (generalized): Secondary | ICD-10-CM

## 2023-08-06 DIAGNOSIS — M542 Cervicalgia: Secondary | ICD-10-CM | POA: Diagnosis not present

## 2023-08-06 DIAGNOSIS — M25512 Pain in left shoulder: Secondary | ICD-10-CM

## 2023-08-06 NOTE — Therapy (Signed)
 OUTPATIENT PHYSICAL THERAPY TREATMENT   Patient Name: Abigail Robinson MRN: [id number] DOB:December 28, 1950, 73 y.o., female Today's Date: 08/06/2023   END OF SESSION:  PT End of Session - 08/06/23 1423     Visit Number 3    Number of Visits 9    Date for PT Re-Evaluation 09/19/23    Authorization Type MCR    Progress Note Due on Visit 10    PT Start Time 1346    PT Stop Time 1428    PT Time Calculation (min) 42 min    Activity Tolerance Patient tolerated treatment well    Behavior During Therapy WFL for tasks assessed/performed               Past Medical History:  Diagnosis Date   Allergy    Arthritis    back, hands   Basal cell carcinoma    face   Colon polyps    Complication of anesthesia    prolonged sedation, slow to wake up   Diplopia    Endometriosis    GERD (gastroesophageal reflux disease)    Headache    Migraines   Hyperlipidemia    Hypertension    Hypothyroidism    Skin cancer    hx of   Status post dilation of esophageal narrowing    Thalamic cyst    Thyroid  disease    Vertigo    Past Surgical History:  Procedure Laterality Date   ANTERIOR AND POSTERIOR REPAIR WITH SACROSPINOUS FIXATION N/A 07/08/2015   Procedure: ANTERIOR AND POSTERIOR REPAIR ;  Surgeon: Belle Box, MD;  Location: [facility];  Service: Gynecology;  Laterality: N/A;   COLONOSCOPY     DIAGNOSTIC LAPAROSCOPY     LAPAROSCOPIC VAGINAL HYSTERECTOMY WITH SALPINGO OOPHORECTOMY Bilateral 07/08/2015   Procedure: LAPAROSCOPIC ASSISTED VAGINAL HYSTERECTOMY WITH BILATERAL SALPINGO OOPHORECTOMY;  Surgeon: Belle Box, MD;  Location: [facility];  Service: Gynecology;  Laterality: Bilateral;   OOPHORECTOMY  age 52   for endometrosis   Patient Active Problem List   Diagnosis Date Noted   Greater trochanteric bursitis of right hip 11/14/2022   Arthritis of carpometacarpal [facility]) joint of left thumb 11/14/2022   Patellofemoral arthritis of right knee 09/18/2022   Rotator cuff arthropathy of left shoulder  05/18/2022   AC (acromioclavicular) arthritis 05/18/2022   Double vision 01/04/2022   Degenerative disc disease, lumbar 08/31/2020   Degenerative cervical disc 02/16/2020   S/P laparoscopic assisted vaginal hysterectomy (LAVH) 07/08/2015    PCP: Azalia Leo, MD  REFERRING PROVIDER: Isidro Margo, DO  REFERRING DIAG: Rotator cuff arthropathy of left shoulder; Whiplash injury to neck, initial encounter  THERAPY DIAG:  Cervicalgia  Chronic left shoulder pain  Muscle weakness (generalized)  Rationale for Evaluation and Treatment: Rehabilitation  ONSET DATE: Chronic   SUBJECTIVE:        SUBJECTIVE STATEMENT: Patient reports she is doing well. The needling seemed to help after last visit but her muscles did tighten back up.  Eval: Patient reports she fell about 3 weeks ago and she is having some neck tightness mainly on the left side, and left shoulder pain. She was walking a dog that pulled her down. She has difficulty trying to do her hair for extended periods of time, and she trouble reaching backward with the left arm. She has been doing some left shoulder exercises such as upper trap stretch, wall slide, and banded row. States occasionally have pain that radiates down the left arm but pain mainly is up in the  shoulder. She denies any numbness or tingling. She does endorse clicking and popping in her shoulders. She is able to sleep pretty well without being woken by her neck or shoulder pain.  Hand dominance: Right  PERTINENT HISTORY:  See PMH above  PAIN:  Are you having pain? Yes:  NPRS scale: 0/10 at rest, 6-7/10 at worst Pain location: Neck and left shoulder Pain description: Tight Aggravating factors: Turning neck, reaching overhead with left arm, reach back with left arm Relieving factors: Rest  PRECAUTIONS: Fall  PATIENT GOALS: Reduce neck tightness   OBJECTIVE:  Note: Objective measures were completed at Evaluation unless otherwise noted. PATIENT  SURVEYS:  NDI 9/50 (18% disability)  POSTURE:   Rounded shoulder and forward head posture  PALPATION: Tender to palpation bilateral upper trap and cervical paraspinals   CERVICAL ROM:   Active ROM A/PROM (deg) eval   07/30/2023  Flexion    Extension    Right lateral flexion    Left lateral flexion    Right rotation 45 50  Left rotation 40 55   (Blank rows = not tested)  UPPER EXTREMITY ROM:  Active ROM Right eval Left eval Left 07/30/2023  Shoulder flexion 140 120 130  Shoulder extension     Shoulder abduction     Shoulder adduction     Shoulder extension     Shoulder internal rotation     Shoulder external rotation Reach to C7 Reach to occiput   Elbow flexion     Elbow extension     Wrist flexion     Wrist extension     Wrist ulnar deviation     Wrist radial deviation     Wrist pronation     Wrist supination      (Blank rows = not tested)  UPPER EXTREMITY MMT:  MMT Right eval Left eval  Shoulder flexion 4 4-  Shoulder extension    Shoulder abduction    Shoulder adduction    Shoulder extension    Shoulder internal rotation    Shoulder external rotation 4 4-  Middle trapezius    Lower trapezius    Elbow flexion    Elbow extension    Wrist flexion    Wrist extension    Wrist ulnar deviation    Wrist radial deviation    Wrist pronation    Wrist supination    Grip strength     (Blank rows = not tested)  CERVICAL SPECIAL TESTS:  Cervical radicular testing negative  FUNCTIONAL TESTS:  DNF endurance: 3 seconds   TREATMENT [facility] Adult PT Treatment:                                                DATE: 08/06/2023 UBE L1 x 4 min (fwd/bwd) to improve endurance and workload capacity Seated upper trap stretch x 2 each Wall slide for shoulder flexion 10 x 5 sec Row with green 2 x 10 Shoulder ER with yellow 2 x 10 each Seated cervical retraction 2 x 10 x 5 sec Seated horizontal abduction with yellow 2 x 10  Trigger Point Dry Needling  Subsequent  Treatment: Instructions provided previously at initial dry needling treatment.  Instructions reviewed, if requested by the patient, prior to subsequent dry needling treatment.   Patient Verbal Consent Given: Yes Education Handout Provided: Yes Muscles Treated: Bilateral upper trap Electrical Stimulation Performed:  No Treatment Response/Outcome: Twitch response   PATIENT EDUCATION:  Education details: HEP update, TPDN Person educated: Patient Education method: Explanation, Demonstration, Tactile cues, Verbal cues, and Handouts Education comprehension: verbalized understanding, returned demonstration, verbal cues required, tactile cues required, and needs further education  HOME EXERCISE PROGRAM: Access Code: [email or web address]    ASSESSMENT: CLINICAL IMPRESSION: Patient tolerated therapy well with no adverse effects. Performed TPDN for bilateral upper traps with good therapeutic benefit and patient report reduction in neck tightness. Therapy focused on improving left shoulder mobility and progressing her rotator cuff and periscapular strengthening. Progressed her banded shoulder exercises for home with good tolerance. Patient would benefit from continued skilled PT to progress mobility and strength in order to reduce pain and maximize functional ability.   Eval: Patient is a 73 y.o. female who was seen today for physical therapy evaluation and treatment for acute on chronic neck tightness and chronic left shoulder pain. She does exhibit decreased cervical rotation bilaterally with muscular tightness and cervical hypomobility. She also exhibits limitations with left shoulder motion and rotator cuff strength deficit.   OBJECTIVE IMPAIRMENTS: decreased activity tolerance, decreased ROM, decreased strength, impaired flexibility, postural dysfunction, and pain.   ACTIVITY LIMITATIONS: carrying, lifting, bathing, dressing, and reach over head  PARTICIPATION LIMITATIONS: meal prep, cleaning, driving,  shopping, and community activity  PERSONAL FACTORS: Fitness, Past/current experiences, and Time since onset of injury/illness/exacerbation are also affecting patient's functional outcome.    GOALS: Goals reviewed with patient? Yes  SHORT TERM GOALS: Target date: 08/22/2023  Patient will be I with initial HEP in order to progress with therapy. Baseline: HEP provided at eval Goal status: INITIAL  2.  Patient will report neck and left shoulder pain </= 4/10 in order to reduce functional limitations Baseline: 6-7/10 Goal status: INITIAL  LONG TERM GOALS: Target date: 09/19/2023  Patient will be I with final HEP to maintain progress from PT. Baseline: HEP provided at eval Goal status: INITIAL  2. Patient will report NDI </= 3/50 (6% disability) in order to indicate an improvement in their functional status Baseline: 9/50 (18% disability) Goal status: INITIAL  3.  Patient will demonstrate cervical rotation >/= 60 deg bilaterally to reduce neck tightness and improve driving Baseline: see limitations above Goal status: INITIAL  4.  Patient will exhibit left shoulder elevation >/= 140 deg to improve overhead reach and ability to perform self care tasks Baseline: 120 deg Goal status: INITIAL   PLAN: PT FREQUENCY: 1-2x/week  PT DURATION: 8 weeks  PLANNED INTERVENTIONS: 97164- PT Re-evaluation, 97110-Therapeutic exercises, 97530- Therapeutic activity, 97112- Neuromuscular re-education, 97535- Self Care, 54098- Manual therapy, Patient/Family education, Balance training, Dry Needling, Joint mobilization, Joint manipulation, Spinal manipulation, Spinal mobilization, Cryotherapy, and Moist heat  PLAN FOR NEXT SESSION: Review HEP and progress PRN, manual/TPDN for the cervical region, left shoulder mobs/PROM, progress postural control and rotator cuff strengthening    Leah Primus, PT, DPT, LAT, ATC 08/06/23  2:27 PM Phone: [phone] Fax: [phone]

## 2023-08-06 NOTE — Patient Instructions (Signed)
 Access Code: [email or web address] URL: [email or web address] Date: 08/06/2023 Prepared by: Leah Primus  Exercises - Seated Cervical Sidebending Stretch  - 1 x daily - 3 reps - 15 seconds hold - Standing shoulder flexion wall slides  - 1 x daily - 2 sets - 10 reps - 5 seconds hold - Single Arm Doorway Pec Stretch at 90 Degrees Abduction  - 1 x daily - 3 reps - 15 seconds hold - Standing Row with Anchored Resistance  - 1 x daily - 2 sets - 10 reps - Shoulder External Rotation with Anchored Resistance  - 1 x daily - 2 sets - 10 reps - Seated Shoulder Horizontal Abduction with Resistance - Palms Down  - 1 x daily - 2 sets - 10 reps - Seated Neck Retraction  - 1 x daily - 2 sets - 10 reps - 5 seconds hold

## 2023-08-21 DIAGNOSIS — I129 Hypertensive chronic kidney disease with stage 1 through stage 4 chronic kidney disease, or unspecified chronic kidney disease: Secondary | ICD-10-CM | POA: Diagnosis not present

## 2023-08-21 DIAGNOSIS — H6992 Unspecified Eustachian tube disorder, left ear: Secondary | ICD-10-CM | POA: Diagnosis not present

## 2023-08-21 DIAGNOSIS — G9389 Other specified disorders of brain: Secondary | ICD-10-CM | POA: Diagnosis not present

## 2023-08-21 DIAGNOSIS — M85859 Other specified disorders of bone density and structure, unspecified thigh: Secondary | ICD-10-CM | POA: Diagnosis not present

## 2023-08-21 DIAGNOSIS — N182 Chronic kidney disease, stage 2 (mild): Secondary | ICD-10-CM | POA: Diagnosis not present

## 2023-08-21 DIAGNOSIS — K862 Cyst of pancreas: Secondary | ICD-10-CM | POA: Diagnosis not present

## 2023-08-21 DIAGNOSIS — E785 Hyperlipidemia, unspecified: Secondary | ICD-10-CM | POA: Diagnosis not present

## 2023-08-21 DIAGNOSIS — G729 Myopathy, unspecified: Secondary | ICD-10-CM | POA: Diagnosis not present

## 2023-08-21 DIAGNOSIS — Z713 Dietary counseling and surveillance: Secondary | ICD-10-CM | POA: Diagnosis not present

## 2023-08-24 ENCOUNTER — Encounter (INDEPENDENT_AMBULATORY_CARE_PROVIDER_SITE_OTHER): Payer: Self-pay | Admitting: Physician Assistant

## 2023-08-24 ENCOUNTER — Ambulatory Visit (INDEPENDENT_AMBULATORY_CARE_PROVIDER_SITE_OTHER): Admitting: Physician Assistant

## 2023-08-24 VITALS — BP 118/75 | HR 65 | Ht 67.0 in | Wt 172.0 lb

## 2023-08-24 DIAGNOSIS — H902 Conductive hearing loss, unspecified: Secondary | ICD-10-CM | POA: Diagnosis not present

## 2023-08-24 DIAGNOSIS — H6692 Otitis media, unspecified, left ear: Secondary | ICD-10-CM

## 2023-08-24 DIAGNOSIS — H90A21 Sensorineural hearing loss, unilateral, right ear, with restricted hearing on the contralateral side: Secondary | ICD-10-CM | POA: Diagnosis not present

## 2023-08-24 DIAGNOSIS — H65192 Other acute nonsuppurative otitis media, left ear: Secondary | ICD-10-CM

## 2023-08-24 DIAGNOSIS — H90A32 Mixed conductive and sensorineural hearing loss, unilateral, left ear with restricted hearing on the contralateral side: Secondary | ICD-10-CM | POA: Diagnosis not present

## 2023-08-24 NOTE — Progress Notes (Addendum)
 Dear Dr. Charolet Cope, Here is my assessment for our mutual patient, Abigail Robinson. Thank you for allowing me the opportunity to care for your patient. Please do not hesitate to contact me should you have any other questions. Sincerely, Belma Boxer PA-C  Otolaryngology Clinic Note Referring provider: Dr. Charolet Cope HPI:  Abigail Robinson is a 73 y.o. female kindly referred by Dr. Charolet Cope   The patient is a 73 year old female seen in our office for evaluation of ear pressure.  The patient notes that approximate 5 weeks ago she started to have sinus pressure.  She was seen in urgent care and diagnosed with sinusitis.  She was reportedly started on cefdinir.  She notes that approximately 4 weeks ago she started develop left ear fullness and pressure.  She notes decreased hearing out of the left ear noting it sounds like she is in a barrel.  She was started on Flonase and has been taking loratadine as needed, no significant improvement in her symptoms.  She has been taking Flonase for 5 days.  She denies any significant preceding ear history with no history of recurrent ear infections, no trauma to the ear.  She notes occasional high-pitched tonal ringing in the bilateral ears, none today.  She has a remote history of vertigo but has not had any vertiginous symptoms in the last 2 years.  Today she notes some ongoing fullness in the left ear with slightly decreased hearing.  She notes a dry nonproductive cough, she denies any fever.  She was seen by audiology at [facility] and noted to have type B tympanometry on the left.  She was referred to our office today.  She is a non-smoker.    Independent Review of Additional Tests or Records:  Audiological evaluation   Mixed hearing loss with conductive component on the left side, left-sided type B tympanometry  PMH/Meds/All/SocHx/FamHx/ROS:   Past Medical History:  Diagnosis Date   Allergy    Arthritis    back, hands   Basal cell carcinoma    face   Colon polyps     Complication of anesthesia    prolonged sedation, slow to wake up   Diplopia    Endometriosis    GERD (gastroesophageal reflux disease)    Headache    Migraines   Hyperlipidemia    Hypertension    Hypothyroidism    Skin cancer    hx of   Status post dilation of esophageal narrowing    Thalamic cyst    Thyroid  disease    Vertigo      Past Surgical History:  Procedure Laterality Date   ANTERIOR AND POSTERIOR REPAIR WITH SACROSPINOUS FIXATION N/A 07/08/2015   Procedure: ANTERIOR AND POSTERIOR REPAIR ;  Surgeon: Belle Box, MD;  Location: [facility];  Service: Gynecology;  Laterality: N/A;   COLONOSCOPY     DIAGNOSTIC LAPAROSCOPY     LAPAROSCOPIC VAGINAL HYSTERECTOMY WITH SALPINGO OOPHORECTOMY Bilateral 07/08/2015   Procedure: LAPAROSCOPIC ASSISTED VAGINAL HYSTERECTOMY WITH BILATERAL SALPINGO OOPHORECTOMY;  Surgeon: Belle Box, MD;  Location: [facility];  Service: Gynecology;  Laterality: Bilateral;   OOPHORECTOMY  age 19   for endometrosis    Family History  Problem Relation Age of Onset   Liver cancer Mother    Kidney cancer Mother    Lung cancer Mother    Heart disease Father    Hypertension Father    Cirrhosis Father    Macular degeneration Father    Diabetes Brother    Heart attack Brother    Breast  cancer Maternal Grandmother    Stroke Maternal Grandmother    Colon cancer Neg Hx    Esophageal cancer Neg Hx    Stomach cancer Neg Hx      Social Connections: Not on file      Current Outpatient Medications:    acetaminophen  (TYLENOL ) 500 MG tablet, Take 1,000 mg by mouth 2 (two) times daily as needed for moderate pain or headache. , Disp: , Rfl:    atenolol  (TENORMIN ) 50 MG tablet, Take 1 tablet by mouth Daily., Disp: , Rfl:    Calcium Carb-Cholecalciferol 500-600 MG-UNIT TABS, Take 1 tablet by mouth daily., Disp: , Rfl:    celecoxib (CELEBREX) 200 MG capsule, Take 200 mg by mouth daily., Disp: , Rfl:    DULoxetine  (CYMBALTA ) 20 MG capsule, Take 2 capsules (40 mg total)  by mouth daily., Disp: 180 capsule, Rfl: 0   gabapentin  (NEURONTIN ) 100 MG capsule, Take 2 capsules (200 mg total) by mouth at bedtime., Disp: 120 capsule, Rfl: 0   hydrochlorothiazide (MICROZIDE) 12.5 MG capsule, Take 12.5 mg by mouth daily., Disp: , Rfl:    levothyroxine  (SYNTHROID , LEVOTHROID) 75 MCG tablet, Take 75 mcg by mouth at bedtime. , Disp: , Rfl:    losartan  (COZAAR ) 100 MG tablet, Take 100 mg by mouth daily., Disp: , Rfl:    meclizine  (ANTIVERT ) 25 MG tablet, Take 1 tablet (25 mg total) by mouth 3 (three) times daily as needed for dizziness., Disp: 30 tablet, Rfl: 0   omeprazole (PRILOSEC) 20 MG capsule, Take 20 mg by mouth daily as needed (for heartburn). , Disp: , Rfl:    predniSONE  (DELTASONE ) 20 MG tablet, Take 1 tablet (20 mg total) by mouth daily with breakfast., Disp: 7 tablet, Rfl: 0   predniSONE  (DELTASONE ) 50 MG tablet, Take 1 tab 13 hours prior to your upcoming procedure Take 1 tab 7 hours prior to your upcoming procedure  Take 1 tab 1 hour prior to your procedure., Disp: 3 tablet, Rfl: 0   Probiotic Product (PROBIOTIC PO), Take 1 capsule by mouth daily. Ultimate 16 strain w/ trace minerals & sos, Disp: , Rfl:    tiZANidine  (ZANAFLEX ) 2 MG tablet, Take 1 tablet (2 mg total) by mouth at bedtime., Disp: 30 tablet, Rfl: 0   vitamin B-12 (CYANOCOBALAMIN ) 1000 MCG tablet, Take 1,000 mcg by mouth daily., Disp: , Rfl:    Vitamins A & D (VITAMIN A & D) 5000-400 UNITS CAPS, Take 1 capsule by mouth daily., Disp: , Rfl:    Physical Exam:   BP 118/75   Pulse 65   Ht 5\' 7"  (1.702 m)   Wt 172 lb (78 kg)   SpO2 90%   BMI 26.94 kg/m   Pertinent Findings  CN II-XII intact Bilateral EAC clear, right TM intact with well pneumatized middle ear space, left TM intact with mucoid effusion  Anterior rhinoscopy: Septum midline; bilateral inferior turbinates with hypertrophy No lesions of oral cavity/oropharynx; dentition within normal limits No obviously palpable neck  masses/lymphadenopathy/thyromegaly No respiratory distress or stridor No lesion noted at the eustachian tube meatus         Seprately Identifiable Procedures:  Procedure Note Pre-procedure diagnosis:  left middle ear effusion  Post-procedure diagnosis: Same Procedure: Transnasal Fiberoptic Laryngoscopy, CPT 31575 - Mod 25 Indication: see above Complications: None apparent EBL: 0 mL   The procedure was undertaken to further evaluate the patient's complaint of left middle ear effusion, with mirror exam inadequate for appropriate examination due to gag reflex and poor  patient tolerance   Procedure:  Patient was identified as correct patient. Verbal consent was obtained. The nose was sprayed with oxymetazoline and 4% lidocaine . The The flexible laryngoscope was passed through the nose to view the nasal cavity, pharynx (oropharynx, hypopharynx) and larynx.  The larynx was examined at rest and during multiple phonatory tasks. Documentation was obtained and reviewed with patient. The scope was removed. The patient tolerated the procedure well.   Findings: The nasal cavity and nasopharynx did not reveal any masses or lesions, mucosa appeared to be without obvious lesions. The tongue base, pharyngeal walls, piriform sinuses, vallecula, epiglottis and postcricoid region are normal in appearance. The visualized portion of the subglottis and proximal trachea is widely patent. The vocal folds are mobile bilaterally.. There are no lesions on the free edge of the vocal folds nor elsewhere in the larynx worrisome for malignancy.   Impression & Plans:  Abigail Robinson is a 73 y.o. female with the following   Otitis media-  This is a 73 year old female seen in our office for evaluation of hearing loss.  She does have an effusion on the left with a conductive hearing loss as well.  She does have sensorineural hearing loss at baseline, she wears hearing aids.  Nasal endoscopy showed no suspicious lesions.   Likely persistent fluid status post recent upper respiratory infection.  I have recommended that she continue Flonase daily as well as a daily antihistamine and sinus irrigation.  I like to see her back in the office in 2 months with repeat audiogram and office visit.  She will follow-up sooner as needed.  She verbalized understanding and agreement to this plan had no further questions or concerns.   - f/u 2 months or sooner as needed    Thank you for allowing me the opportunity to care for your patient. Please do not hesitate to contact me should you have any other questions.  Sincerely, Belma Boxer PA-C [facility] Phone: [phone] Fax: [phone]  08/24/2023, 11:18 AM

## 2023-08-27 ENCOUNTER — Other Ambulatory Visit: Payer: Self-pay

## 2023-08-27 ENCOUNTER — Encounter: Payer: Self-pay | Admitting: Physical Therapy

## 2023-08-27 ENCOUNTER — Ambulatory Visit (INDEPENDENT_AMBULATORY_CARE_PROVIDER_SITE_OTHER): Admitting: Physical Therapy

## 2023-08-27 DIAGNOSIS — M6281 Muscle weakness (generalized): Secondary | ICD-10-CM | POA: Diagnosis not present

## 2023-08-27 DIAGNOSIS — M542 Cervicalgia: Secondary | ICD-10-CM

## 2023-08-27 DIAGNOSIS — M25512 Pain in left shoulder: Secondary | ICD-10-CM

## 2023-08-27 DIAGNOSIS — G8929 Other chronic pain: Secondary | ICD-10-CM | POA: Diagnosis not present

## 2023-08-27 NOTE — Therapy (Signed)
 OUTPATIENT PHYSICAL THERAPY TREATMENT   Patient Name: Abigail Robinson MRN: [id number] DOB:1950-08-08, 73 y.o., female Today's Date: 08/27/2023   END OF SESSION:  PT End of Session - 08/27/23 1348     Visit Number 4    Number of Visits 9    Date for PT Re-Evaluation 09/19/23    Authorization Type MCR    Progress Note Due on Visit 10    PT Start Time 1346    PT Stop Time 1425    PT Time Calculation (min) 39 min    Activity Tolerance Patient tolerated treatment well    Behavior During Therapy WFL for tasks assessed/performed                Past Medical History:  Diagnosis Date   Allergy    Arthritis    back, hands   Basal cell carcinoma    face   Colon polyps    Complication of anesthesia    prolonged sedation, slow to wake up   Diplopia    Endometriosis    GERD (gastroesophageal reflux disease)    Headache    Migraines   Hyperlipidemia    Hypertension    Hypothyroidism    Skin cancer    hx of   Status post dilation of esophageal narrowing    Thalamic cyst    Thyroid  disease    Vertigo    Past Surgical History:  Procedure Laterality Date   ANTERIOR AND POSTERIOR REPAIR WITH SACROSPINOUS FIXATION N/A 07/08/2015   Procedure: ANTERIOR AND POSTERIOR REPAIR ;  Surgeon: Belle Box, MD;  Location: [facility];  Service: Gynecology;  Laterality: N/A;   COLONOSCOPY     DIAGNOSTIC LAPAROSCOPY     LAPAROSCOPIC VAGINAL HYSTERECTOMY WITH SALPINGO OOPHORECTOMY Bilateral 07/08/2015   Procedure: LAPAROSCOPIC ASSISTED VAGINAL HYSTERECTOMY WITH BILATERAL SALPINGO OOPHORECTOMY;  Surgeon: Belle Box, MD;  Location: [facility];  Service: Gynecology;  Laterality: Bilateral;   OOPHORECTOMY  age 57   for endometrosis   Patient Active Problem List   Diagnosis Date Noted   Greater trochanteric bursitis of right hip 11/14/2022   Arthritis of carpometacarpal [facility]) joint of left thumb 11/14/2022   Patellofemoral arthritis of right knee 09/18/2022   Rotator cuff arthropathy of left  shoulder 05/18/2022   AC (acromioclavicular) arthritis 05/18/2022   Double vision 01/04/2022   Degenerative disc disease, lumbar 08/31/2020   Degenerative cervical disc 02/16/2020   S/P laparoscopic assisted vaginal hysterectomy (LAVH) 07/08/2015    PCP: Azalia Leo, MD  REFERRING PROVIDER: Isidro Margo, DO  REFERRING DIAG: Rotator cuff arthropathy of left shoulder; Whiplash injury to neck, initial encounter  THERAPY DIAG:  Cervicalgia  Chronic left shoulder pain  Muscle weakness (generalized)  Rationale for Evaluation and Treatment: Rehabilitation  ONSET DATE: Chronic   SUBJECTIVE:        SUBJECTIVE STATEMENT: Patient reports her neck is feeling good and the left arm is great. She states her trip went well. She is reported some lower back pain from doing yard work this morning.   Eval: Patient reports she fell about 3 weeks ago and she is having some neck tightness mainly on the left side, and left shoulder pain. She was walking a dog that pulled her down. She has difficulty trying to do her hair for extended periods of time, and she trouble reaching backward with the left arm. She has been doing some left shoulder exercises such as upper trap stretch, wall slide, and banded row. States occasionally have pain  that radiates down the left arm but pain mainly is up in the shoulder. She denies any numbness or tingling. She does endorse clicking and popping in her shoulders. She is able to sleep pretty well without being woken by her neck or shoulder pain.  Hand dominance: Right  PERTINENT HISTORY:  See PMH above  PAIN:  Are you having pain? Yes:  NPRS scale: 0/10 at rest, 3-4/10 at worst Pain location: Neck and left shoulder Pain description: Tight Aggravating factors: Turning neck, reaching overhead with left arm, reach back with left arm Relieving factors: Rest  PRECAUTIONS: Fall  PATIENT GOALS: Reduce neck tightness   OBJECTIVE:  Note: Objective measures were  completed at Evaluation unless otherwise noted. PATIENT SURVEYS:  NDI 9/50 (18% disability)  POSTURE:   Rounded shoulder and forward head posture  PALPATION: Tender to palpation bilateral upper trap and cervical paraspinals   CERVICAL ROM:   Active ROM A/PROM (deg) eval   07/30/2023   08/27/2023  Flexion     Extension     Right lateral flexion     Left lateral flexion     Right rotation 45 50 55  Left rotation 40 55 50   (Blank rows = not tested)  UPPER EXTREMITY ROM:  Active ROM Right eval Left eval Left 07/30/2023 Left 08/27/2023  Shoulder flexion 140 120 130 130  Shoulder extension      Shoulder abduction      Shoulder adduction      Shoulder extension      Shoulder internal rotation      Shoulder external rotation Reach to C7 Reach to occiput    Elbow flexion      Elbow extension      Wrist flexion      Wrist extension      Wrist ulnar deviation      Wrist radial deviation      Wrist pronation      Wrist supination       (Blank rows = not tested)  UPPER EXTREMITY MMT:  MMT Right eval Left eval  Shoulder flexion 4 4-  Shoulder extension    Shoulder abduction    Shoulder adduction    Shoulder extension    Shoulder internal rotation    Shoulder external rotation 4 4-  Middle trapezius    Lower trapezius    Elbow flexion    Elbow extension    Wrist flexion    Wrist extension    Wrist ulnar deviation    Wrist radial deviation    Wrist pronation    Wrist supination    Grip strength     (Blank rows = not tested)  CERVICAL SPECIAL TESTS:  Cervical radicular testing negative  FUNCTIONAL TESTS:  DNF endurance: 3 seconds   TREATMENT [facility] Adult PT Treatment:                                                DATE: 08/27/2023 LTR 5 x 5 sec Piriformis stretch 3 x 20 sec each Supine suboccipital release with gentle manual traction Passive upper trap and levator scap stretch  Supine horizontal abduction with yellow 2 x 10 Bridge 2 x 10 Seated double  ER and scap retraction with yellow 2 x 10   PATIENT EDUCATION:  Education details: HEP Person educated: Patient Education method: Programmer, multimedia, Facilities manager, Actor cues, Verbal cues  Education comprehension: verbalized understanding, returned demonstration, verbal cues required, tactile cues required, and needs further education  HOME EXERCISE PROGRAM: Access Code: [email or web address]    ASSESSMENT: CLINICAL IMPRESSION: Patient tolerated therapy well with no adverse effects. Therapy focused on mobility for the neck and lower back region and postural exercises with good tolerance. She does seem to be doing better and reports improvement in pain and tightness. She does still exhibit some limitations in her cervical mobility. No changes were made to her HEP this visit. Patient would benefit from continued skilled PT to progress mobility and strength in order to reduce pain and maximize functional ability.   Eval: Patient is a 73 y.o. female who was seen today for physical therapy evaluation and treatment for acute on chronic neck tightness and chronic left shoulder pain. She does exhibit decreased cervical rotation bilaterally with muscular tightness and cervical hypomobility. She also exhibits limitations with left shoulder motion and rotator cuff strength deficit.   OBJECTIVE IMPAIRMENTS: decreased activity tolerance, decreased ROM, decreased strength, impaired flexibility, postural dysfunction, and pain.   ACTIVITY LIMITATIONS: carrying, lifting, bathing, dressing, and reach over head  PARTICIPATION LIMITATIONS: meal prep, cleaning, driving, shopping, and community activity  PERSONAL FACTORS: Fitness, Past/current experiences, and Time since onset of injury/illness/exacerbation are also affecting patient's functional outcome.    GOALS: Goals reviewed with patient? Yes  SHORT TERM GOALS: Target date: 08/22/2023  Patient will be I with initial HEP in order to progress with therapy. Baseline: HEP  provided at eval 08/27/2023: independent Goal status: MET  2.  Patient will report neck and left shoulder pain </= 4/10 in order to reduce functional limitations Baseline: 6-7/10 08/27/2023: 3-4/10 pain Goal status: MET  LONG TERM GOALS: Target date: 09/19/2023  Patient will be I with final HEP to maintain progress from PT. Baseline: HEP provided at eval Goal status: INITIAL  2. Patient will report NDI </= 3/50 (6% disability) in order to indicate an improvement in their functional status Baseline: 9/50 (18% disability) Goal status: INITIAL  3.  Patient will demonstrate cervical rotation >/= 60 deg bilaterally to reduce neck tightness and improve driving Baseline: see limitations above Goal status: INITIAL  4.  Patient will exhibit left shoulder elevation >/= 140 deg to improve overhead reach and ability to perform self care tasks Baseline: 120 deg Goal status: INITIAL   PLAN: PT FREQUENCY: 1-2x/week  PT DURATION: 8 weeks  PLANNED INTERVENTIONS: 97164- PT Re-evaluation, 97110-Therapeutic exercises, 97530- Therapeutic activity, 97112- Neuromuscular re-education, 97535- Self Care, 16109- Manual therapy, Patient/Family education, Balance training, Dry Needling, Joint mobilization, Joint manipulation, Spinal manipulation, Spinal mobilization, Cryotherapy, and Moist heat  PLAN FOR NEXT SESSION: Review HEP and progress PRN, manual/TPDN for the cervical region, left shoulder mobs/PROM, progress postural control and rotator cuff strengthening    Leah Primus, PT, DPT, LAT, ATC 08/27/23  2:36 PM Phone: [phone] Fax: [phone]

## 2023-08-28 ENCOUNTER — Telehealth: Payer: Self-pay

## 2023-08-28 DIAGNOSIS — K862 Cyst of pancreas: Secondary | ICD-10-CM

## 2023-08-28 NOTE — Telephone Encounter (Signed)
 The pt returned call and agrees to the MRI- Order entered and sent to the schedulers

## 2023-08-28 NOTE — Telephone Encounter (Signed)
-----   Message from Nurse Jalexa Pifer P sent at 08/28/2022 10:41 AM EDT ----- I would recommend 1 more MRI/MRCP in 1 year just to ensure that the pancreas cysts are unchanged and then discuss potentially stopping surveillance.

## 2023-08-28 NOTE — Telephone Encounter (Signed)
 Left message on machine to call back

## 2023-09-03 ENCOUNTER — Encounter: Payer: Self-pay | Admitting: Physical Therapy

## 2023-09-03 ENCOUNTER — Ambulatory Visit (INDEPENDENT_AMBULATORY_CARE_PROVIDER_SITE_OTHER): Admitting: Physical Therapy

## 2023-09-03 ENCOUNTER — Other Ambulatory Visit: Payer: Self-pay

## 2023-09-03 DIAGNOSIS — G8929 Other chronic pain: Secondary | ICD-10-CM | POA: Diagnosis not present

## 2023-09-03 DIAGNOSIS — M542 Cervicalgia: Secondary | ICD-10-CM

## 2023-09-03 DIAGNOSIS — M25512 Pain in left shoulder: Secondary | ICD-10-CM | POA: Diagnosis not present

## 2023-09-03 DIAGNOSIS — M6281 Muscle weakness (generalized): Secondary | ICD-10-CM

## 2023-09-03 NOTE — Therapy (Addendum)
 OUTPATIENT PHYSICAL THERAPY TREATMENT  DISCHARGE   Patient Name: Abigail Robinson MRN: [id number] DOB:[birth year], 73 y.o., female Today's Date: 09/03/2023   END OF SESSION:  PT End of Session - 09/03/23 1352     Visit Number 5    Number of Visits 9    Date for PT Re-Evaluation 09/19/23    Authorization Type MCR    Progress Note Due on Visit 10    PT Start Time 1346    PT Stop Time 1428    PT Time Calculation (min) 42 min    Activity Tolerance Patient tolerated treatment well    Behavior During Therapy WFL for tasks assessed/performed              Past Medical History:  Diagnosis Date   Allergy    Arthritis    back, hands   Basal cell carcinoma    face   Colon polyps    Complication of anesthesia    prolonged sedation, slow to wake up   Diplopia    Endometriosis    GERD (gastroesophageal reflux disease)    Headache    Migraines   Hyperlipidemia    Hypertension    Hypothyroidism    Skin cancer    hx of   Status post dilation of esophageal narrowing    Thalamic cyst    Thyroid  disease    Vertigo    Past Surgical History:  Procedure Laterality Date   ANTERIOR AND POSTERIOR REPAIR WITH SACROSPINOUS FIXATION N/A 07/08/2015   Procedure: ANTERIOR AND POSTERIOR REPAIR ;  Surgeon: Alm Cook, MD;  Location: [facility];  Service: Gynecology;  Laterality: N/A;   COLONOSCOPY     DIAGNOSTIC LAPAROSCOPY     LAPAROSCOPIC VAGINAL HYSTERECTOMY WITH SALPINGO OOPHORECTOMY Bilateral 07/08/2015   Procedure: LAPAROSCOPIC ASSISTED VAGINAL HYSTERECTOMY WITH BILATERAL SALPINGO OOPHORECTOMY;  Surgeon: Alm Cook, MD;  Location: [facility];  Service: Gynecology;  Laterality: Bilateral;   OOPHORECTOMY  age 51   for endometrosis   Patient Active Problem List   Diagnosis Date Noted   Greater trochanteric bursitis of right hip 11/14/2022   Arthritis of carpometacarpal [facility]) joint of left thumb 11/14/2022   Patellofemoral arthritis of right knee 09/18/2022   Rotator cuff arthropathy of  left shoulder 05/18/2022   AC (acromioclavicular) arthritis 05/18/2022   Double vision 01/04/2022   Degenerative disc disease, lumbar 08/31/2020   Degenerative cervical disc 02/16/2020   S/P laparoscopic assisted vaginal hysterectomy (LAVH) 07/08/2015    PCP: Stephane Leita DEL, MD  REFERRING PROVIDER: Claudene Arthea HERO, DO  REFERRING DIAG: Rotator cuff arthropathy of left shoulder; Whiplash injury to neck, initial encounter  THERAPY DIAG:  Cervicalgia  Chronic left shoulder pain  Muscle weakness (generalized)  Rationale for Evaluation and Treatment: Rehabilitation  ONSET DATE: Chronic   SUBJECTIVE:        SUBJECTIVE STATEMENT: Patient reports everything except for the neck is doing good. Her neck is feeling tight, still more on the left. She was able to pick up a goat over the weekend.   Eval: Patient reports she fell about 3 weeks ago and she is having some neck tightness mainly on the left side, and left shoulder pain. She was walking a dog that pulled her down. She has difficulty trying to do her hair for extended periods of time, and she trouble reaching backward with the left arm. She has been doing some left shoulder exercises such as upper trap stretch, wall slide, and banded row. States occasionally have pain that  radiates down the left arm but pain mainly is up in the shoulder. She denies any numbness or tingling. She does endorse clicking and popping in her shoulders. She is able to sleep pretty well without being woken by her neck or shoulder pain.  Hand dominance: Right  PERTINENT HISTORY:  See PMH above  PAIN:  Are you having pain? Yes:  NPRS scale: 0/10 at rest, 3-4/10 at worst Pain location: Neck and left shoulder Pain description: Tight Aggravating factors: Turning neck, reaching overhead with left arm, reach back with left arm Relieving factors: Rest  PRECAUTIONS: Fall  PATIENT GOALS: Reduce neck tightness   OBJECTIVE:  Note: Objective measures were  completed at Evaluation unless otherwise noted. PATIENT SURVEYS:  NDI 9/50 (18% disability)  POSTURE:   Rounded shoulder and forward head posture  PALPATION: Tender to palpation bilateral upper trap and cervical paraspinals   CERVICAL ROM:   Active ROM A/PROM (deg) eval   07/30/2023   08/27/2023   09/03/2023  Flexion      Extension      Right lateral flexion      Left lateral flexion      Right rotation 45 50 55 50  Left rotation 40 55 50 50   (Blank rows = not tested)  UPPER EXTREMITY ROM:  Active ROM Right eval Left eval Left 07/30/2023 Left 08/27/2023  Shoulder flexion 140 120 130 130  Shoulder extension      Shoulder abduction      Shoulder adduction      Shoulder extension      Shoulder internal rotation      Shoulder external rotation Reach to C7 Reach to occiput    Elbow flexion      Elbow extension      Wrist flexion      Wrist extension      Wrist ulnar deviation      Wrist radial deviation      Wrist pronation      Wrist supination       (Blank rows = not tested)  UPPER EXTREMITY MMT:  MMT Right eval Left eval  Shoulder flexion 4 4-  Shoulder extension    Shoulder abduction    Shoulder adduction    Shoulder extension    Shoulder internal rotation    Shoulder external rotation 4 4-  Middle trapezius    Lower trapezius    Elbow flexion    Elbow extension    Wrist flexion    Wrist extension    Wrist ulnar deviation    Wrist radial deviation    Wrist pronation    Wrist supination    Grip strength     (Blank rows = not tested)  CERVICAL SPECIAL TESTS:  Cervical radicular testing negative  FUNCTIONAL TESTS:  DNF endurance: 3 seconds   TREATMENT [facility] Adult PT Treatment:                                                DATE: 09/03/2023 UBE L2 x 5 min (fwd/bwd) to improve endurance and workload capacity Seated cervical rotational SNAG 3 x 10 each STM / TPR to left upper trap and levator scap region Levator scap stretch 3 x 15 sec  each Instruction on SMFR using tennis ball for home Seated cervical retraction 2 x 10 x 5 sec  PATIENT EDUCATION:  Education  details: HEP update Person educated: Patient Education method: Explanation, Demonstration, Tactile cues, Verbal cues, Handout Education comprehension: verbalized understanding, returned demonstration, verbal cues required, tactile cues required, and needs further education  HOME EXERCISE PROGRAM: Access Code: [email or web address]    ASSESSMENT: CLINICAL IMPRESSION: Patient tolerated therapy well with no adverse effects. Therapy focused on improving her cervical mobility and progressing postural control with good tolerance. Incorporated cervical SNAGs with patient having some difficulty using the left shoulder but was able to complete. She did report improvement in feeling of tightness following therapy. Updated her HEP to incorporate SNAGs and instructed on using tennis ball for home use. Patient would benefit from continued skilled PT to progress mobility and strength in order to reduce pain and maximize functional ability.   Eval: Patient is a 73 y.o. female who was seen today for physical therapy evaluation and treatment for acute on chronic neck tightness and chronic left shoulder pain. She does exhibit decreased cervical rotation bilaterally with muscular tightness and cervical hypomobility. She also exhibits limitations with left shoulder motion and rotator cuff strength deficit.   OBJECTIVE IMPAIRMENTS: decreased activity tolerance, decreased ROM, decreased strength, impaired flexibility, postural dysfunction, and pain.   ACTIVITY LIMITATIONS: carrying, lifting, bathing, dressing, and reach over head  PARTICIPATION LIMITATIONS: meal prep, cleaning, driving, shopping, and community activity  PERSONAL FACTORS: Fitness, Past/current experiences, and Time since onset of injury/illness/exacerbation are also affecting patient's functional outcome.    GOALS: Goals reviewed  with patient? Yes  SHORT TERM GOALS: Target date: 08/22/2023  Patient will be I with initial HEP in order to progress with therapy. Baseline: HEP provided at eval 08/27/2023: independent Goal status: MET  2.  Patient will report neck and left shoulder pain </= 4/10 in order to reduce functional limitations Baseline: 6-7/10 08/27/2023: 3-4/10 pain Goal status: MET  LONG TERM GOALS: Target date: 09/19/2023  Patient will be I with final HEP to maintain progress from PT. Baseline: HEP provided at eval Goal status: INITIAL  2. Patient will report NDI </= 3/50 (6% disability) in order to indicate an improvement in their functional status Baseline: 9/50 (18% disability) Goal status: INITIAL  3.  Patient will demonstrate cervical rotation >/= 60 deg bilaterally to reduce neck tightness and improve driving Baseline: see limitations above Goal status: INITIAL  4.  Patient will exhibit left shoulder elevation >/= 140 deg to improve overhead reach and ability to perform self care tasks Baseline: 120 deg Goal status: INITIAL   PLAN: PT FREQUENCY: 1-2x/week  PT DURATION: 8 weeks  PLANNED INTERVENTIONS: 97164- PT Re-evaluation, 97110-Therapeutic exercises, 97530- Therapeutic activity, 97112- Neuromuscular re-education, 97535- Self Care, [id number]- Manual therapy, Patient/Family education, Balance training, Dry Needling, Joint mobilization, Joint manipulation, Spinal manipulation, Spinal mobilization, Cryotherapy, and Moist heat  PLAN FOR NEXT SESSION: Review HEP and progress PRN, manual/TPDN for the cervical region, left shoulder mobs/PROM, progress postural control and rotator cuff strengthening    Elaine Daring, PT, DPT, LAT, ATC 09/03/23  2:32 PM Phone: [phone] Fax: [phone]    PHYSICAL THERAPY DISCHARGE SUMMARY  Visits from Start of Care: 5  Current functional level related to goals / functional outcomes: See above   Remaining deficits: See above   Education /  Equipment: HEP   Patient agrees to discharge. Patient goals were partially met. Patient is being discharged due to not returning since the last visit.  Elaine Daring, PT, DPT, LAT, ATC 01/09/24  12:37 PM Phone: [phone] Fax: [phone]

## 2023-09-03 NOTE — Patient Instructions (Signed)
 Access Code: [email or web address] URL: [email or web address] Date: 09/03/2023 Prepared by: Leah Primus  Exercises - Seated Cervical Sidebending Stretch  - 1 x daily - 3 reps - 15 seconds hold - Seated Levator Scapulae Stretch  - 1 x daily - 3 reps - 15 seconds hold - Seated Assisted Cervical Rotation with Towel  - 1 x daily - 2 sets - 10 reps - Seated Neck Retraction  - 1 x daily - 2 sets - 10 reps - 5 seconds hold - Standing shoulder flexion wall slides  - 1 x daily - 2 sets - 10 reps - 5 seconds hold - Single Arm Doorway Pec Stretch at 90 Degrees Abduction  - 1 x daily - 3 reps - 15 seconds hold - Standing Row with Anchored Resistance  - 1 x daily - 2 sets - 10 reps - Shoulder External Rotation with Anchored Resistance  - 1 x daily - 2 sets - 10 reps - Seated Shoulder Horizontal Abduction with Resistance - Palms Down  - 1 x daily - 2 sets - 10 reps

## 2023-09-05 ENCOUNTER — Encounter (HOSPITAL_COMMUNITY): Payer: Self-pay

## 2023-09-05 ENCOUNTER — Ambulatory Visit (HOSPITAL_COMMUNITY)
Admission: RE | Admit: 2023-09-05 | Discharge: 2023-09-05 | Disposition: A | Discharge status: Home / Self Care | Source: Ambulatory Visit | Attending: Gastroenterology | Admitting: Gastroenterology

## 2023-09-05 ENCOUNTER — Other Ambulatory Visit: Payer: Self-pay | Admitting: Gastroenterology

## 2023-09-05 DIAGNOSIS — K862 Cyst of pancreas: Secondary | ICD-10-CM | POA: Diagnosis not present

## 2023-09-05 DIAGNOSIS — Z9049 Acquired absence of other specified parts of digestive tract: Secondary | ICD-10-CM | POA: Diagnosis not present

## 2023-09-05 DIAGNOSIS — K838 Other specified diseases of biliary tract: Secondary | ICD-10-CM | POA: Diagnosis not present

## 2023-09-05 MED ORDER — GADOBUTROL 1 MMOL/ML IV SOLN
8.0000 mL | Freq: Once | INTRAVENOUS | Status: AC | PRN
Start: 2023-09-05 — End: 2023-09-05
  Administered 2023-09-05: 8 mL via INTRAVENOUS

## 2023-09-07 ENCOUNTER — Ambulatory Visit: Payer: Self-pay | Admitting: Gastroenterology

## 2023-09-24 ENCOUNTER — Encounter: Admitting: Physical Therapy

## 2023-09-26 DIAGNOSIS — S0101XA Laceration without foreign body of scalp, initial encounter: Secondary | ICD-10-CM | POA: Diagnosis not present

## 2023-09-26 DIAGNOSIS — M25512 Pain in left shoulder: Secondary | ICD-10-CM | POA: Diagnosis not present

## 2023-09-26 DIAGNOSIS — S0990XA Unspecified injury of head, initial encounter: Secondary | ICD-10-CM | POA: Diagnosis not present

## 2023-09-26 DIAGNOSIS — M47812 Spondylosis without myelopathy or radiculopathy, cervical region: Secondary | ICD-10-CM | POA: Diagnosis not present

## 2023-09-26 DIAGNOSIS — Z043 Encounter for examination and observation following other accident: Secondary | ICD-10-CM | POA: Diagnosis not present

## 2023-09-26 DIAGNOSIS — M19012 Primary osteoarthritis, left shoulder: Secondary | ICD-10-CM | POA: Diagnosis not present

## 2023-09-27 DIAGNOSIS — R1111 Vomiting without nausea: Secondary | ICD-10-CM | POA: Diagnosis not present

## 2023-09-27 DIAGNOSIS — W19XXXA Unspecified fall, initial encounter: Secondary | ICD-10-CM | POA: Diagnosis not present

## 2023-09-27 DIAGNOSIS — I10 Essential (primary) hypertension: Secondary | ICD-10-CM | POA: Diagnosis not present

## 2023-09-27 DIAGNOSIS — Z5321 Procedure and treatment not carried out due to patient leaving prior to being seen by health care provider: Secondary | ICD-10-CM | POA: Diagnosis not present

## 2023-09-27 DIAGNOSIS — R1084 Generalized abdominal pain: Secondary | ICD-10-CM | POA: Diagnosis not present

## 2023-09-27 DIAGNOSIS — R110 Nausea: Secondary | ICD-10-CM | POA: Diagnosis not present

## 2023-09-27 DIAGNOSIS — R109 Unspecified abdominal pain: Secondary | ICD-10-CM | POA: Diagnosis not present

## 2023-09-27 DIAGNOSIS — R112 Nausea with vomiting, unspecified: Secondary | ICD-10-CM | POA: Diagnosis not present

## 2023-09-27 NOTE — ED Triage Notes (Signed)
 Pt arrives via EMS from home with complaints of abdominal pain, nausea and vomiting that began at 2pm today

## 2023-09-28 ENCOUNTER — Emergency Department (HOSPITAL_BASED_OUTPATIENT_CLINIC_OR_DEPARTMENT_OTHER)
Admission: EM | Admit: 2023-09-28 | Discharge: 2023-09-28 | Disposition: A | Discharge status: Home / Self Care | Source: Intra-hospital

## 2023-09-28 ENCOUNTER — Emergency Department (HOSPITAL_BASED_OUTPATIENT_CLINIC_OR_DEPARTMENT_OTHER)

## 2023-09-28 ENCOUNTER — Encounter (HOSPITAL_BASED_OUTPATIENT_CLINIC_OR_DEPARTMENT_OTHER): Payer: Self-pay

## 2023-09-28 ENCOUNTER — Other Ambulatory Visit: Payer: Self-pay

## 2023-09-28 DIAGNOSIS — K8000 Calculus of gallbladder with acute cholecystitis without obstruction: Secondary | ICD-10-CM | POA: Diagnosis not present

## 2023-09-28 DIAGNOSIS — R748 Abnormal levels of other serum enzymes: Secondary | ICD-10-CM | POA: Diagnosis not present

## 2023-09-28 DIAGNOSIS — R7401 Elevation of levels of liver transaminase levels: Secondary | ICD-10-CM | POA: Diagnosis not present

## 2023-09-28 DIAGNOSIS — K5730 Diverticulosis of large intestine without perforation or abscess without bleeding: Secondary | ICD-10-CM | POA: Diagnosis not present

## 2023-09-28 DIAGNOSIS — S0101XD Laceration without foreign body of scalp, subsequent encounter: Secondary | ICD-10-CM | POA: Insufficient documentation

## 2023-09-28 DIAGNOSIS — E876 Hypokalemia: Secondary | ICD-10-CM | POA: Diagnosis not present

## 2023-09-28 DIAGNOSIS — R519 Headache, unspecified: Secondary | ICD-10-CM | POA: Insufficient documentation

## 2023-09-28 DIAGNOSIS — K851 Biliary acute pancreatitis without necrosis or infection: Secondary | ICD-10-CM | POA: Diagnosis not present

## 2023-09-28 DIAGNOSIS — R7989 Other specified abnormal findings of blood chemistry: Secondary | ICD-10-CM

## 2023-09-28 DIAGNOSIS — S3991XA Unspecified injury of abdomen, initial encounter: Secondary | ICD-10-CM | POA: Diagnosis not present

## 2023-09-28 DIAGNOSIS — R1084 Generalized abdominal pain: Secondary | ICD-10-CM | POA: Diagnosis present

## 2023-09-28 DIAGNOSIS — Z5321 Procedure and treatment not carried out due to patient leaving prior to being seen by health care provider: Secondary | ICD-10-CM | POA: Diagnosis not present

## 2023-09-28 DIAGNOSIS — S0990XA Unspecified injury of head, initial encounter: Secondary | ICD-10-CM | POA: Diagnosis not present

## 2023-09-28 LAB — URINALYSIS, ROUTINE W REFLEX MICROSCOPIC
Glucose, UA: NEGATIVE mg/dL
Ketones, ur: NEGATIVE mg/dL
Leukocytes,Ua: NEGATIVE
Nitrite: NEGATIVE
Protein, ur: 100 mg/dL — AB
Specific Gravity, Urine: 1.025 (ref 1.005–1.030)
pH: 5.5 (ref 5.0–8.0)

## 2023-09-28 LAB — CBC
HCT: 45.8 % (ref 36.0–46.0)
Hemoglobin: 15.5 g/dL — ABNORMAL HIGH (ref 12.0–15.0)
MCH: 31.4 pg (ref 26.0–34.0)
MCHC: 33.8 g/dL (ref 30.0–36.0)
MCV: 92.7 fL (ref 80.0–100.0)
Platelets: 326 K/uL (ref 150–400)
RBC: 4.94 MIL/uL (ref 3.87–5.11)
RDW: 14.1 % (ref 11.5–15.5)
WBC: 17.4 K/uL — ABNORMAL HIGH (ref 4.0–10.5)
nRBC: 0 % (ref 0.0–0.2)

## 2023-09-28 LAB — COMPREHENSIVE METABOLIC PANEL WITH GFR
ALT: 328 U/L — ABNORMAL HIGH (ref 0–44)
AST: 254 U/L — ABNORMAL HIGH (ref 15–41)
Albumin: 4.2 g/dL (ref 3.5–5.0)
Alkaline Phosphatase: 214 U/L — ABNORMAL HIGH (ref 38–126)
Anion gap: 16 — ABNORMAL HIGH (ref 5–15)
BUN: 20 mg/dL (ref 8–23)
CO2: 25 mmol/L (ref 22–32)
Calcium: 9.2 mg/dL (ref 8.9–10.3)
Chloride: 98 mmol/L (ref 98–111)
Creatinine, Ser: 0.88 mg/dL (ref 0.44–1.00)
GFR, Estimated: >60 mL/min (ref >60)
Glucose, Bld: 144 mg/dL — ABNORMAL HIGH (ref 70–99)
Potassium: 3.4 mmol/L — ABNORMAL LOW (ref 3.5–5.1)
Sodium: 139 mmol/L (ref 135–145)
Total Bilirubin: 1.7 mg/dL — ABNORMAL HIGH (ref 0.0–1.2)
Total Protein: 7.2 g/dL (ref 6.5–8.1)

## 2023-09-28 LAB — URINALYSIS, MICROSCOPIC (REFLEX)

## 2023-09-28 LAB — LIPASE, BLOOD: Lipase: >2800 U/L — ABNORMAL HIGH (ref 11–51)

## 2023-09-28 MED ORDER — PROCHLORPERAZINE EDISYLATE 10 MG/2ML IJ SOLN
10.0000 mg | Freq: Once | INTRAMUSCULAR | Status: AC
  Administered 2023-09-28: 10 mg via INTRAVENOUS
  Filled 2023-09-28: qty 2

## 2023-09-28 MED ORDER — ONDANSETRON HCL 4 MG/2ML IJ SOLN
4.0000 mg | Freq: Once | INTRAMUSCULAR | Status: AC | PRN
  Administered 2023-09-28: 4 mg via INTRAVENOUS
  Filled 2023-09-28: qty 2

## 2023-09-28 MED ORDER — KETOROLAC TROMETHAMINE 15 MG/ML IJ SOLN
15.0000 mg | Freq: Once | INTRAMUSCULAR | Status: AC
  Administered 2023-09-28: 15 mg via INTRAVENOUS
  Filled 2023-09-28: qty 1

## 2023-09-28 MED ORDER — IOHEXOL 300 MG/ML  SOLN
100.0000 mL | Freq: Once | INTRAMUSCULAR | Status: AC | PRN
Start: 2023-09-28 — End: 2023-09-28
  Administered 2023-09-28: 100 mL via INTRAVENOUS

## 2023-09-28 MED ORDER — LACTATED RINGERS IV BOLUS
1000.0000 mL | Freq: Once | INTRAVENOUS | Status: AC
  Administered 2023-09-28: 1000 mL via INTRAVENOUS

## 2023-09-28 MED ORDER — ONDANSETRON 4 MG PO TBDP
4.0000 mg | ORAL_TABLET | Freq: Three times a day (TID) | ORAL | 0 refills | Status: DC | PRN
Start: 2023-09-28 — End: 2023-11-09

## 2023-09-28 MED ORDER — OXYCODONE HCL 5 MG PO TABS: 5.0000 mg | ORAL_TABLET | Freq: Four times a day (QID) | ORAL | 0 refills | Status: DC | PRN

## 2023-09-28 NOTE — ED Provider Notes (Signed)
 [place] [facility] AT [facility] [place] Provider Note   CSN: [id number] Arrival date & time: 09/28/23  1013     Patient presents with: No chief complaint on file.   Abigail Robinson is a 73 y.o. female who presents with concern for nausea, vomiting, and diffuse abdominal pain after a fall off of her tractor 2 days ago.  States she fell backwards and hit the back of her head on the ground.  No loss of consciousness.  She sustained a laceration and was seen by [facility] emergency department where she underwent CT head and cervical spine which was unremarkable for acute abnormality.  Reports that since then, she has had about 7 episodes of emesis per day.  She feels generally nauseous.  Denies any headaches, vision changes, paresthesias in the arms or legs.  She also reports some generalized abdominal pain, but no acute worsening.  She is not on any anticoagulation.   HPI     Prior to Admission medications   Medication Sig Start Date End Date Taking? Authorizing Provider  acetaminophen  (TYLENOL ) 500 MG tablet Take 1,000 mg by mouth 2 (two) times daily as needed for moderate pain or headache.     [provider]  atenolol  (TENORMIN ) 50 MG tablet Take 1 tablet by mouth Daily. 10/24/11   [provider]  Calcium  Carb-Cholecalciferol 500-600 MG-UNIT TABS Take 1 tablet by mouth daily.    [provider]  celecoxib (CELEBREX) 200 MG capsule Take 200 mg by mouth daily. 01/07/20   [provider]  DULoxetine  (CYMBALTA ) 20 MG capsule Take 2 capsules (40 mg total) by mouth daily. 07/22/23   Claudene Arthea HERO, DO  gabapentin  (NEURONTIN ) 100 MG capsule Take 2 capsules (200 mg total) by mouth at bedtime. 04/02/23   Smith, Zachary M, DO  hydrochlorothiazide (MICROZIDE) 12.5 MG capsule Take 12.5 mg by mouth daily. 07/20/21   [provider]  levothyroxine  (SYNTHROID , LEVOTHROID) 75 MCG tablet Take 75 mcg by mouth at bedtime.      [provider]  losartan  (COZAAR ) 100 MG tablet Take 100 mg by mouth daily.    [provider]  meclizine  (ANTIVERT ) 25 MG tablet Take 1 tablet (25 mg total) by mouth 3 (three) times daily as needed for dizziness. 01/12/20   Roselyn Carlin NOVAK, MD  omeprazole (PRILOSEC) 20 MG capsule Take 20 mg by mouth daily as needed (for heartburn).     [provider]  ondansetron  (ZOFRAN -ODT) 4 MG disintegrating tablet Take 1 tablet (4 mg total) by mouth every 8 (eight) hours as needed for nausea or vomiting. 09/28/23  Yes Veta Palma, PA-C  oxyCODONE  (ROXICODONE ) 5 MG immediate release tablet Take 1 tablet (5 mg total) by mouth every 6 (six) hours as needed for severe pain (pain score 7-10) or breakthrough pain (Pain not controlled with tylenol ). 09/28/23  Yes Veta Palma, PA-C  predniSONE  (DELTASONE ) 20 MG tablet Take 1 tablet (20 mg total) by mouth daily with breakfast. 07/17/23   Claudene Arthea HERO, DO  predniSONE  (DELTASONE ) 50 MG tablet Take 1 tab 13 hours prior to your upcoming procedure Take 1 tab 7 hours prior to your upcoming procedure  Take 1 tab 1 hour prior to your procedure. 08/21/22   Mansouraty, Aloha Raddle., MD  Probiotic Product (PROBIOTIC PO) Take 1 capsule by mouth daily. Ultimate 16 strain w/ trace minerals & sos    [provider]  tiZANidine  (ZANAFLEX ) 2 MG tablet Take 1 tablet (2 mg  total) by mouth at bedtime. 04/06/22   Smith, Zachary M, DO  vitamin B-12 (CYANOCOBALAMIN ) 1000 MCG tablet Take 1,000 mcg by mouth daily.    [provider]  Vitamins A & D (VITAMIN A & D) 5000-400 UNITS CAPS Take 1 capsule by mouth daily.    [provider]    Allergies: Demerol [meperidine]    Review of Systems  Gastrointestinal:  Positive for abdominal pain, nausea and vomiting.  Neurological:  Negative for dizziness and headaches.    Updated Vital Signs BP 120/60   Pulse 73   Temp 98.2 F (36.8 C) (Oral)   Resp 16   Ht 5' 7 (1.702 m)    Wt 77.6 kg   SpO2 98%   BMI 26.78 kg/m   Physical Exam Vitals and nursing note reviewed.  Constitutional:      General: She is not in acute distress.    Appearance: She is well-developed.     Comments: No active vomiting  HENT:     Head: Normocephalic and atraumatic.     Comments: Left posterior scalp with 7 staples in place.  Well-healing laceration without surrounding erythema.  No pus drainage. Eyes:     Extraocular Movements: Extraocular movements intact.     Conjunctiva/sclera: Conjunctivae normal.     Pupils: Pupils are equal, round, and reactive to light.  Cardiovascular:     Rate and Rhythm: Normal rate and regular rhythm.     Heart sounds: No murmur heard.    Comments: 2+ radial pulse bilaterally Pulmonary:     Effort: Pulmonary effort is normal. No respiratory distress.     Breath sounds: Normal breath sounds.  Abdominal:     Palpations: Abdomen is soft.     Tenderness: There is no abdominal tenderness.     Comments: No overlying ecchymoses or skin changes.  No significant abdominal tenderness to palpation  Musculoskeletal:        General: No swelling.     Cervical back: Neck supple.     Comments: General No obvious deformity. No erythema, edema, contusions, open wounds   Palpation Non-tender to palpation of the clavicles,humerus, radius and ulna, carpal bones, 1st-5th metacarpals and phalanges bilaterally Non tender over the femur, patella, tibia or fibula bilaterally  Non-tender over the cervical, thoracic, or lumbar spinous processes. Non-tender to palpation of the paraspinal region of the back or chest wall diffusely  No tenderness of the pelvis diffusely  ROM Full ROM of shoulders bilaterally Full elbow, wrist, knee flexion and extension bilaterally Intact plantarflexion and dorsiflexion, hip flexion bilaterally  Sensation: Sensation intact throughout the bilateral upper and lower extremity  Strength: 5/5 strength with resisted elbow and wrist  flexion and extension bilaterally 5/5 strength with resisted knee flexion and extension and ankle plantarflexion and dorsiflexion bilaterally    Skin:    General: Skin is warm and dry.     Capillary Refill: Capillary refill takes less than 2 seconds.  Neurological:     General: No focal deficit present.     Mental Status: She is alert.  Psychiatric:        Mood and Affect: Mood normal.     (all labs ordered are listed, but only abnormal results are displayed) Labs Reviewed  LIPASE, BLOOD - Abnormal; Notable for the following components:      Result Value   Lipase >2,800 (*)    All other components within normal limits  COMPREHENSIVE METABOLIC PANEL WITH GFR - Abnormal; Notable for the following components:  Potassium 3.4 (*)    Glucose, Bld 144 (*)    AST 254 (*)    ALT 328 (*)    Alkaline Phosphatase 214 (*)    Total Bilirubin 1.7 (*)    Anion gap 16 (*)    All other components within normal limits  CBC - Abnormal; Notable for the following components:   WBC 17.4 (*)    Hemoglobin 15.5 (*)    All other components within normal limits  URINALYSIS, ROUTINE W REFLEX MICROSCOPIC - Abnormal; Notable for the following components:   Color, Urine AMBER (*)    Hgb urine dipstick TRACE (*)    Bilirubin Urine MODERATE (*)    Protein, ur 100 (*)    All other components within normal limits  URINALYSIS, MICROSCOPIC (REFLEX) - Abnormal; Notable for the following components:   Bacteria, UA FEW (*)    All other components within normal limits    EKG: None  Radiology: CT ABDOMEN PELVIS W CONTRAST Result Date: 09/28/2023 CLINICAL DATA:  Abdominal trauma, blunt Elevated lipase EXAM: CT ABDOMEN AND PELVIS WITH CONTRAST TECHNIQUE: Multidetector CT imaging of the abdomen and pelvis was performed using the standard protocol following bolus administration of intravenous contrast. RADIATION DOSE REDUCTION: This exam was performed according to the departmental dose-optimization program  which includes automated exposure control, adjustment of the mA and/or kV according to patient size and/or use of iterative reconstruction technique. CONTRAST:  OMNIPAQUE  IOHEXOL  300 MG/ML  SOLN COMPARISON:  September 05, 2023, September 27, 2005 FINDINGS: Lower chest: No focal airspace consolidation or pleural effusion.Mild cardiomegaly. Bibasilar atelectasis. Hepatobiliary: Unchanged cyst in the right hepatic lobe.Couple of small gallstones in the gallbladder neck. No wall thickening. Mild diffuse extrahepatic biliary ductal dilation again noted measuring up to 9 mm. Normal variant low insertion of the cystic duct on the distal CBD. There is apparent enhancement of the ampulla of Vater at the major papilla (axial 37-38), coronal 66). The portal veins are patent. Pancreas: Small hypodensity in the pancreatic neck again noted measuring 5 mm. Diffuse acute peripancreatic fluid surrounding the entire pancreatic parenchyma, extending into the anterior pararenal spaces bilaterally, and both paracolic gutters, more so on the left than the right. Acute fluid also extends into the root of the mesentery with adjacent mesenteric edema. No well-formed or drainable peripancreatic fluid collection. Spleen: Normal size. No mass. Adrenals/Urinary Tract: No adrenal masses. Subcentimeter hypodensity noted in the right kidney, too small to definitively characterize, but likely a small cyst. No nephrolithiasis or hydronephrosis. The urinary bladder is distended without focal abnormality. Stomach/Bowel: The stomach contains ingested material without focal abnormality. There is a punctate calculus layering in the third portion of the duodenal (axial 46, coronal 66). No small bowel wall thickening or inflammation. No small bowel obstruction.Normal appendix. sigmoid colonic diverticulosis. No changes of acute diverticulitis. Vascular/Lymphatic: No aortic aneurysm. Scattered aortoiliac atherosclerosis. Retroaortic left renal vein. No  intraabdominal or pelvic lymphadenopathy. Reproductive: Hysterectomy. No concerning adnexal mass.No free pelvic fluid. Other: No pneumoperitoneum. Musculoskeletal: No acute fracture or destructive lesion. IMPRESSION: 1. Acute interstitial edematous pancreatitis, likely gallstone pancreatitis. While the common bile duct is dilated and the ampulla appears inflamed, there is no obstructive choledocholith. A likely passed calculus is layering in the third portion of the duodenal (axial 46). Given the enhancement of the ampulla, nonemergent upper endoscopy in 6-12 weeks should be considered to exclude underlying neoplasm once patient is clinically able. 2. Cholecystolithiasis.  No changes of acute cholecystitis. 3. Scattered sigmoid diverticulosis. Electronically Signed  By: Rogelia Myers M.D.   On: 09/28/2023 13:12   CT Head Wo Contrast Result Date: 09/28/2023 CLINICAL DATA:  Head trauma, moderate-severe with vomiting EXAM: CT HEAD WITHOUT CONTRAST TECHNIQUE: Contiguous axial images were obtained from the base of the skull through the vertex without intravenous contrast. RADIATION DOSE REDUCTION: This exam was performed according to the departmental dose-optimization program which includes automated exposure control, adjustment of the mA and/or kV according to patient size and/or use of iterative reconstruction technique. COMPARISON:  CT of the head dated September 26, 2023. FINDINGS: Brain: Normal brain. No evidence of hemorrhage, mass, cortical infarct or hydrocephalus. Vascular: Mild calcific atheromatous disease within the carotid siphons. Skull: Intact and unremarkable. Sinuses/Orbits: Clear paranasal sinuses. Status post bilateral lens replacement. Other: None. IMPRESSION: Normal.  No evidence of acute traumatic injury. Electronically Signed   By: Evalene Coho M.D.   On: 09/28/2023 11:54     Procedures   Medications Ordered in the ED  ondansetron  (ZOFRAN ) injection 4 mg (4 mg Intravenous Given 09/28/23  1058)  lactated ringers  bolus 1,000 mL (0 mLs Intravenous Stopped 09/28/23 1326)  iohexol  (OMNIPAQUE ) 300 MG/ML solution 100 mL (100 mLs Intravenous Contrast Given 09/28/23 1228)  ketorolac  (TORADOL ) 15 MG/ML injection 15 mg (15 mg Intravenous Given 09/28/23 1250)  prochlorperazine  (COMPAZINE ) injection 10 mg (10 mg Intravenous Given 09/28/23 1252)                                    Medical Decision Making Amount and/or Complexity of Data Reviewed Labs: ordered. Radiology: ordered.  Risk Prescription drug management.     Differential diagnosis includes but is not limited to intracranial hemorrhage, concussion, musculoskeletal pain, bone contusion  ED Course:  Upon initial evaluation, patient is well-appearing, no acute distress.  Stable vitals.  Not actively vomiting on exam.  She is without any neurodeficits on exam.  Has a well-healing scalp laceration to the left posterior scalp.  No tenderness diffusely to the upper or lower extremities bilaterally.  Has full range of motion of the upper and lower extremities bilaterally.  No cervical, thoracic, or lumbar spinal tenderness to palpation. No abdominal tenderness to palpation on my exam, and no ecchymosis or other overlying skin changes. I have lower concern for acute intra-abdominal pathology at this time.  Although CT head obtained initially after this accident on 09/26/2023 was without acute abnormality, given patient reported recurrent emesis, will obtain repeat CT head to rule out slow intracranial hemorrhage. Otherwise, feel patient symptoms consistent with concussion. I discussed if I needed to obtain abdominal CT imaging with my attending Dr. Neysa. He recommends holding off on CT abdomen and pelvis at this time.   Labs Ordered: I Ordered, and personally interpreted labs.  The pertinent results include:  CBC with leukocytosis of 17.4 CMP with elevated AST, ALT, alk phos at 254, 328, 214 respectively.  T. bili elevated at 1.7.  Anion  gap slightly elevated at 16 Lipase elevated over 2800 Urinalysis with no nitrates or leukocytes.  Moderate amount of bilirubin and hemoglobin.  Imaging Studies ordered: I ordered imaging studies including CT abdomen and pelvis, CT head I independently visualized the imaging with scope of interpretation limited to determining acute life threatening conditions related to emergency care. CT head without acute abnormality CT abdomen pelvis with acute interstitial edematous pancreatitis, likely gallstone pancreatitis.  Mildly common bile duct is dilated and the ampulla appears inflamed, there is  no obstructive choledocholithiasis.  A likely passed calculus is layering in the third portion of the duodenum.  Given the enhancement of the ampulla, nonemergent upper endoscopy in 6 to 12 weeks should be considered to exclude underlying neoplasm once patient is clinically stable. Cholecystolithiasis.  No changes of acute cholecystitis.  Scattered sigmoid diverticulosis. I agree with the radiologist interpretation  Medications Given: LR bolus Toradol  Compazine  and Zofran   Upon re-evaluation, patient reports pain is better controlled with the Toradol .  Reports nausea has resolved and she has been able to tolerate ginger ale.  No further episodes of vomiting here in the emergency room.  We discussed that CT head is unremarkable for any intracranial hemorrhage or other acute process.  Given her head trauma, she could have a concussion that is contributing to her nausea and vomiting.  Her lipase was significantly elevated at over 2800 and CT abdomen pelvis with evidence of acute interstitial edematous pancreatitis. This seem to be from gallstone etiology. No evidence of complication such as necrosis or abscess.  Suspect this is the cause of her abdominal pain and nausea.  Pain and nausea are well-controlled here, stable vitals, tolerating PO intake, feel she is appropriate for discharge home at this time.  She  understands she will need to follow-up with her GI doctor, Dr. Wilhelmenia, within the next month to review her CT scan findings and discuss if the upper endoscopy would be recommended for her for further evaluation.  Impression: Acute pancreatitis Elevated LFTs Hypokalemia  Disposition:  The patient was discharged home with instructions to take tylenol  and prescribed oxycodone  as needed for abdominal pain. Increase fluid intake at home. Follow up with her GI doctor, Dr. Wilhelmenia, within the next month to discuss her CT scan findings and if she would need a endoscopy.  Recommended follow-up LFTs with Dr. Melodee.  Increase dietary intake of potassium at home. Return precautions given.    Record Review: External records from outside source obtained and reviewed including GI note/MRI      This chart was dictated using voice recognition software, Dragon. Despite the best efforts of this provider to proofread and correct errors, errors may still occur which can change documentation meaning.       Final diagnoses:  Acute biliary pancreatitis without infection or necrosis  Elevated LFTs  Hypokalemia    ED Discharge Orders          Ordered    oxyCODONE  (ROXICODONE ) 5 MG immediate release tablet  Every 6 hours PRN        09/28/23 1418    ondansetron  (ZOFRAN -ODT) 4 MG disintegrating tablet  Every 8 hours PRN        09/28/23 1418               Veta Palma, PA-C 09/28/23 1422    Neysa Caron PARAS, DO 09/28/23 1514

## 2023-09-28 NOTE — ED Notes (Signed)
 Patient transported to CT

## 2023-09-28 NOTE — Discharge Instructions (Addendum)
 You were found to have pancreatitis which is inflammation of your pancreas.  This is likely causing your abdominal pain and nausea. This should start to improve over the next 3-5 days with fluids and pain medications at home.  I have included your CT scan results below, please make your GI doctor aware of these findings within the next month. A follow-up endoscopy was recommended in the next 6-12 weeks to exclude an underlying pathology like a malignancy that could have caused your symptoms.   Please increase your intake of fluids at home.  You may take up to 1000mg  of tylenol  every 6 hours as needed for pain.  Do not take more then 4g per day.  You have been prescribed Oxycodone -this is a narcotic/controlled substance medication that has potential addicting qualities.  You may take 1 tablet every 6 hours as needed for severe pain.  Do not drive or operate heavy machinery when taking this medicine as it can be sedating. Do not drink alcohol  or take other sedating medications when taking this medicine for safety reasons.  Keep this out of reach of small children.    You have been prescribed Zofran  (ondansetron ) for nausea and vomiting. You may take this every 8 hours as needed for nausea and vomiting. This medication dissolves under the tongue. You do not need to swallow it.    Your liver enzymes were elevated here.  A liver cyst was noted on your CT scan which has been there previously.  Please make your GI doctor aware of this finding and have him continue to monitor your liver enzymes.  Your urine did not show any signs of infection.  You were found to have a slightly low potassium level on your labs today. Please increase your dietary intake of potassium with foods such as avocados, potatoes, bananas, spinach, salmon. Please have your PCP monitor this value.   The CT of your head did not show any abnormalities such as a brain bleed or skull fracture.  It is possible you have a concussion from  your injury.  Please decrease screen time and get plenty of sleep each night.  Please return to the emergency room for any worsening abdominal pain, uncontrolled vomiting, fevers, any other new or concerning symptoms  EXAM: CT ABDOMEN AND PELVIS WITH CONTRAST   TECHNIQUE: Multidetector CT imaging of the abdomen and pelvis was performed using the standard protocol following bolus administration of intravenous contrast.   RADIATION DOSE REDUCTION: This exam was performed according to the departmental dose-optimization program which includes automated exposure control, adjustment of the mA and/or kV according to patient size and/or use of iterative reconstruction technique.   CONTRAST:  OMNIPAQUE  IOHEXOL  300 MG/ML  SOLN   COMPARISON:  September 05, 2023, September 27, 2005   FINDINGS: Lower chest: No focal airspace consolidation or pleural effusion.Mild cardiomegaly. Bibasilar atelectasis.   Hepatobiliary: Unchanged cyst in the right hepatic lobe.Couple of small gallstones in the gallbladder neck. No wall thickening. Mild diffuse extrahepatic biliary ductal dilation again noted measuring up to 9 mm. Normal variant low insertion of the cystic duct on the distal CBD. There is apparent enhancement of the ampulla of Vater at the major papilla (axial 37-38), coronal 66). The portal veins are patent.   Pancreas: Small hypodensity in the pancreatic neck again noted measuring 5 mm. Diffuse acute peripancreatic fluid surrounding the entire pancreatic parenchyma, extending into the anterior pararenal spaces bilaterally, and both paracolic gutters, more so on the left than the right. Acute  fluid also extends into the root of the mesentery with adjacent mesenteric edema. No well-formed or drainable peripancreatic fluid collection.   Spleen: Normal size. No mass.   Adrenals/Urinary Tract: No adrenal masses. Subcentimeter hypodensity noted in the right kidney, too small to definitively  characterize, but likely a small cyst. No nephrolithiasis or hydronephrosis. The urinary bladder is distended without focal abnormality.   Stomach/Bowel: The stomach contains ingested material without focal abnormality. There is a punctate calculus layering in the third portion of the duodenal (axial 46, coronal 66). No small bowel wall thickening or inflammation. No small bowel obstruction.Normal appendix. sigmoid colonic diverticulosis. No changes of acute diverticulitis.   Vascular/Lymphatic: No aortic aneurysm. Scattered aortoiliac atherosclerosis. Retroaortic left renal vein. No intraabdominal or pelvic lymphadenopathy.   Reproductive: Hysterectomy. No concerning adnexal mass.No free pelvic fluid.   Other: No pneumoperitoneum.   Musculoskeletal: No acute fracture or destructive lesion.   IMPRESSION: 1. Acute interstitial edematous pancreatitis, likely gallstone pancreatitis. While the common bile duct is dilated and the ampulla appears inflamed, there is no obstructive choledocholith. A likely passed calculus is layering in the third portion of the duodenal (axial 46). Given the enhancement of the ampulla, nonemergent upper endoscopy in 6-12 weeks should be considered to exclude underlying neoplasm once patient is clinically able. 2. Cholecystolithiasis.  No changes of acute cholecystitis. 3. Scattered sigmoid diverticulosis.     Electronically Signed   By: Rogelia Myers M.D.   On: 09/28/2023 13:12

## 2023-09-28 NOTE — ED Triage Notes (Signed)
 Pt fell off the back of a tractor 2 days ago. She reports sustaining a laceration on the back of her head, pain to her L shoulder, and generalized abd pain with N/V. Denies confusion or abnormal behavior. Pt was evaluated in the ED the same day it happened and reports negative CT and XR imaging.

## 2023-09-28 NOTE — ED Notes (Signed)
 Unable to locate pt in lobby. Wheel chair with pt labels and blankets found unoccupied. Charge nurse notified.   Jereld Ghent, RN 09/28/23 [phone]

## 2023-10-01 ENCOUNTER — Telehealth: Payer: Self-pay | Admitting: Gastroenterology

## 2023-10-01 NOTE — Telephone Encounter (Signed)
 Inbound call from patient stated she would like to speak to nurse due to being to the ER twice in the last week. Patient is stating she's in extreme pain, feels dehydrated and states she needs gallbladder surgery. Informed patient if she wasn't feeling well to return to the ER but patient has denied due to not getting admitted the last time being there   Please advise  Thank you

## 2023-10-01 NOTE — Telephone Encounter (Signed)
 Dr Wilhelmenia the pt states she has been to the ED several times with no relief for abd pain and discomfort.  The pain is severe- she has an appt on 7/16 to see you in the office.  I see that her MRI states status post cholecystectomy despite the pt saying she has never had her gallbladder removed.  I looked at her most recent CT and it does mention her gallbladder.  FYI  She is at the PCP office now and will keep appt with our office unless PCP states it is not needed.

## 2023-10-02 ENCOUNTER — Other Ambulatory Visit: Payer: Self-pay | Admitting: Internal Medicine

## 2023-10-02 ENCOUNTER — Ambulatory Visit (INDEPENDENT_AMBULATORY_CARE_PROVIDER_SITE_OTHER)

## 2023-10-02 ENCOUNTER — Ambulatory Visit: Payer: Self-pay | Admitting: Gastroenterology

## 2023-10-02 ENCOUNTER — Other Ambulatory Visit

## 2023-10-02 ENCOUNTER — Other Ambulatory Visit: Payer: Self-pay

## 2023-10-02 ENCOUNTER — Inpatient Hospital Stay (HOSPITAL_BASED_OUTPATIENT_CLINIC_OR_DEPARTMENT_OTHER)
Admission: EM | Admit: 2023-10-02 | Discharge: 2023-10-08 | DRG: 439 | Disposition: A | Discharge status: Home Health | Source: Ambulatory Visit | Attending: Internal Medicine | Admitting: Internal Medicine

## 2023-10-02 ENCOUNTER — Telehealth: Payer: Self-pay | Admitting: Gastroenterology

## 2023-10-02 ENCOUNTER — Emergency Department (HOSPITAL_BASED_OUTPATIENT_CLINIC_OR_DEPARTMENT_OTHER)

## 2023-10-02 ENCOUNTER — Encounter (HOSPITAL_BASED_OUTPATIENT_CLINIC_OR_DEPARTMENT_OTHER): Payer: Self-pay | Admitting: Radiology

## 2023-10-02 DIAGNOSIS — E861 Hypovolemia: Secondary | ICD-10-CM | POA: Diagnosis not present

## 2023-10-02 DIAGNOSIS — K8591 Acute pancreatitis with uninfected necrosis, unspecified: Secondary | ICD-10-CM | POA: Diagnosis not present

## 2023-10-02 DIAGNOSIS — N182 Chronic kidney disease, stage 2 (mild): Secondary | ICD-10-CM | POA: Diagnosis not present

## 2023-10-02 DIAGNOSIS — Z90710 Acquired absence of both cervix and uterus: Secondary | ICD-10-CM | POA: Diagnosis not present

## 2023-10-02 DIAGNOSIS — E860 Dehydration: Secondary | ICD-10-CM | POA: Diagnosis not present

## 2023-10-02 DIAGNOSIS — E876 Hypokalemia: Secondary | ICD-10-CM | POA: Diagnosis present

## 2023-10-02 DIAGNOSIS — Z803 Family history of malignant neoplasm of breast: Secondary | ICD-10-CM

## 2023-10-02 DIAGNOSIS — Z833 Family history of diabetes mellitus: Secondary | ICD-10-CM | POA: Diagnosis not present

## 2023-10-02 DIAGNOSIS — Z90722 Acquired absence of ovaries, bilateral: Secondary | ICD-10-CM

## 2023-10-02 DIAGNOSIS — Z8051 Family history of malignant neoplasm of kidney: Secondary | ICD-10-CM | POA: Diagnosis not present

## 2023-10-02 DIAGNOSIS — R112 Nausea with vomiting, unspecified: Secondary | ICD-10-CM | POA: Diagnosis not present

## 2023-10-02 DIAGNOSIS — Z860100 Personal history of colon polyps, unspecified: Secondary | ICD-10-CM

## 2023-10-02 DIAGNOSIS — J90 Pleural effusion, not elsewhere classified: Secondary | ICD-10-CM | POA: Diagnosis present

## 2023-10-02 DIAGNOSIS — R0789 Other chest pain: Secondary | ICD-10-CM

## 2023-10-02 DIAGNOSIS — Z9181 History of falling: Secondary | ICD-10-CM

## 2023-10-02 DIAGNOSIS — R109 Unspecified abdominal pain: Secondary | ICD-10-CM

## 2023-10-02 DIAGNOSIS — R197 Diarrhea, unspecified: Secondary | ICD-10-CM | POA: Diagnosis not present

## 2023-10-02 DIAGNOSIS — Z8249 Family history of ischemic heart disease and other diseases of the circulatory system: Secondary | ICD-10-CM | POA: Diagnosis not present

## 2023-10-02 DIAGNOSIS — K760 Fatty (change of) liver, not elsewhere classified: Secondary | ICD-10-CM | POA: Diagnosis not present

## 2023-10-02 DIAGNOSIS — E871 Hypo-osmolality and hyponatremia: Secondary | ICD-10-CM | POA: Diagnosis present

## 2023-10-02 DIAGNOSIS — Z85828 Personal history of other malignant neoplasm of skin: Secondary | ICD-10-CM

## 2023-10-02 DIAGNOSIS — E039 Hypothyroidism, unspecified: Secondary | ICD-10-CM | POA: Diagnosis present

## 2023-10-02 DIAGNOSIS — K8020 Calculus of gallbladder without cholecystitis without obstruction: Secondary | ICD-10-CM | POA: Diagnosis present

## 2023-10-02 DIAGNOSIS — Z9079 Acquired absence of other genital organ(s): Secondary | ICD-10-CM

## 2023-10-02 DIAGNOSIS — G629 Polyneuropathy, unspecified: Secondary | ICD-10-CM | POA: Diagnosis not present

## 2023-10-02 DIAGNOSIS — I129 Hypertensive chronic kidney disease with stage 1 through stage 4 chronic kidney disease, or unspecified chronic kidney disease: Secondary | ICD-10-CM | POA: Diagnosis present

## 2023-10-02 DIAGNOSIS — K859 Acute pancreatitis without necrosis or infection, unspecified: Principal | ICD-10-CM | POA: Diagnosis present

## 2023-10-02 DIAGNOSIS — K851 Biliary acute pancreatitis without necrosis or infection: Secondary | ICD-10-CM | POA: Diagnosis not present

## 2023-10-02 DIAGNOSIS — Z79899 Other long term (current) drug therapy: Secondary | ICD-10-CM

## 2023-10-02 DIAGNOSIS — K838 Other specified diseases of biliary tract: Secondary | ICD-10-CM | POA: Diagnosis not present

## 2023-10-02 DIAGNOSIS — Z888 Allergy status to other drugs, medicaments and biological substances status: Secondary | ICD-10-CM

## 2023-10-02 DIAGNOSIS — E785 Hyperlipidemia, unspecified: Secondary | ICD-10-CM | POA: Diagnosis not present

## 2023-10-02 DIAGNOSIS — Z800 Family history of malignant neoplasm of digestive organs: Secondary | ICD-10-CM | POA: Diagnosis not present

## 2023-10-02 DIAGNOSIS — Z79890 Hormone replacement therapy (postmenopausal): Secondary | ICD-10-CM

## 2023-10-02 DIAGNOSIS — Z885 Allergy status to narcotic agent status: Secondary | ICD-10-CM

## 2023-10-02 DIAGNOSIS — K858 Other acute pancreatitis without necrosis or infection: Secondary | ICD-10-CM | POA: Diagnosis not present

## 2023-10-02 DIAGNOSIS — R188 Other ascites: Secondary | ICD-10-CM | POA: Diagnosis not present

## 2023-10-02 DIAGNOSIS — D72829 Elevated white blood cell count, unspecified: Secondary | ICD-10-CM | POA: Diagnosis not present

## 2023-10-02 DIAGNOSIS — K219 Gastro-esophageal reflux disease without esophagitis: Secondary | ICD-10-CM | POA: Diagnosis not present

## 2023-10-02 DIAGNOSIS — K8689 Other specified diseases of pancreas: Secondary | ICD-10-CM | POA: Diagnosis not present

## 2023-10-02 DIAGNOSIS — Z823 Family history of stroke: Secondary | ICD-10-CM

## 2023-10-02 DIAGNOSIS — Z801 Family history of malignant neoplasm of trachea, bronchus and lung: Secondary | ICD-10-CM

## 2023-10-02 LAB — COMPREHENSIVE METABOLIC PANEL WITH GFR
ALT: 48 U/L — ABNORMAL HIGH (ref 0–35)
AST: 29 U/L (ref 0–37)
Albumin: 3.4 g/dL — ABNORMAL LOW (ref 3.5–5.2)
Alkaline Phosphatase: 99 U/L (ref 39–117)
BUN: 15 mg/dL (ref 6–23)
CO2: 30 meq/L (ref 19–32)
Calcium: 8.7 mg/dL (ref 8.4–10.5)
Chloride: 90 meq/L — ABNORMAL LOW (ref 96–112)
Creatinine, Ser: 0.73 mg/dL (ref 0.40–1.20)
GFR: 80.98 mL/min (ref >60.00)
Glucose, Bld: 97 mg/dL (ref 70–99)
Potassium: 3.1 meq/L — ABNORMAL LOW (ref 3.5–5.1)
Sodium: 127 meq/L — ABNORMAL LOW (ref 135–145)
Total Bilirubin: 0.6 mg/dL (ref 0.2–1.2)
Total Protein: 6.6 g/dL (ref 6.0–8.3)

## 2023-10-02 LAB — PROTIME-INR
INR: 1.5 ratio — ABNORMAL HIGH (ref 0.8–1.0)
Prothrombin Time: 15.4 s — ABNORMAL HIGH (ref 9.6–13.1)

## 2023-10-02 LAB — AMYLASE: Amylase: 60 U/L (ref 27–131)

## 2023-10-02 LAB — CBC WITH DIFFERENTIAL/PLATELET
Basophils Absolute: 0 K/uL (ref 0.0–0.1)
Basophils Relative: 0.2 % (ref 0.0–3.0)
Eosinophils Absolute: 0.1 K/uL (ref 0.0–0.7)
Eosinophils Relative: 0.9 % (ref 0.0–5.0)
HCT: 42.5 % (ref 36.0–46.0)
Hemoglobin: 14.1 g/dL (ref 12.0–15.0)
Lymphocytes Relative: 8.8 % — ABNORMAL LOW (ref 12.0–46.0)
Lymphs Abs: 1.4 K/uL (ref 0.7–4.0)
MCHC: 33.1 g/dL (ref 30.0–36.0)
MCV: 93.5 fl (ref 78.0–100.0)
Monocytes Absolute: 1.1 K/uL — ABNORMAL HIGH (ref 0.1–1.0)
Monocytes Relative: 7.2 % (ref 3.0–12.0)
Neutro Abs: 13.2 K/uL — ABNORMAL HIGH (ref 1.4–7.7)
Neutrophils Relative %: 82.9 % — ABNORMAL HIGH (ref 43.0–77.0)
Platelets: 421 K/uL — ABNORMAL HIGH (ref 150.0–400.0)
RBC: 4.55 Mil/uL (ref 3.87–5.11)
RDW: 13.8 % (ref 11.5–15.5)
WBC: 15.9 K/uL — ABNORMAL HIGH (ref 4.0–10.5)

## 2023-10-02 LAB — MAGNESIUM: Magnesium: 1.9 mg/dL (ref 1.7–2.4)

## 2023-10-02 LAB — SEDIMENTATION RATE: Sed Rate: 86 mm/h — ABNORMAL HIGH (ref 0–30)

## 2023-10-02 LAB — LIPASE: Lipase: 64 U/L — ABNORMAL HIGH (ref 11.0–59.0)

## 2023-10-02 LAB — HIGH SENSITIVITY CRP: CRP, High Sensitivity: 177.75 mg/L — ABNORMAL HIGH (ref 0.000–5.000)

## 2023-10-02 MED ORDER — ACETAMINOPHEN 500 MG PO TABS
ORAL_TABLET | ORAL | Status: AC
  Filled 2023-10-02: qty 2

## 2023-10-02 MED ORDER — ACETAMINOPHEN 500 MG PO TABS
1000.0000 mg | ORAL_TABLET | Freq: Once | ORAL | Status: AC
  Administered 2023-10-02: 1000 mg via ORAL

## 2023-10-02 MED ORDER — MORPHINE SULFATE (PF) 4 MG/ML IV SOLN
4.0000 mg | Freq: Once | INTRAVENOUS | Status: AC
  Administered 2023-10-03: 4 mg via INTRAVENOUS
  Filled 2023-10-02: qty 1

## 2023-10-02 MED ORDER — SODIUM CHLORIDE 0.9 % IV BOLUS
1000.0000 mL | Freq: Once | INTRAVENOUS | Status: AC
  Administered 2023-10-03: 1000 mL via INTRAVENOUS

## 2023-10-02 MED ORDER — POTASSIUM CHLORIDE 10 MEQ/100ML IV SOLN
10.0000 meq | INTRAVENOUS | Status: AC
  Administered 2023-10-03 (×2): 10 meq via INTRAVENOUS
  Filled 2023-10-02 (×2): qty 100

## 2023-10-02 MED ORDER — IOHEXOL 300 MG/ML  SOLN
100.0000 mL | Freq: Once | INTRAMUSCULAR | Status: AC | PRN
Start: 2023-10-02 — End: 2023-10-02
  Administered 2023-10-02: 100 mL via INTRAVENOUS

## 2023-10-02 MED ORDER — POTASSIUM CHLORIDE CRYS ER 20 MEQ PO TBCR
40.0000 meq | EXTENDED_RELEASE_TABLET | Freq: Once | ORAL | Status: AC
  Administered 2023-10-02: 40 meq via ORAL
  Filled 2023-10-02: qty 2

## 2023-10-02 MED ORDER — ONDANSETRON HCL 4 MG/2ML IJ SOLN
4.0000 mg | Freq: Once | INTRAMUSCULAR | Status: AC
  Administered 2023-10-03: 4 mg via INTRAVENOUS
  Filled 2023-10-02: qty 2

## 2023-10-02 MED ORDER — SODIUM CHLORIDE 0.9 % IV BOLUS
1000.0000 mL | Freq: Once | INTRAVENOUS | Status: AC
  Administered 2023-10-02: 1000 mL via INTRAVENOUS

## 2023-10-02 NOTE — Telephone Encounter (Signed)
 Review of patient's labs show evidence of persisting leukocytosis as well as progressive hypokalemia and hyponatremia. Her labs also indicate high inflammatory markers. She also has an elevation in INR suggestive of malnutrition or potential of liver insufficiency (more likely malnutrition and vitamin K deficiency) Her liver biochemical testing has improved. Suspect that this was gallstone pancreatitis, though traumatic pancreatitis could still be in the differential diagnosis. She is not been able to eat until today very minimal amounts. At this point, patient needs to come into the hospital for further evaluation. I have spoken with my on-call provider so they are aware that patient is coming in in case there are questions that need to be had for inpatient team. She can be seen at [facility] or [facility] as needed under observation or admission status with medicine. I think she needs updated labs, electrolyte supplementation, and likely an updated CT scan to see what has developed in regards to peripancreatic fluid collections or pancreatitis edema.  I was able to finally get a hold of the patient and she agrees with this plan of action. She is going to go to likely [facility] or [place].   Aloha Finner, MD [facility] Office # [phone]

## 2023-10-02 NOTE — ED Notes (Signed)
 Pt to bathroom at this time

## 2023-10-02 NOTE — ED Provider Notes (Signed)
 [place] [facility] AT [facility] Provider Note   CSN: [id number] Arrival date & time: 10/02/23  1735     Patient presents with: Abdominal Pain   Abigail Robinson is a 73 y.o. female.   Patient is a 72 year old female presenting for epigastric abdominal pain.  Patient is epigastric abdominal pain that radiates straight to the back with associated nausea and vomiting over the last 6 days.  On 09/28/2023 she was seen in the emergency department and was diagnosed with pancreatitis with a lipase level of 2800.  She was sent home and returned after the recommendations of her GI specialist for continued abdominal pain, nausea, vomiting with outpatient laboratory studies suggesting hyponatremia and hypokalemia.  He also ordered inflammatory markers including CPK that was elevated.  Requesting repeat imaging at this time.   The history is provided by the patient. No language interpreter was used.  Abdominal Pain Associated symptoms: nausea and vomiting   Associated symptoms: no chest pain, no chills, no cough, no dysuria, no fever, no hematuria, no shortness of breath and no sore throat        Prior to Admission medications   Medication Sig Start Date End Date Taking? Authorizing Provider  acetaminophen  (TYLENOL ) 500 MG tablet Take 1,000 mg by mouth 2 (two) times daily as needed for moderate pain or headache.     [provider]  atenolol  (TENORMIN ) 50 MG tablet Take 1 tablet by mouth Daily. 10/24/11   [provider]  Calcium  Carb-Cholecalciferol 500-600 MG-UNIT TABS Take 1 tablet by mouth daily.    [provider]  celecoxib (CELEBREX) 200 MG capsule Take 200 mg by mouth daily. 01/07/20   [provider]  DULoxetine  (CYMBALTA ) 20 MG capsule Take 2 capsules (40 mg total) by mouth daily. 07/22/23   Claudene Arthea HERO, DO  gabapentin  (NEURONTIN ) 100 MG capsule Take 2 capsules (200 mg total) by mouth at bedtime. 04/02/23   Smith, Zachary M, DO   hydrochlorothiazide (MICROZIDE) 12.5 MG capsule Take 12.5 mg by mouth daily. 07/20/21   [provider]  levothyroxine  (SYNTHROID , LEVOTHROID) 75 MCG tablet Take 75 mcg by mouth at bedtime.     [provider]  losartan  (COZAAR ) 100 MG tablet Take 100 mg by mouth daily.    [provider]  meclizine  (ANTIVERT ) 25 MG tablet Take 1 tablet (25 mg total) by mouth 3 (three) times daily as needed for dizziness. 01/12/20   Roselyn Carlin NOVAK, MD  omeprazole (PRILOSEC) 20 MG capsule Take 20 mg by mouth daily as needed (for heartburn).     [provider]  ondansetron  (ZOFRAN -ODT) 4 MG disintegrating tablet Take 1 tablet (4 mg total) by mouth every 8 (eight) hours as needed for nausea or vomiting. 09/28/23   Veta Palma, PA-C  oxyCODONE  (ROXICODONE ) 5 MG immediate release tablet Take 1 tablet (5 mg total) by mouth every 6 (six) hours as needed for severe pain (pain score 7-10) or breakthrough pain (Pain not controlled with tylenol ). 09/28/23   Veta Palma, PA-C  predniSONE  (DELTASONE ) 20 MG tablet Take 1 tablet (20 mg total) by mouth daily with breakfast. 07/17/23   Claudene Arthea HERO, DO  predniSONE  (DELTASONE ) 50 MG tablet Take 1 tab 13 hours prior to your upcoming procedure Take 1 tab 7 hours prior to your upcoming procedure  Take 1 tab 1 hour prior to your procedure. 08/21/22   Mansouraty, Aloha Raddle., MD  Probiotic Product (PROBIOTIC PO) Take 1 capsule by mouth daily. Ultimate  16 strain w/ trace minerals & sos    [provider]  tiZANidine  (ZANAFLEX ) 2 MG tablet Take 1 tablet (2 mg total) by mouth at bedtime. 04/06/22   Smith, Zachary M, DO  vitamin B-12 (CYANOCOBALAMIN ) 1000 MCG tablet Take 1,000 mcg by mouth daily.    [provider]  Vitamins A & D (VITAMIN A & D) 5000-400 UNITS CAPS Take 1 capsule by mouth daily.    [provider]    Allergies: Demerol [meperidine]    Review of Systems  Constitutional:  Negative for chills and  fever.  HENT:  Negative for ear pain and sore throat.   Eyes:  Negative for pain and visual disturbance.  Respiratory:  Negative for cough and shortness of breath.   Cardiovascular:  Negative for chest pain and palpitations.  Gastrointestinal:  Positive for abdominal pain, nausea and vomiting.  Genitourinary:  Negative for dysuria and hematuria.  Musculoskeletal:  Negative for arthralgias and back pain.  Skin:  Negative for color change and rash.  Neurological:  Negative for seizures and syncope.  All other systems reviewed and are negative.   Updated Vital Signs BP 127/64 (BP Location: Right Arm)   Pulse 88   Temp 98.1 F (36.7 C) (Oral)   Resp 18   Ht 5' 7 (1.702 m)   Wt 78 kg   SpO2 93%   BMI 26.94 kg/m   Physical Exam Vitals and nursing note reviewed.  Constitutional:      General: She is not in acute distress.    Appearance: She is well-developed.  HENT:     Head: Normocephalic and atraumatic.  Eyes:     Conjunctiva/sclera: Conjunctivae normal.  Cardiovascular:     Rate and Rhythm: Normal rate and regular rhythm.     Heart sounds: No murmur heard. Pulmonary:     Effort: Pulmonary effort is normal. No respiratory distress.     Breath sounds: Normal breath sounds.  Abdominal:     Palpations: Abdomen is soft.     Tenderness: There is generalized abdominal tenderness and tenderness in the right upper quadrant. There is no guarding or rebound.  Musculoskeletal:        General: No swelling.     Cervical back: Neck supple.  Skin:    General: Skin is warm and dry.     Capillary Refill: Capillary refill takes less than 2 seconds.  Neurological:     Mental Status: She is alert.  Psychiatric:        Mood and Affect: Mood normal.     (all labs ordered are listed, but only abnormal results are displayed) Labs Reviewed  MAGNESIUM  URINALYSIS, ROUTINE W REFLEX MICROSCOPIC    EKG: None  Radiology: No results found.   Procedures   Medications Ordered in the  ED  sodium chloride  0.9 % bolus 1,000 mL (has no administration in time range)  potassium chloride  10 mEq in 100 mL IVPB (has no administration in time range)  ondansetron  (ZOFRAN ) injection 4 mg (has no administration in time range)  morphine  (PF) 4 MG/ML injection 4 mg (has no administration in time range)  acetaminophen  (TYLENOL ) tablet 1,000 mg ( Oral Not Given 10/02/23 2010)  potassium chloride  SA (KLOR-CON  M) CR tablet 40 mEq (40 mEq Oral Given 10/02/23 2151)  sodium chloride  0.9 % bolus 1,000 mL (0 mLs Intravenous Stopped 10/02/23 2255)  iohexol  (OMNIPAQUE ) 300 MG/ML solution 100 mL (100 mLs Intravenous Contrast Given 10/02/23 2303)  Medical Decision Making Amount and/or Complexity of Data Reviewed Labs: ordered. Radiology: ordered.  Risk OTC drugs. Prescription drug management.   73 year old female presenting for epigastric abdominal pain, nausea, vomiting with recent diagnosis of pancreatitis on 09/28/2023.  Exam patient is alert and oriented x 3, no acute distress, afebrile, stable to signs.  Physical exam demonstrates tenderness palpation of the epigastric and right upper quadrant regions.  Guarding present.  No distention or rigidity.  Laboratory studies concerning for hyponatremia of 127 down from 139 on 09/28/2023-IV fluids given, hypokalemia 3.1-IV potassium ordered.  Lipase is currently downtrending from 2800 on 711 2025 to 64 today.  Leukocytosis is downtrending from 17.4 to 15.9 today.  Inflammatory marker CRP ordered by GI team 177.  CT abdomen pelvis ordered and pending.  Patient signed out to oncoming provider.  Medication given for pain control.  Given for nausea and vomiting.     Final diagnoses:  None    ED Discharge Orders     None          Elnor Bernarda SQUIBB, DO 10/02/23 2352

## 2023-10-02 NOTE — ED Notes (Signed)
Urine sent to lab for hold °

## 2023-10-02 NOTE — Telephone Encounter (Signed)
 Have patient come in for CBC/CMP/amylase/lipase/INR/ESR/CRP. Need to see what her labs look like today coming into tomorrow. She had pancreatitis so if she still having significant pain and discomfort she may end up needing to be sent back to the emergency department. I like to see what has trended over the last few days. Please also have her do a 2 view KUB. Also prepare her, when you talk with her, that when we see her, she may need an updated CT scan to be done to see what has occurred in regards to pancreatitis, but lets start with the labs and hopefully get those on Tuesday. Thanks. GM

## 2023-10-02 NOTE — Telephone Encounter (Signed)
 The pt has been advised and will have labs and Xray today Orders entered

## 2023-10-02 NOTE — ED Triage Notes (Signed)
 Pt had a follow up visit at her primary care today for her ed visit on the 11th. Pt endorses feeling bad. States she feels like she is going to throw up but has not. She was seen at primary care and told she had pancreatitis with abnormal labs. Labs are visible in the chart resulted at 16:28. Pt does endorse continued abd and back pain from her fall on the 11th. She states that her pain is unbearable and she is out of her oxycodone .

## 2023-10-02 NOTE — ED Notes (Signed)
 Patient transported to CT

## 2023-10-03 ENCOUNTER — Ambulatory Visit: Admitting: Gastroenterology

## 2023-10-03 DIAGNOSIS — Z833 Family history of diabetes mellitus: Secondary | ICD-10-CM | POA: Diagnosis not present

## 2023-10-03 DIAGNOSIS — K851 Biliary acute pancreatitis without necrosis or infection: Secondary | ICD-10-CM | POA: Diagnosis present

## 2023-10-03 DIAGNOSIS — N182 Chronic kidney disease, stage 2 (mild): Secondary | ICD-10-CM | POA: Diagnosis present

## 2023-10-03 DIAGNOSIS — Z9079 Acquired absence of other genital organ(s): Secondary | ICD-10-CM | POA: Diagnosis not present

## 2023-10-03 DIAGNOSIS — Z90722 Acquired absence of ovaries, bilateral: Secondary | ICD-10-CM | POA: Diagnosis not present

## 2023-10-03 DIAGNOSIS — Z79890 Hormone replacement therapy (postmenopausal): Secondary | ICD-10-CM | POA: Diagnosis not present

## 2023-10-03 DIAGNOSIS — K838 Other specified diseases of biliary tract: Secondary | ICD-10-CM | POA: Diagnosis not present

## 2023-10-03 DIAGNOSIS — E860 Dehydration: Secondary | ICD-10-CM | POA: Diagnosis present

## 2023-10-03 DIAGNOSIS — Z888 Allergy status to other drugs, medicaments and biological substances status: Secondary | ICD-10-CM | POA: Diagnosis not present

## 2023-10-03 DIAGNOSIS — Z90710 Acquired absence of both cervix and uterus: Secondary | ICD-10-CM | POA: Diagnosis not present

## 2023-10-03 DIAGNOSIS — E876 Hypokalemia: Secondary | ICD-10-CM | POA: Diagnosis present

## 2023-10-03 DIAGNOSIS — E039 Hypothyroidism, unspecified: Secondary | ICD-10-CM

## 2023-10-03 DIAGNOSIS — E861 Hypovolemia: Secondary | ICD-10-CM | POA: Diagnosis present

## 2023-10-03 DIAGNOSIS — D72829 Elevated white blood cell count, unspecified: Secondary | ICD-10-CM

## 2023-10-03 DIAGNOSIS — K859 Acute pancreatitis without necrosis or infection, unspecified: Secondary | ICD-10-CM | POA: Diagnosis present

## 2023-10-03 DIAGNOSIS — I129 Hypertensive chronic kidney disease with stage 1 through stage 4 chronic kidney disease, or unspecified chronic kidney disease: Secondary | ICD-10-CM | POA: Diagnosis present

## 2023-10-03 DIAGNOSIS — E871 Hypo-osmolality and hyponatremia: Secondary | ICD-10-CM | POA: Diagnosis present

## 2023-10-03 DIAGNOSIS — K8689 Other specified diseases of pancreas: Secondary | ICD-10-CM | POA: Diagnosis not present

## 2023-10-03 DIAGNOSIS — E785 Hyperlipidemia, unspecified: Secondary | ICD-10-CM | POA: Diagnosis present

## 2023-10-03 DIAGNOSIS — Z8051 Family history of malignant neoplasm of kidney: Secondary | ICD-10-CM | POA: Diagnosis not present

## 2023-10-03 DIAGNOSIS — K219 Gastro-esophageal reflux disease without esophagitis: Secondary | ICD-10-CM

## 2023-10-03 DIAGNOSIS — G629 Polyneuropathy, unspecified: Secondary | ICD-10-CM | POA: Diagnosis present

## 2023-10-03 DIAGNOSIS — Z800 Family history of malignant neoplasm of digestive organs: Secondary | ICD-10-CM | POA: Diagnosis not present

## 2023-10-03 DIAGNOSIS — J90 Pleural effusion, not elsewhere classified: Secondary | ICD-10-CM | POA: Diagnosis present

## 2023-10-03 DIAGNOSIS — Z885 Allergy status to narcotic agent status: Secondary | ICD-10-CM | POA: Diagnosis not present

## 2023-10-03 DIAGNOSIS — H8110 Benign paroxysmal vertigo, unspecified ear: Secondary | ICD-10-CM | POA: Insufficient documentation

## 2023-10-03 DIAGNOSIS — Z85828 Personal history of other malignant neoplasm of skin: Secondary | ICD-10-CM | POA: Diagnosis not present

## 2023-10-03 DIAGNOSIS — Z8249 Family history of ischemic heart disease and other diseases of the circulatory system: Secondary | ICD-10-CM | POA: Diagnosis not present

## 2023-10-03 DIAGNOSIS — Z860100 Personal history of colon polyps, unspecified: Secondary | ICD-10-CM | POA: Diagnosis not present

## 2023-10-03 LAB — COMPREHENSIVE METABOLIC PANEL WITH GFR
ALT: 38 U/L (ref 0–44)
AST: 31 U/L (ref 15–41)
Albumin: 2.3 g/dL — ABNORMAL LOW (ref 3.5–5.0)
Alkaline Phosphatase: 97 U/L (ref 38–126)
Anion gap: 11 (ref 5–15)
BUN: 9 mg/dL (ref 8–23)
CO2: 24 mmol/L (ref 22–32)
Calcium: 8.4 mg/dL — ABNORMAL LOW (ref 8.9–10.3)
Chloride: 96 mmol/L — ABNORMAL LOW (ref 98–111)
Creatinine, Ser: 0.63 mg/dL (ref 0.44–1.00)
GFR, Estimated: >60 mL/min (ref >60)
Glucose, Bld: 92 mg/dL (ref 70–99)
Potassium: 3.4 mmol/L — ABNORMAL LOW (ref 3.5–5.1)
Sodium: 131 mmol/L — ABNORMAL LOW (ref 135–145)
Total Bilirubin: 0.6 mg/dL (ref 0.0–1.2)
Total Protein: 5.9 g/dL — ABNORMAL LOW (ref 6.5–8.1)

## 2023-10-03 LAB — CBC
HCT: 39.6 % (ref 36.0–46.0)
Hemoglobin: 13.8 g/dL (ref 12.0–15.0)
MCH: 31.5 pg (ref 26.0–34.0)
MCHC: 34.8 g/dL (ref 30.0–36.0)
MCV: 90.4 fL (ref 80.0–100.0)
Platelets: 415 K/uL — ABNORMAL HIGH (ref 150–400)
RBC: 4.38 MIL/uL (ref 3.87–5.11)
RDW: 13.4 % (ref 11.5–15.5)
WBC: 21.3 K/uL — ABNORMAL HIGH (ref 4.0–10.5)
nRBC: 0 % (ref 0.0–0.2)

## 2023-10-03 LAB — PROTIME-INR
INR: 1.4 — ABNORMAL HIGH (ref 0.8–1.2)
Prothrombin Time: 17.7 s — ABNORMAL HIGH (ref 11.4–15.2)

## 2023-10-03 LAB — OSMOLALITY: Osmolality: 271 mosm/kg — ABNORMAL LOW (ref 275–295)

## 2023-10-03 MED ORDER — PANTOPRAZOLE SODIUM 40 MG PO TBEC
40.0000 mg | DELAYED_RELEASE_TABLET | Freq: Every day | ORAL | Status: DC
  Administered 2023-10-03 – 2023-10-05 (×3): 40 mg via ORAL
  Filled 2023-10-03 (×4): qty 1

## 2023-10-03 MED ORDER — ACETAMINOPHEN 650 MG RE SUPP
650.0000 mg | Freq: Four times a day (QID) | RECTAL | Status: DC | PRN
  Administered 2023-10-06 – 2023-10-07 (×2): 650 mg via RECTAL
  Filled 2023-10-03 (×2): qty 1

## 2023-10-03 MED ORDER — LACTATED RINGERS IV SOLN: INTRAVENOUS | Status: AC

## 2023-10-03 MED ORDER — ENOXAPARIN SODIUM 40 MG/0.4ML IJ SOSY
40.0000 mg | PREFILLED_SYRINGE | INTRAMUSCULAR | Status: DC
  Administered 2023-10-04 – 2023-10-07 (×4): 40 mg via SUBCUTANEOUS
  Filled 2023-10-03 (×5): qty 0.4

## 2023-10-03 MED ORDER — ENOXAPARIN SODIUM 40 MG/0.4ML IJ SOSY: 40.0000 mg | PREFILLED_SYRINGE | INTRAMUSCULAR | Status: DC

## 2023-10-03 MED ORDER — POLYETHYLENE GLYCOL 3350 17 G PO PACK: 17.0000 g | PACK | Freq: Every day | ORAL | Status: DC | PRN

## 2023-10-03 MED ORDER — DULOXETINE HCL 20 MG PO CPEP
40.0000 mg | ORAL_CAPSULE | Freq: Every day | ORAL | Status: DC
  Administered 2023-10-04 – 2023-10-08 (×5): 40 mg via ORAL
  Filled 2023-10-03 (×5): qty 2

## 2023-10-03 MED ORDER — PANTOPRAZOLE SODIUM 40 MG PO TBEC: 40.0000 mg | DELAYED_RELEASE_TABLET | Freq: Every day | ORAL | Status: DC

## 2023-10-03 MED ORDER — HYDROMORPHONE HCL 1 MG/ML IJ SOLN
0.5000 mg | INTRAMUSCULAR | Status: DC | PRN
  Administered 2023-10-04 (×2): 0.5 mg via INTRAVENOUS
  Filled 2023-10-03 (×3): qty 1

## 2023-10-03 MED ORDER — GABAPENTIN 100 MG PO CAPS
100.0000 mg | ORAL_CAPSULE | Freq: Every day | ORAL | Status: DC
  Administered 2023-10-03 – 2023-10-07 (×5): 100 mg via ORAL
  Filled 2023-10-03 (×5): qty 1

## 2023-10-03 MED ORDER — LEVOTHYROXINE SODIUM 75 MCG PO TABS
75.0000 ug | ORAL_TABLET | Freq: Every day | ORAL | Status: DC
  Administered 2023-10-03 – 2023-10-07 (×5): 75 ug via ORAL
  Filled 2023-10-03 (×2): qty 1
  Filled 2023-10-03: qty 3
  Filled 2023-10-03 (×2): qty 1

## 2023-10-03 MED ORDER — ATENOLOL 50 MG PO TABS
25.0000 mg | ORAL_TABLET | Freq: Every day | ORAL | Status: DC
  Administered 2023-10-04 – 2023-10-07 (×4): 25 mg via ORAL
  Filled 2023-10-03 (×5): qty 1

## 2023-10-03 MED ORDER — ACETAMINOPHEN 325 MG PO TABS
650.0000 mg | ORAL_TABLET | Freq: Four times a day (QID) | ORAL | Status: DC | PRN
Start: 2023-10-03 — End: 2023-10-08
  Administered 2023-10-03 – 2023-10-08 (×10): 650 mg via ORAL
  Filled 2023-10-03 (×10): qty 2

## 2023-10-03 MED ORDER — ACETAMINOPHEN 325 MG PO TABS
650.0000 mg | ORAL_TABLET | Freq: Once | ORAL | Status: AC
  Administered 2023-10-03: 650 mg via ORAL
  Filled 2023-10-03: qty 2

## 2023-10-03 MED ORDER — SODIUM CHLORIDE 0.9% FLUSH
3.0000 mL | Freq: Two times a day (BID) | INTRAVENOUS | Status: DC
  Administered 2023-10-03 – 2023-10-07 (×9): 3 mL via INTRAVENOUS

## 2023-10-03 MED ORDER — ACETAMINOPHEN 500 MG PO TABS
1000.0000 mg | ORAL_TABLET | Freq: Once | ORAL | Status: AC
  Administered 2023-10-03: 1000 mg via ORAL
  Filled 2023-10-03: qty 2

## 2023-10-03 NOTE — ED Provider Notes (Signed)
  Physical Exam  BP 127/64 (BP Location: Right Arm)   Pulse 88   Temp 98.1 F (36.7 C) (Oral)   Resp 18   Ht 5' 7 (1.702 m)   Wt 78 kg   SpO2 93%   BMI 26.94 kg/m   Physical Exam Vitals and nursing note reviewed.  Constitutional:      Appearance: She is well-developed.  Pulmonary:     Effort: Pulmonary effort is normal.  Neurological:     Mental Status: She is alert.     Procedures  Procedures  ED Course / MDM    Medical Decision Making Amount and/or Complexity of Data Reviewed Labs: ordered. Radiology: ordered.  Risk OTC drugs. Prescription drug management.   Patient presenting with complaints of abdominal pain as described in previous providers note.  Care was signed out to me awaiting results of his CT scan.  This has resulted and shows peripancreatic inflammation consistent with acute pancreatitis.  She also has some dilatation of the common bile duct and cholelithiasis.  I feel as though patient will require admission for further workup.  I have spoken with the hospitalist who agrees to admit.       Geroldine Berg, MD 10/03/23 [phone]

## 2023-10-03 NOTE — H&P (Signed)
 History and Physical   Abigail Robinson FMW:[id number] DOB: December 11, 1950 DOA: 10/02/2023  PCP: Stephane Leita DEL, MD   Patient coming from: Home  Chief Complaint: Abdominal pain  HPI: Abigail Robinson is a 73 y.o. female with medical history significant of hypertension, hyperlipidemia, GERD, CKD 2, hypothyroidism, BPPV, neuropathy, hysterectomy, DDD presenting with ongoing abdominal pain.  Patient has had abdominal pain with other associated symptoms for 6 days or so.  Was initially seen in the ED on 7/11 and was diagnosed with pancreatitis sent home with plan for close follow-up.  Her abdominal pain, nausea, vomiting persisted and she spoke with her GI provider who arranged for outpatient labs.  Outpatient labs came back abnormal and this combined with patient's continued symptoms GI team recommended patient return to the ED for further evaluation and treatment.  As above patient has had ongoing nausea vomiting abdominal pain.  Denies fevers, chills, chest pain, shortness of breath, constipation, diarrhea.  Adds that 2 weeks ago she had an episode of dehydration with some transient clay colored stools and dark urine that had improved prior to abdominal pain starting.  Also reports having some unusual visual changes with seeing unusual colors.  And when she closes her eyes she visualizes people and animals and other things.  Does not see these things when her eyes are open.  ED Course: Vital signs in the ED and remained stable.  Outpatient lab workup significant for CMP with sodium 127, potassium 3.1, chloride 98, ALT 48, albumin 3.4.  Outpatient CBC showed leukocytosis to 15.9, but at 421.  Outpatient PT/INR showed PT 15.4 and INR 1.5.  Outpatient amylase normal, outpatient lipase 64.  Outpatient ESR 64, outpatient CRP 177.  In the ED magnesium was checked which was normal and urinalysis is pending.  CT ordered which showed changes consistent with pancreatitis which had progressed compared to prior  images on 7/11.  There was evidence of cholelithiasis without cholecystitis.  Also noted was wall thickening of the stomach and duodenum likely reactive secondary to pancreatitis.  Patient received Tylenol , morphine , Zofran , 2 L IV fluids, 20 mEq IV potassium.  Outpatient GI team had notified inpatient team for patient's admission.  Review of Systems: As per HPI otherwise all other systems reviewed and are negative.  Past Medical History:  Diagnosis Date   Allergy    Arthritis    back, hands   Basal cell carcinoma    face   Colon polyps    Complication of anesthesia    prolonged sedation, slow to wake up   Diplopia    Endometriosis    GERD (gastroesophageal reflux disease)    Headache    Migraines   Hyperlipidemia    Hypertension    Hypothyroidism    Skin cancer    hx of   Status post dilation of esophageal narrowing    Thalamic cyst    Thyroid  disease    Vertigo     Past Surgical History:  Procedure Laterality Date   ANTERIOR AND POSTERIOR REPAIR WITH SACROSPINOUS FIXATION N/A 07/08/2015   Procedure: ANTERIOR AND POSTERIOR REPAIR ;  Surgeon: Alm Cook, MD;  Location: [facility];  Service: Gynecology;  Laterality: N/A;   COLONOSCOPY     DIAGNOSTIC LAPAROSCOPY     LAPAROSCOPIC VAGINAL HYSTERECTOMY WITH SALPINGO OOPHORECTOMY Bilateral 07/08/2015   Procedure: LAPAROSCOPIC ASSISTED VAGINAL HYSTERECTOMY WITH BILATERAL SALPINGO OOPHORECTOMY;  Surgeon: Alm Cook, MD;  Location: [facility];  Service: Gynecology;  Laterality: Bilateral;   OOPHORECTOMY  age 27  for endometrosis    Social History  reports that she has never smoked. She has never used smokeless tobacco. She reports that she does not currently use alcohol . She reports that she does not use drugs.  Allergies  Allergen Reactions   Demerol [Meperidine] Nausea Only and Other (See Comments)    Reaction=headache   Gadolinium Dermatitis    Family History  Problem Relation Age of Onset   Liver cancer Mother    Kidney  cancer Mother    Lung cancer Mother    Heart disease Father    Hypertension Father    Cirrhosis Father    Macular degeneration Father    Diabetes Brother    Heart attack Brother    Breast cancer Maternal Grandmother    Stroke Maternal Grandmother    Colon cancer Neg Hx    Esophageal cancer Neg Hx    Stomach cancer Neg Hx   Reviewed on admission  Prior to Admission medications   Medication Sig Start Date End Date Taking? Authorizing Provider  acetaminophen  (TYLENOL ) 500 MG tablet Take 1,000 mg by mouth 2 (two) times daily as needed for moderate pain or headache.    Yes [provider]  ascorbic acid (VITAMIN C) 1000 MG tablet Take 1,000 mg by mouth daily.   Yes [provider]  atenolol  (TENORMIN ) 25 MG tablet Take 25 mg by mouth daily. 08/04/23  Yes [provider]  Calcium  Carb-Cholecalciferol 500-600 MG-UNIT TABS Take 1 tablet by mouth daily.   Yes [provider]  DULoxetine  (CYMBALTA ) 20 MG capsule Take 2 capsules (40 mg total) by mouth daily. 07/22/23  Yes Claudene Arthea HERO, DO  gabapentin  (NEURONTIN ) 100 MG capsule Take 2 capsules (200 mg total) by mouth at bedtime. Patient taking differently: Take 100 mg by mouth at bedtime. 04/02/23  Yes Smith, Zachary M, DO  hydrochlorothiazide (MICROZIDE) 12.5 MG capsule Take 12.5 mg by mouth as needed (fluid). 07/20/21  Yes [provider]  levothyroxine  (SYNTHROID , LEVOTHROID) 75 MCG tablet Take 75 mcg by mouth at bedtime.    Yes [provider]  Omega-3 Fatty Acids (FISH OIL) 1000 MG CAPS Take 1,000 mg by mouth daily.   Yes [provider]  omeprazole (PRILOSEC) 20 MG capsule Take 20 mg by mouth 2 (two) times daily.   Yes [provider]  ondansetron  (ZOFRAN -ODT) 4 MG disintegrating tablet Take 1 tablet (4 mg total) by mouth every 8 (eight) hours as needed for nausea or vomiting. 09/28/23  Yes Veta Palma, PA-C  tiZANidine  (ZANAFLEX ) 2 MG tablet Take 1 tablet (2 mg total)  by mouth at bedtime. Patient taking differently: Take 2 mg by mouth as needed for muscle spasms. 04/06/22  Yes Smith, Zachary M, DO  vitamin B-12 (CYANOCOBALAMIN ) 1000 MCG tablet Take 1,000 mcg by mouth daily.   Yes [provider]  Vitamins A & D (VITAMIN A & D) 5000-400 UNITS CAPS Take 1 capsule by mouth daily.   Yes [provider]    Physical Exam: Vitals:   10/03/23 0700 10/03/23 0715 10/03/23 1051 10/03/23 1238  BP:   107/60 116/80  Pulse: 87 91 94 89  Resp:   18 18  Temp:   98.7 F (37.1 C) 98.2 F (36.8 C)  TempSrc:   Oral Oral  SpO2: 96% 96% 95% 97%  Weight:      Height:        Physical Exam Constitutional:      General: She is not in acute distress.  Appearance: Normal appearance.  HENT:     Head: Normocephalic and atraumatic.     Mouth/Throat:     Mouth: Mucous membranes are moist.     Pharynx: Oropharynx is clear.  Eyes:     Extraocular Movements: Extraocular movements intact.     Pupils: Pupils are equal, round, and reactive to light.  Cardiovascular:     Rate and Rhythm: Normal rate and regular rhythm.     Pulses: Normal pulses.     Heart sounds: Normal heart sounds.  Pulmonary:     Effort: Pulmonary effort is normal. No respiratory distress.     Breath sounds: Normal breath sounds.  Abdominal:     General: Bowel sounds are normal. There is no distension.     Palpations: Abdomen is soft.     Tenderness: There is abdominal tenderness.  Musculoskeletal:        General: No swelling or deformity.  Skin:    General: Skin is warm and dry.  Neurological:     General: No focal deficit present.     Mental Status: Mental status is at baseline.    Labs on Admission: I have personally reviewed following labs and imaging studies  CBC: Recent Labs  Lab 09/28/23 1057 10/02/23 1440  WBC 17.4* 15.9*  NEUTROABS  --  13.2*  HGB 15.5* 14.1  HCT 45.8 42.5  MCV 92.7 93.5  PLT 326 421.0*    Basic Metabolic Panel: Recent Labs  Lab  09/28/23 1057 10/02/23 1440 10/02/23 2122  NA 139 127*  --   K 3.4* 3.1*  --   CL 98 90*  --   CO2 25 30  --   GLUCOSE 144* 97  --   BUN 20 15  --   CREATININE 0.88 0.73  --   CALCIUM  9.2 8.7  --   MG  --   --  1.9    GFR: Estimated Creatinine Clearance: 66.4 mL/min (by C-G formula based on SCr of 0.73 mg/dL).  Liver Function Tests: Recent Labs  Lab 09/28/23 1057 10/02/23 1440  AST 254* 29  ALT 328* 48*  ALKPHOS 214* 99  BILITOT 1.7* 0.6  PROT 7.2 6.6  ALBUMIN 4.2 3.4*    Urine analysis:    Component Value Date/Time   COLORURINE AMBER (A) 09/28/2023 1140   APPEARANCEUR CLEAR 09/28/2023 1140   LABSPEC 1.025 09/28/2023 1140   PHURINE 5.5 09/28/2023 1140   GLUCOSEU NEGATIVE 09/28/2023 1140   HGBUR TRACE (A) 09/28/2023 1140   BILIRUBINUR MODERATE (A) 09/28/2023 1140   KETONESUR NEGATIVE 09/28/2023 1140   PROTEINUR 100 (A) 09/28/2023 1140   NITRITE NEGATIVE 09/28/2023 1140   LEUKOCYTESUR NEGATIVE 09/28/2023 1140    Radiological Exams on Admission: CT ABDOMEN PELVIS W CONTRAST Result Date: 10/03/2023 EXAM: CT ABDOMEN AND PELVIS WITH CONTRAST 10/02/2023 11:34:15 PM TECHNIQUE: CT of the abdomen and pelvis was performed with the administration of intravenous contrast. Multiplanar reformatted images are provided for review. Automated exposure control, iterative reconstruction, and/or weight based adjustment of the mA/kV was utilized to reduce the radiation dose to as low as reasonably achievable. COMPARISON: CT abdomen and pelvis 09/28/2023. CLINICAL HISTORY: Pancreatitis suspected. 73 y/o female. Patient was seen here on 7/11 and diagnosed with pancreatitis. Told to f/u with GI. Doesn't have an appointment until 07/17 and pain has increased. Patient has had total hysterectomy. Patient has NOT had a cholecystectomy as prior MRI; reported per patient. FINDINGS: LOWER CHEST: No acute abnormality. LIVER: Hepatic steatosis. GALLBLADDER AND BILE DUCTS: Cholelithiasis  without  evidence of acute cholecystitis. The common bile duct is dilated measuring 10 mm in diameter. SPLEEN: No acute abnormality. PANCREAS: Marked edema and stranding about the pancreas compatible with pancreatitis. No organized fluid collection. No evidence of pancreatic necrosis. No pancreatic ductal dilation. The fluid about the pancreas tracks inferiorly in the anterior perirenal space and central mesentery. The overall extent of fluid and stranding surrounding the pancreas has progressed compared to 09/28/2023. ADRENAL GLANDS: No acute abnormality. KIDNEYS, URETERS AND BLADDER: No stones in the kidneys or ureters. No hydronephrosis. No perinephric or periureteral stranding. GI AND BOWEL: Wall thickening of the gastric antrum and duodenum is likely reactive secondary to pancreatitis. No bowel obstruction. Normal appendix. PERITONEUM AND RETROPERITONEUM: No ascites. No free air. VASCULATURE: Aortic atherosclerotic calcification. Portal vein, SMV and splenic veins are patent. LYMPH NODES: No lymphadenopathy. REPRODUCTIVE ORGANS: Hysterectomy. No adnexal mass. BONES AND SOFT TISSUES: No acute osseous abnormality. No focal soft tissue abnormality. IMPRESSION: 1. Marked edema and stranding about the pancreas compatible with pancreatitis, progressed compared to 09/28/2023. No organized fluid collection, pancreatic necrosis, or pancreatic ductal dilation. 2. Cholelithiasis without evidence of acute cholecystitis. 3. Wall thickening of the gastric antrum and duodenum, likely reactive secondary to pancreatitis. No bowel obstruction. Electronically signed by: Norman Gatlin MD 10/03/2023 12:30 AM EDT RP Workstation: [id number]   DG Abd 2 Views Result Date: 10/02/2023 EXAM: 2 VIEW XRAY OF THE ABDOMEN 10/02/2023 02:42:45 PM COMPARISON: CT abdomen and pelvis dated 09/28/2023. CLINICAL HISTORY: Abdominal pain, nausea, vomiting, diarrhea since 09/26/2023. Patient fell off a tractor on 09/26/2023, symptoms started thereafter.  FINDINGS: BOWEL: Nonobstructive bowel gas pattern. SOFT TISSUES: No abnormal calcifications or opaque urinary calculi. BONES: No acute osseous abnormality. Lower chest: Small bilateral pleural effusions. IMPRESSION: 1. No radiographically apparent acute findings in the abdomen. 2. Small bilateral pleural effusions Electronically signed by: Norman Gatlin MD 10/02/2023 11:59 PM EDT RP Workstation: [id number]   EKG: Not performed in emergency department  Assessment/Plan Principal Problem:   Acute pancreatitis Active Problems:   Gastro-esophageal reflux disease without esophagitis   Hyperlipidemia   Stage 2 chronic kidney disease   Hypertensive chronic kidney disease with stage 1 through stage 4 chronic kidney disease, or unspecified chronic kidney disease   Hypothyroidism   Peripheral neuropathy   Hyponatremia   Hypokalemia   Acute pancreatitis > Patient with ongoing abdominal pain, nausea, vomiting.  First diagnosed with pancreatitis on 7/11. > Initial lipase was greater than 2800 and now has improved to 64 on outpatient labs.  Not repeated in the ED. > Repeat CT scan in the ED showed worsening evidence of pancreatitis by imaging.  Notable for gallstones without cholecystitis as well.  Presumed etiology is likely gallstone pancreatitis. > Received pain medication and fluids in the ED. > Outpatient GI team had notified inpatient GI team that patient was being sent to the hospital. - Monitoring on telemetry unit - Will notify inpatient GI team of patient arrival from med center - Diet: Sips with meds and ice chips - As needed pain medication - Hold off on further IV fluids until sodium response has been assessed - General Surgery consult for evaluation for cholecystectomy   Hyponatremia Hypokalemia > Outpatient labs ordered by GI notable for sodium of 127 which is down from 139 four days prior. > Potassium on outpatient labs noted to be 3.1. > Labs not repeated in the ED.  Patient  received 2 L of IV fluids.  Also received 20 mEq IV potassium. - Change level of  care to monitor on telemetry for now - Repeat labs now to evaluate change in electrolytes - Continue to trend labs - IV fluids pending results of repeat labs - Trend renal function and electrolytes - Check serum osmolality, urine osmolality, urine sodium - Holding hydrochlorothiazide  Hypertension - Resume home atenolol  tomorrow  Hyperlipidemia - Not on medication for this  GERD - Continue home PPI  CKD 2 - Creatinine stable - Treating her function and electrolytes  Hypothyroidism - Continue home Synthroid   Neuropathy - Continue home gabapentin   DVT prophylaxis: Lovenox  Code Status:   Full Family Communication:  Updated at bedside  Disposition Plan:   Patient is from:  Home  Anticipated DC to:  Home  Anticipated DC date:  2 to 4 days  Anticipated DC barriers: None  Consults called:  Gastroenterology, general surgery Admission status:  Inpatient, telemetry  Severity of Illness: The appropriate patient status for this patient is INPATIENT. Inpatient status is judged to be reasonable and necessary in order to provide the required intensity of service to ensure the patient's safety. The patient's presenting symptoms, physical exam findings, and initial radiographic and laboratory data in the context of their chronic comorbidities is felt to place them at high risk for further clinical deterioration. Furthermore, it is not anticipated that the patient will be medically stable for discharge from the hospital within 2 midnights of admission.   * I certify that at the point of admission it is my clinical judgment that the patient will require inpatient hospital care spanning beyond 2 midnights from the point of admission due to high intensity of service, high risk for further deterioration and high frequency of surveillance required.DEWAINE Marsa KATHEE Seena MD [facility]  How to contact the  [facility] Attending or Consulting provider 7A - 7P or covering provider during after hours 7P -7A, for this patient?   Check the care team in [facility] and look for a) attending/consulting [facility] provider listed and b) the [facility] team listed Log into [email or web address] and use [place]'s universal password to access. If you do not have the password, please contact the hospital operator. Locate the [facility] provider you are looking for under [facility] and page to a number that you can be directly reached. If you still have difficulty reaching the provider, please page the [facility] (Director on Call) for the Hospitalists listed on amion for assistance.  10/03/2023, 12:57 PM

## 2023-10-03 NOTE — Progress Notes (Signed)
 Transition of Care [facility]) - Inpatient Brief Assessment   Patient Details  Name: JANI MORONTA MRN: [id number] Date of Birth: 1950-08-01  [facility]) CM/SW Contact:    Rosaline JONELLE Joe, RN Phone Number: 10/03/2023, 2:40 PM   Clinical Narrative: Patient admitted for acute pancreatitis/ abdominal pain and likely gallstone pancreatitis per note.  GI consult pending, [facility] consult pending for cholecystectomy.  Patient has PT evaluation pending at this time.  RNCM with Inpatient Care Management team will continue to follow the patient for transitions of care needs as patient progresses.   Transition of Care Asessment: Insurance and Status: (P) Insurance coverage has been reviewed Patient has primary care physician: (P) Yes Home environment has been reviewed: (P) from home Prior level of function:: (P) self Prior/Current Home Services: (P) No current home services Social Drivers of Health Review: (P) SDOH reviewed needs interventions Readmission risk has been reviewed: (P) Yes Transition of care needs: (P) no transition of care needs at this time

## 2023-10-03 NOTE — Progress Notes (Signed)
 Hospitalist Transfer Note:    Nursing staff, Please call [facility] Admits & Consults System-Wide number on Amion [phone]) as soon as patient's arrival, so appropriate admitting provider can evaluate the pt.    Transferring facility: [facility] Requesting provider: Dr. Geroldine (EDP at [facility]) Reason for transfer: admission for further evaluation and management of acute pancreatitis.    73 year old female with history of acute pancreatitis, who presented to [facility] ED complaining of new onset epigastric pain.   She is reported to have a history of prior acute pancreatitis approximately 1 year ago, and follows with Dr. Wilhelmenia of [facility] gastroenterology.  She had appointment with Dr. Wilhelmenia yesterday, 10/02/23 , for evaluation of his epigastric discomfort, at which time Dr. Wilhelmenia recommended that she present to the ED for further evaluation management thereof, with concern for recurrence of acute pancreatitis, and anticipation of admission for such.  Lipase ordered by Dr. Wilhelmenia demonstrated value of 64. This appears relative to most recent prior value, which was found to be greater than 2800 when checked on 09/28/2023.  Vital signs in the ED were notable for the following: Afebrile; heart rates in the 80s to 90s, systolic blood pressures in the 120s mmHg.  Labs were notable for CBC, which shows white blood cell count 15,900.  Imaging notable for CT abdomen/pelvis with contrast, which was reported to show marked edema and stranding associate with the pancreas, compatible with acute pancreatitis, which was reported to have progressed relative to CT abdomen/pelvis performed on 09/28/2023.  Today CT abdomen/pelvis showed no evidence of abscess, pancreatic necrosis, or pancreatic ductal dilation.  Additionally today CT abdomen/pelvis showed cholelithiasis without any evidence of acute cholecystitis, will demonstrating mild dilation of the common bile duct, reported to  be 10 mm in diameter.  No evidence of overt choledocholithiasis.   Per my discussions with EDP at [facility], it appears that Dr. Wilhelmenia was anticipating that the patient would be admitted.  It is noted that [facility] gastroenterology is on-call at [facility].   Subsequently, I accepted this patient for transfer for inpatient admission to a med-surg bed at [facility] for further work-up and management of the above.      Eva Pore, DO Hospitalist

## 2023-10-03 NOTE — Consult Note (Signed)
 Consult/Admission Note  Abigail Robinson July 16, 1950  [id number].    Requesting MD:  Marsa Scurry, MD  Chief Complaint/Reason for Consult:  Biliary Pancreatitis   HPI:  Abigail Robinson is a 73 year old female with a PMH of pancreatitis, hypothyroidism, HTN, HLD, GERD, endometriosis, colon polyps, and arthritis that presented to the [facility] ED with concerns of epigastric pain. Patient transferred to [facility]. CT completed on 10/02/2023 showed marked edema and stranding about the pancreas compatible with pancreatitis, progressed compared to 09/28/2023. No organized fluid collection, pancreatic necrosis, or pancreatic ductal dilation. Cholelithiasis without acute cholecystitis. No bowel obstruction.   Patient recently evaluated at [facility] ED in [facility] on 09/28/2023 after having nausea, vomiting, and diffuse abdominal pain after falling off of her tractor two days prior. CT 7/9//2025 was without acute abnormality.  Reported 7 episodes of emesis at that time. CT head was completed on 09/28/2023 without acute abnormality. CT A/P showed acute interstitial edematous pancreatitis, likely gallstone pancreatitis. Mildly common bile duct was dilated and the ampulla appeared inflamed. There was no obstructive choledocholithiasis. A likely passed calculus was layering in the third portion of the duodenum. Endoscopy was recommended in 6-12 weeks. Lipase was >2800. Patient was discharged home and was advised to follow-up with GI, Dr. Wilhelmenia.   Today, patient reports that she has been experiencing diffuse abdominal pain for about 6 days (since 09/28/2023) that has improved since 7/11. Patient reports that her last bowel movement was 6 days ago. She also reports vomiting that is mostly bile. Denies blood in emesis. She is experiencing nausea.   She denies history or current use of tobacco products, recreational drug use, or consumption of alcohol . Patient has had oophorectomy, laparoscopic vaginal  hysterectomy with salpingo oophorectomy, colonoscopy, and anterior and posterior repair with sacrospinous fixation. Denies any other surgeries. Denies anticoagulation medications. Patient allergies include demerol and gadolinium.  ROS: Negative other than HPI.   Family History  Problem Relation Age of Onset   Liver cancer Mother    Kidney cancer Mother    Lung cancer Mother    Heart disease Father    Hypertension Father    Cirrhosis Father    Macular degeneration Father    Diabetes Brother    Heart attack Brother    Breast cancer Maternal Grandmother    Stroke Maternal Grandmother    Colon cancer Neg Hx    Esophageal cancer Neg Hx    Stomach cancer Neg Hx     Past Medical History:  Diagnosis Date   Allergy    Arthritis    back, hands   Basal cell carcinoma    face   Colon polyps    Complication of anesthesia    prolonged sedation, slow to wake up   Diplopia    Endometriosis    GERD (gastroesophageal reflux disease)    Headache    Migraines   Hyperlipidemia    Hypertension    Hypothyroidism    Skin cancer    hx of   Status post dilation of esophageal narrowing    Thalamic cyst    Thyroid  disease    Vertigo     Past Surgical History:  Procedure Laterality Date   ANTERIOR AND POSTERIOR REPAIR WITH SACROSPINOUS FIXATION N/A 07/08/2015   Procedure: ANTERIOR AND POSTERIOR REPAIR ;  Surgeon: Alm Cook, MD;  Location: [facility];  Service: Gynecology;  Laterality: N/A;   COLONOSCOPY     DIAGNOSTIC LAPAROSCOPY     LAPAROSCOPIC  VAGINAL HYSTERECTOMY WITH SALPINGO OOPHORECTOMY Bilateral 07/08/2015   Procedure: LAPAROSCOPIC ASSISTED VAGINAL HYSTERECTOMY WITH BILATERAL SALPINGO OOPHORECTOMY;  Surgeon: Alm Cook, MD;  Location: [facility];  Service: Gynecology;  Laterality: Bilateral;   OOPHORECTOMY  age 47   for endometrosis    Social History:  reports that she has never smoked. She has never used smokeless tobacco. She reports that she does not currently use alcohol . She  reports that she does not use drugs.  Allergies:  Allergies  Allergen Reactions   Demerol [Meperidine] Nausea Only and Other (See Comments)    Reaction=headache   Gadolinium Dermatitis    Medications Prior to Admission  Medication Sig Dispense Refill   acetaminophen  (TYLENOL ) 500 MG tablet Take 1,000 mg by mouth 2 (two) times daily as needed for moderate pain or headache.      ascorbic acid (VITAMIN C) 1000 MG tablet Take 1,000 mg by mouth daily.     atenolol  (TENORMIN ) 25 MG tablet Take 25 mg by mouth daily.     Calcium  Carb-Cholecalciferol 500-600 MG-UNIT TABS Take 1 tablet by mouth daily.     DULoxetine  (CYMBALTA ) 20 MG capsule Take 2 capsules (40 mg total) by mouth daily. 180 capsule 0   gabapentin  (NEURONTIN ) 100 MG capsule Take 2 capsules (200 mg total) by mouth at bedtime. (Patient taking differently: Take 100 mg by mouth at bedtime.) 120 capsule 0   hydrochlorothiazide (MICROZIDE) 12.5 MG capsule Take 12.5 mg by mouth as needed (fluid).     levothyroxine  (SYNTHROID , LEVOTHROID) 75 MCG tablet Take 75 mcg by mouth at bedtime.      Omega-3 Fatty Acids (FISH OIL) 1000 MG CAPS Take 1,000 mg by mouth daily.     omeprazole (PRILOSEC) 20 MG capsule Take 20 mg by mouth 2 (two) times daily.     ondansetron  (ZOFRAN -ODT) 4 MG disintegrating tablet Take 1 tablet (4 mg total) by mouth every 8 (eight) hours as needed for nausea or vomiting. 20 tablet 0   tiZANidine  (ZANAFLEX ) 2 MG tablet Take 1 tablet (2 mg total) by mouth at bedtime. (Patient taking differently: Take 2 mg by mouth as needed for muscle spasms.) 30 tablet 0   vitamin B-12 (CYANOCOBALAMIN ) 1000 MCG tablet Take 1,000 mcg by mouth daily.     Vitamins A & D (VITAMIN A & D) 5000-400 UNITS CAPS Take 1 capsule by mouth daily.      Blood pressure 116/80, pulse 89, temperature 98.2 F (36.8 C), temperature source Oral, resp. rate 18, height 5' 7 (1.702 m), weight 78 kg, SpO2 97%. Physical Exam:  General: Pleasant, WD, female who is  laying in bed in NAD. HEENT: Sclera are non injected and anicteric. Heart: Regular, rate, and rhythm. Lungs: Respiratory effort nonlabored Abd: Soft with mild distention. Generalized abdominal tenderness with palpation. No peritonitis  Psych: A&Ox3 with an appropriate affect.   Results for orders placed or performed during the hospital encounter of 10/02/23 (from the past 48 hours)  Magnesium     Status: None   Collection Time: 10/02/23  9:22 PM  Result Value Ref Range   Magnesium 1.9 1.7 - 2.4 mg/dL    Comment: Performed at [facility], [address] [place], [place] [address]  Comprehensive metabolic panel     Status: Abnormal   Collection Time: 10/03/23  1:22 PM  Result Value Ref Range   Sodium 131 (L) 135 - 145 mmol/L   Potassium 3.4 (L) 3.5 - 5.1 mmol/L   Chloride 96 (L) 98 -  111 mmol/L   CO2 24 22 - 32 mmol/L   Glucose, Bld 92 70 - 99 mg/dL    Comment: Glucose reference range applies only to samples taken after fasting for at least 8 hours.   BUN 9 8 - 23 mg/dL   Creatinine, Ser 9.36 0.44 - 1.00 mg/dL   Calcium  8.4 (L) 8.9 - 10.3 mg/dL   Total Protein 5.9 (L) 6.5 - 8.1 g/dL   Albumin 2.3 (L) 3.5 - 5.0 g/dL   AST 31 15 - 41 U/L   ALT 38 0 - 44 U/L   Alkaline Phosphatase 97 38 - 126 U/L   Total Bilirubin 0.6 0.0 - 1.2 mg/dL   GFR, Estimated >39 >39 mL/min    Comment: (NOTE) Calculated using the CKD-EPI Creatinine Equation (2021)    Anion gap 11 5 - 15    Comment: Performed at [facility], [address], [place], [place] [address]  CBC     Status: Abnormal   Collection Time: 10/03/23  1:22 PM  Result Value Ref Range   WBC 21.3 (H) 4.0 - 10.5 K/uL   RBC 4.38 3.87 - 5.11 MIL/uL   Hemoglobin 13.8 12.0 - 15.0 g/dL   HCT 60.3 63.9 - 53.9 %   MCV 90.4 80.0 - 100.0 fL   MCH 31.5 26.0 - 34.0 pg   MCHC 34.8 30.0 - 36.0 g/dL   RDW 86.5 88.4 - 84.4 %   Platelets 415 (H) 150 - 400 K/uL   nRBC 0.0 0.0 - 0.2 %    Comment: Performed at [facility]  [facility], [address], [place], [place] [address]  Protime-INR     Status: Abnormal   Collection Time: 10/03/23  1:22 PM  Result Value Ref Range   Prothrombin Time 17.7 (H) 11.4 - 15.2 seconds   INR 1.4 (H) 0.8 - 1.2    Comment: (NOTE) INR goal varies based on device and disease states. Performed at [facility], [address], [place], [place] [address]   Osmolality     Status: Abnormal   Collection Time: 10/03/23  1:22 PM  Result Value Ref Range   Osmolality 271 (L) 275 - 295 mOsm/kg    Comment: Performed at [facility], [address], [place], [place] [address]   CT ABDOMEN PELVIS W CONTRAST Result Date: 10/03/2023 EXAM: CT ABDOMEN AND PELVIS WITH CONTRAST 10/02/2023 11:34:15 PM TECHNIQUE: CT of the abdomen and pelvis was performed with the administration of intravenous contrast. Multiplanar reformatted images are provided for review. Automated exposure control, iterative reconstruction, and/or weight based adjustment of the mA/kV was utilized to reduce the radiation dose to as low as reasonably achievable. COMPARISON: CT abdomen and pelvis 09/28/2023. CLINICAL HISTORY: Pancreatitis suspected. 73 y/o female. Patient was seen here on 7/11 and diagnosed with pancreatitis. Told to f/u with GI. Doesn't have an appointment until 07/17 and pain has increased. Patient has had total hysterectomy. Patient has NOT had a cholecystectomy as prior MRI; reported per patient. FINDINGS: LOWER CHEST: No acute abnormality. LIVER: Hepatic steatosis. GALLBLADDER AND BILE DUCTS: Cholelithiasis without evidence of acute cholecystitis. The common bile duct is dilated measuring 10 mm in diameter. SPLEEN: No acute abnormality. PANCREAS: Marked edema and stranding about the pancreas compatible with pancreatitis. No organized fluid collection. No evidence of pancreatic necrosis. No pancreatic ductal dilation. The fluid about the pancreas tracks inferiorly in the anterior perirenal space and central mesentery. The  overall extent of fluid and stranding  surrounding the pancreas has progressed compared to 09/28/2023. ADRENAL GLANDS: No acute abnormality. KIDNEYS, URETERS AND BLADDER: No stones in the kidneys or ureters. No hydronephrosis. No perinephric or periureteral stranding. GI AND BOWEL: Wall thickening of the gastric antrum and duodenum is likely reactive secondary to pancreatitis. No bowel obstruction. Normal appendix. PERITONEUM AND RETROPERITONEUM: No ascites. No free air. VASCULATURE: Aortic atherosclerotic calcification. Portal vein, SMV and splenic veins are patent. LYMPH NODES: No lymphadenopathy. REPRODUCTIVE ORGANS: Hysterectomy. No adnexal mass. BONES AND SOFT TISSUES: No acute osseous abnormality. No focal soft tissue abnormality. IMPRESSION: 1. Marked edema and stranding about the pancreas compatible with pancreatitis, progressed compared to 09/28/2023. No organized fluid collection, pancreatic necrosis, or pancreatic ductal dilation. 2. Cholelithiasis without evidence of acute cholecystitis. 3. Wall thickening of the gastric antrum and duodenum, likely reactive secondary to pancreatitis. No bowel obstruction. Electronically signed by: Norman Gatlin MD 10/03/2023 12:30 AM EDT RP Workstation: [id number]   DG Abd 2 Views Result Date: 10/02/2023 EXAM: 2 VIEW XRAY OF THE ABDOMEN 10/02/2023 02:42:45 PM COMPARISON: CT abdomen and pelvis dated 09/28/2023. CLINICAL HISTORY: Abdominal pain, nausea, vomiting, diarrhea since 09/26/2023. Patient fell off a tractor on 09/26/2023, symptoms started thereafter. FINDINGS: BOWEL: Nonobstructive bowel gas pattern. SOFT TISSUES: No abnormal calcifications or opaque urinary calculi. BONES: No acute osseous abnormality. Lower chest: Small bilateral pleural effusions. IMPRESSION: 1. No radiographically apparent acute findings in the abdomen. 2. Small bilateral pleural effusions Electronically signed by: Norman Gatlin MD 10/02/2023 11:59 PM EDT RP Workstation: [id number]       Assessment/Plan Acute pancreatitis Cholelithiasis - CT completed on 10/02/2023 showed marked edema and stranding about the pancreas compatible with pancreatitis, progressed compared to 09/28/2023. No organized fluid collection, pancreatic necrosis, or pancreatic ductal dilation. Cholelithiasis without acute cholecystitis. No bowel obstruction.  - WBC 21.3. Vitals stable. Could be more of an inflammatory response than infection. - Lipase on 7/15 64.0. On 7/11 >2800. - GI following  - Cholecystectomy is indicated but given severity of pancreatitis, timing may need to be in a delayed fashion. General surgery will follow.   FEN: CLD, Sodium chloride  VTE: Enoxaparin  ID: None  - per [facility] - Hyponatremia Hypokalemia HTN HLD GERD CKD2 Hypothyroidism Neuropathy   I reviewed hospitalist notes, last 24 h vitals and pain scores, last 48 h intake and output, last 24 h labs and trends, last 24 h imaging results, and prior ED/outpatient notes.  This care required high  level of medical decision making.   Marjorie Carlyon Favre, [facility] 10/03/2023, 4:11 PM Please see Amion for pager number during day hours 7:00am-4:30pm

## 2023-10-03 NOTE — Consult Note (Signed)
 Consultation  Referring Provider: TRH/ melvin Primary Care Physician:  Stephane Leita DEL, MD Primary Gastroenterologist:  Dr.Mansouraty  Reason for Consultation: Acute pancreatitis  HPI: Abigail Robinson is a 73 y.o. female, known to Dr. Arville Roddy from previous endoscopic evaluation with history of GERD with esophageal stricture,, hypertension, hyperlipidemia and colon polyps. Patient's current illness started after she had fallen off of a small tractor at home, and hit her head and left shoulder on the side of a barn. She did not have any trauma to her abdomen at that time. She went to the emergency room after the fall and had to have sutures in the back of her head. She reports that at that time she was not having any abdominal discomfort.  The following day she was able to eat breakfast without difficulty, then at lunchtime says she started to notice some discomfort in her abdomen, decided to eat sweet potatoes, and then after that abdominal discomfort increased, and was associated with nausea and vomiting.  She has been feeling poorly ever since with persistent abdominal pain, and inability to keep down much of anything p.o. She was seen in the emergency room again on 09/28/2023 Workup at that time showed WBC of 17.4 Lipase 2800 T. bili 1.7/alk phos 214/AST 254/ALT 328 CT of the abdomen and pelvis was done which showed a right hepatic lobe cyst, there were couple of gallstones noted in the neck of the gallbladder, no gallbladder wall thickening, there is mild diffuse extrahepatic biliary ductal dilation with the duct measuring up to 9 mm, low insertion of the cystic duct on the distal common bile duct and apparent enhancement at the ampulla, also noted a small hypodensity in the pancreatic neck measuring 5 mm, diffuse acute peripancreatic fluid surrounding the entire pancreatic parenchyma and extending into the anterior pararenal spaces bilaterally and paracolic gutters is felt to be a  punctate calculus layering in the third portion of the duodenum likely passed stone She was managed with pain medication antiemetics etc. and was allowed discharge to home with instructions to follow-up with GI. She had called our office yesterday, still doing poorly, her ER visit had been reviewed and she was advised to come back to the emergency room. Labs yesterday with CRP 177/sed rate 86 INR 1.5 Lipase 64 T. bili 0.6/alk phos 99/AST 29/ALT 48 WBC 15.9/hemoglobin 14.1/hematocrit 42.5  Today WBC up to 21.3 Potassium 3.4/creatinine 0.63 LFTs completely normal  Repeat CT with contrast-hepatic steatosis, cholelithiasis without cholecystitis common bile duct 10 mm there is marked edema and stranding around the pancreas compatible with pancreatitis no organized fluid collection no necrosis,.  Overall the extent of the fluid and stranding surround the pancreas has progressed since 09/28/2023 there is some wall thickening of the gastric antrum and duodenum likely reactive.  Patient says she has kept some water and Gatorade down today, she is unaware of any fever or chills at home.  A oral pain medication has been helpful but she has to take it every 4 hours and as soon as it starts wearing off her pain becomes severe again.   Past Medical History:  Diagnosis Date   Allergy    Arthritis    back, hands   Basal cell carcinoma    face   Colon polyps    Complication of anesthesia    prolonged sedation, slow to wake up   Diplopia    Endometriosis    GERD (gastroesophageal reflux disease)    Headache  Migraines   Hyperlipidemia    Hypertension    Hypothyroidism    Skin cancer    hx of   Status post dilation of esophageal narrowing    Thalamic cyst    Thyroid  disease    Vertigo     Past Surgical History:  Procedure Laterality Date   ANTERIOR AND POSTERIOR REPAIR WITH SACROSPINOUS FIXATION N/A 07/08/2015   Procedure: ANTERIOR AND POSTERIOR REPAIR ;  Surgeon: Alm Cook, MD;   Location: [facility];  Service: Gynecology;  Laterality: N/A;   COLONOSCOPY     DIAGNOSTIC LAPAROSCOPY     LAPAROSCOPIC VAGINAL HYSTERECTOMY WITH SALPINGO OOPHORECTOMY Bilateral 07/08/2015   Procedure: LAPAROSCOPIC ASSISTED VAGINAL HYSTERECTOMY WITH BILATERAL SALPINGO OOPHORECTOMY;  Surgeon: Alm Cook, MD;  Location: [facility];  Service: Gynecology;  Laterality: Bilateral;   OOPHORECTOMY  age 52   for endometrosis    Prior to Admission medications   Medication Sig Start Date End Date Taking? Authorizing Provider  acetaminophen  (TYLENOL ) 500 MG tablet Take 1,000 mg by mouth 2 (two) times daily as needed for moderate pain or headache.    Yes [provider]  ascorbic acid (VITAMIN C) 1000 MG tablet Take 1,000 mg by mouth daily.   Yes [provider]  atenolol  (TENORMIN ) 25 MG tablet Take 25 mg by mouth daily. 08/04/23  Yes [provider]  Calcium  Carb-Cholecalciferol 500-600 MG-UNIT TABS Take 1 tablet by mouth daily.   Yes [provider]  DULoxetine  (CYMBALTA ) 20 MG capsule Take 2 capsules (40 mg total) by mouth daily. 07/22/23  Yes Claudene Arthea HERO, DO  gabapentin  (NEURONTIN ) 100 MG capsule Take 2 capsules (200 mg total) by mouth at bedtime. Patient taking differently: Take 100 mg by mouth at bedtime. 04/02/23  Yes Smith, Zachary M, DO  hydrochlorothiazide (MICROZIDE) 12.5 MG capsule Take 12.5 mg by mouth as needed (fluid). 07/20/21  Yes [provider]  levothyroxine  (SYNTHROID , LEVOTHROID) 75 MCG tablet Take 75 mcg by mouth at bedtime.    Yes [provider]  Omega-3 Fatty Acids (FISH OIL) 1000 MG CAPS Take 1,000 mg by mouth daily.   Yes [provider]  omeprazole (PRILOSEC) 20 MG capsule Take 20 mg by mouth 2 (two) times daily.   Yes [provider]  ondansetron  (ZOFRAN -ODT) 4 MG disintegrating tablet Take 1 tablet (4 mg total) by mouth every 8 (eight) hours as needed for nausea or vomiting. 09/28/23  Yes Veta Palma, PA-C   tiZANidine  (ZANAFLEX ) 2 MG tablet Take 1 tablet (2 mg total) by mouth at bedtime. Patient taking differently: Take 2 mg by mouth as needed for muscle spasms. 04/06/22  Yes Smith, Zachary M, DO  vitamin B-12 (CYANOCOBALAMIN ) 1000 MCG tablet Take 1,000 mcg by mouth daily.   Yes [provider]  Vitamins A & D (VITAMIN A & D) 5000-400 UNITS CAPS Take 1 capsule by mouth daily.   Yes [provider]    Current Facility-Administered Medications  Medication Dose Route Frequency Provider Last Rate Last Admin   acetaminophen  (TYLENOL ) tablet 650 mg  650 mg Oral Q6H PRN Seena Marsa NOVAK, MD   650 mg at 10/03/23 1710   Or   acetaminophen  (TYLENOL ) suppository 650 mg  650 mg Rectal Q6H PRN Seena Marsa NOVAK, MD       [START ON 10/04/2023] atenolol  (TENORMIN ) tablet 25 mg  25 mg Oral Daily Melvin, Alexander B, MD       [START ON 10/04/2023] DULoxetine  (CYMBALTA ) DR capsule 40 mg  40 mg  Oral Daily Melvin, Alexander B, MD       enoxaparin  (LOVENOX ) injection 40 mg  40 mg Subcutaneous Q24H Melvin, Alexander B, MD       gabapentin  (NEURONTIN ) capsule 100 mg  100 mg Oral QHS Melvin, Alexander B, MD       HYDROmorphone  (DILAUDID ) injection 0.5 mg  0.5 mg Intravenous Q4H PRN Seena Marsa NOVAK, MD       lactated ringers  infusion   Intravenous Continuous Shallon Yaklin S, PA-C       levothyroxine  (SYNTHROID ) tablet 75 mcg  75 mcg Oral QHS Melvin, Alexander B, MD       pantoprazole  (PROTONIX ) EC tablet 40 mg  40 mg Oral Daily Melvin, Alexander B, MD   40 mg at 10/03/23 1710   polyethylene glycol (MIRALAX  / GLYCOLAX ) packet 17 g  17 g Oral Daily PRN Seena Marsa NOVAK, MD       sodium chloride  flush (NS) 0.9 % injection 3 mL  3 mL Intravenous Q12H Seena Marsa NOVAK, MD   3 mL at 10/03/23 1304    Allergies as of 10/02/2023 - Review Complete 10/02/2023  Allergen Reaction Noted   Demerol [meperidine] Nausea Only and Other (See Comments) 01/22/2012    Family History  Problem Relation Age  of Onset   Liver cancer Mother    Kidney cancer Mother    Lung cancer Mother    Heart disease Father    Hypertension Father    Cirrhosis Father    Macular degeneration Father    Diabetes Brother    Heart attack Brother    Breast cancer Maternal Grandmother    Stroke Maternal Grandmother    Colon cancer Neg Hx    Esophageal cancer Neg Hx    Stomach cancer Neg Hx     Social History   Socioeconomic History   Marital status: Widowed    Spouse name: Not on file   Number of children: 0   Years of education: Not on file   Highest education level: Bachelor's degree (e.g., BA, AB, BS)  Occupational History    Comment: retired  Tobacco Use   Smoking status: Never   Smokeless tobacco: Never  Vaping Use   Vaping status: Never Used  Substance and Sexual Activity   Alcohol  use: Not Currently   Drug use: No   Sexual activity: Not Currently  Other Topics Concern   Not on file  Social History Narrative   Lives alone   Social Drivers of Health   Financial Resource Strain: Not on file  Food Insecurity: No Food Insecurity (10/03/2023)   Hunger Vital Sign    Worried About Running Out of Food in the Last Year: Never true    Ran Out of Food in the Last Year: Never true  Transportation Needs: No Transportation Needs (10/03/2023)   PRAPARE - Administrator, Civil Service (Medical): No    Lack of Transportation (Non-Medical): No  Physical Activity: Not on file  Stress: Not on file  Social Connections: Unknown (10/03/2023)   Social Connection and Isolation Panel    Frequency of Communication with Friends and Family: Three times a week    Frequency of Social Gatherings with Friends and Family: Three times a week    Attends Religious Services: More than 4 times per year    Active Member of Clubs or Organizations: Yes    Attends Banker Meetings: More than 4 times per year    Marital Status: Patient declined  Intimate Partner Violence: Not At Risk (10/03/2023)    Humiliation, Afraid, Rape, and Kick questionnaire    Fear of Current or Ex-Partner: No    Emotionally Abused: No    Physically Abused: No    Sexually Abused: No    Review of Systems: Pertinent positive and negative review of systems were noted in the above HPI section.  All other review of systems was otherwise negative.   Physical Exam: Vital signs in last 24 hours: Temp:  [98.1 F (36.7 C)-98.7 F (37.1 C)] 98.2 F (36.8 C) (07/16 1238) Pulse Rate:  [83-94] 89 (07/16 1238) Resp:  [15-18] 18 (07/16 1238) BP: (100-127)/(53-80) 116/80 (07/16 1238) SpO2:  [90 %-99 %] 97 % (07/16 1238) Weight:  [78 kg] 78 kg (07/15 1800) Last BM Date : 09/30/23 General:   Alert,  Well-developed, well-nourished, elderly white female pleasant and cooperative in NAD, husband at bedside Head:  Normocephalic and atraumatic. Eyes:  Sclera clear, no icterus.   Conjunctiva pink. Ears:  Normal auditory acuity. Nose:  No deformity, discharge,  or lesions. Mouth:  No deformity or lesions.   Neck:  Supple; no masses or thyromegaly. Lungs:  Clear throughout to auscultation.   No wheezes, crackles, or rhonchi.  Heart:  Regular rate and rhythm; no murmurs, clicks, rubs,  or gallops. Abdomen:  Soft, she is tender across the hypogastrium/upper abdomen no guarding or rebound, BS active,nonpalp mass or hsm.   Rectal:  Deferred  Msk:  Symmetrical without gross deformities. . Pulses:  Normal pulses noted. Extremities:  Without clubbing or edema.  Ecchymoses of left shoulder Neurologic:  Alert and  oriented x4;  grossly normal neurologically. Skin:  Intact without significant lesions or rashes.. Psych:  Alert and cooperative. Normal mood and affect.  Intake/Output from previous day: 07/15 0701 - 07/16 0700 In: 1000.3 [IV Piggyback:1000.3] Out: -  Intake/Output this shift: No intake/output data recorded.  Lab Results: Recent Labs    10/02/23 1440 10/03/23 1322  WBC 15.9* 21.3*  HGB 14.1 13.8  HCT 42.5  39.6  PLT 421.0* 415*   BMET Recent Labs    10/02/23 1440 10/03/23 1322  NA 127* 131*  K 3.1* 3.4*  CL 90* 96*  CO2 30 24  GLUCOSE 97 92  BUN 15 9  CREATININE 0.73 0.63  CALCIUM  8.7 8.4*   LFT Recent Labs    10/03/23 1322  PROT 5.9*  ALBUMIN 2.3*  AST 31  ALT 38  ALKPHOS 97  BILITOT 0.6   PT/INR Recent Labs    10/02/23 1440 10/03/23 1322  LABPROT 15.4* 17.7*  INR 1.5* 1.4*   Hepatitis Panel No results for input(s): HEPBSAG, HCVAB, HEPAIGM, HEPBIGM in the last 72 hours.    IMPRESSION:  #63 73 year old white female with acute pancreatitis onset 09/27/2023 Initially evaluated in the ER on 09/28/2023 with diagnosis of acute pancreatitis, allowed discharge at that time and has done poorly since with poor oral intake and persistent somewhat progressive abdominal pain and nausea  Initial imaging with CT showed some small stones in the gallbladder neck and dilated CBD with some hyperenhancement at the ampulla and what was felt to be a punctate stone in the third portion of the duodenum distant with a passed stone.  He did have mild LFT elevation at that time  Repeat labs yesterday showed normalization of her LFTs however she does have a leukocytosis which has been progressive  CT last p.m. shows persistent ductal dilation with CBD of 10 mm and increase in peripancreatic  fluid but no organized collection, no evidence of pancreatic necrosis and no cholecystitis  #2 fall from tractor 09/26/2023 striking head and left shoulder-previous imaging negative for fractures etc.  #3 GERD with previous esophageal dilation #4 hypertension #5 hyperlipidemia  PLAN: Clear liquid diet Given somewhat progressive pancreatitis, and leukocytosis will keep her on lactated Ringer 's at 125 cc/h Antiemetics as needed, pain control Repeat labs in a.m.  I think she will need MRI/MRCP to assure no retained stone and also to further evaluate the ampulla-this can be ordered tomorrow She  will need to see surgery at some point for laparoscopic cholecystectomy but at this point would allow her pancreatitis to cool down prior to proceeding GI will follow with you  Maxamilian Amadon PA-C 10/03/2023, 5:25 PM

## 2023-10-04 ENCOUNTER — Inpatient Hospital Stay (HOSPITAL_COMMUNITY)

## 2023-10-04 DIAGNOSIS — K851 Biliary acute pancreatitis without necrosis or infection: Secondary | ICD-10-CM | POA: Diagnosis not present

## 2023-10-04 LAB — CBC
HCT: 37.3 % (ref 36.0–46.0)
Hemoglobin: 13.1 g/dL (ref 12.0–15.0)
MCH: 31.4 pg (ref 26.0–34.0)
MCHC: 35.1 g/dL (ref 30.0–36.0)
MCV: 89.4 fL (ref 80.0–100.0)
Platelets: 408 K/uL — ABNORMAL HIGH (ref 150–400)
RBC: 4.17 MIL/uL (ref 3.87–5.11)
RDW: 13.6 % (ref 11.5–15.5)
WBC: 22.8 K/uL — ABNORMAL HIGH (ref 4.0–10.5)
nRBC: 0 % (ref 0.0–0.2)

## 2023-10-04 LAB — COMPREHENSIVE METABOLIC PANEL WITH GFR
ALT: 36 U/L (ref 0–44)
AST: 35 U/L (ref 15–41)
Albumin: 2.1 g/dL — ABNORMAL LOW (ref 3.5–5.0)
Alkaline Phosphatase: 95 U/L (ref 38–126)
Anion gap: 10 (ref 5–15)
BUN: 7 mg/dL — ABNORMAL LOW (ref 8–23)
CO2: 24 mmol/L (ref 22–32)
Calcium: 8.3 mg/dL — ABNORMAL LOW (ref 8.9–10.3)
Chloride: 96 mmol/L — ABNORMAL LOW (ref 98–111)
Creatinine, Ser: 0.63 mg/dL (ref 0.44–1.00)
GFR, Estimated: >60 mL/min (ref >60)
Glucose, Bld: 102 mg/dL — ABNORMAL HIGH (ref 70–99)
Potassium: 3.4 mmol/L — ABNORMAL LOW (ref 3.5–5.1)
Sodium: 130 mmol/L — ABNORMAL LOW (ref 135–145)
Total Bilirubin: 0.7 mg/dL (ref 0.0–1.2)
Total Protein: 5.3 g/dL — ABNORMAL LOW (ref 6.5–8.1)

## 2023-10-04 MED ORDER — GADOBUTROL 1 MMOL/ML IV SOLN
7.5000 mL | Freq: Once | INTRAVENOUS | Status: AC | PRN
  Administered 2023-10-04: 7.5 mL via INTRAVENOUS

## 2023-10-04 MED ORDER — ONDANSETRON HCL 4 MG/2ML IJ SOLN
4.0000 mg | Freq: Four times a day (QID) | INTRAMUSCULAR | Status: DC | PRN
  Administered 2023-10-04: 4 mg via INTRAVENOUS
  Filled 2023-10-04: qty 2

## 2023-10-04 MED ORDER — CALCIUM CARBONATE ANTACID 500 MG PO CHEW
1.0000 | CHEWABLE_TABLET | Freq: Two times a day (BID) | ORAL | Status: DC
  Administered 2023-10-04 – 2023-10-06 (×5): 200 mg via ORAL
  Filled 2023-10-04 (×5): qty 1

## 2023-10-04 NOTE — Progress Notes (Deleted)
 [facility] [address] [place] [address] Phone: [phone] Subjective:    I'm seeing this patient by the request  of:  Abigail Robinson DEL, MD  CC:   YEP:[id number]  06/27/2023 I believe the patient is having an exacerbation of her underlying condition.  And seems to have a even more of a whiplash component.  Patient will start formal physical therapy.  Is simply doing a 2-week road trip coming up.  I think that this could be causing some exacerbation as well.  Discussed prednisone  and given for the ride if necessary.  Discussed icing regimen of home exercises, discussed which activities to do and which ones to avoid.  Increase activity slowly otherwise.  Follow-up again in 6 to 8 weeks otherwise.  Laboratory workup noted     Does have rotator cuff arthropathy noted.  We will monitor closely.  Could do a repeat injection if needed.  We discussed icing regimen.  Discussed formal physical therapy.  Prednisone  given for her trip where patient is going to be doing a car ride for a longer duration.  Follow-up with me again in 6 to 8 weeks otherwise.  Differential includes degenerative disc disease and I do think patient has more of a whiplash syndrome as well.     Update 10/10/2023  Update 10/10/2023 Abigail Robinson is a 73 y.o. female coming in with complaint of L shoulder pain. Patient states       Past Medical History:  Diagnosis Date   Allergy    Arthritis    back, hands   Basal cell carcinoma    face   Colon polyps    Complication of anesthesia    prolonged sedation, slow to wake up   Diplopia    Endometriosis    GERD (gastroesophageal reflux disease)    Headache    Migraines   Hyperlipidemia    Hypertension    Hypothyroidism    Skin cancer    hx of   Status post dilation of esophageal narrowing    Thalamic cyst    Thyroid  disease    Vertigo    Past Surgical History:  Procedure Laterality Date   ANTERIOR AND POSTERIOR REPAIR WITH  SACROSPINOUS FIXATION N/A 07/08/2015   Procedure: ANTERIOR AND POSTERIOR REPAIR ;  Surgeon: Alm Cook, MD;  Location: [facility];  Service: Gynecology;  Laterality: N/A;   COLONOSCOPY     DIAGNOSTIC LAPAROSCOPY     LAPAROSCOPIC VAGINAL HYSTERECTOMY WITH SALPINGO OOPHORECTOMY Bilateral 07/08/2015   Procedure: LAPAROSCOPIC ASSISTED VAGINAL HYSTERECTOMY WITH BILATERAL SALPINGO OOPHORECTOMY;  Surgeon: Alm Cook, MD;  Location: [facility];  Service: Gynecology;  Laterality: Bilateral;   OOPHORECTOMY  age 42   for endometrosis   Social History   Socioeconomic History   Marital status: Widowed    Spouse name: Not on file   Number of children: 0   Years of education: Not on file   Highest education level: Bachelor's degree (e.g., BA, AB, BS)  Occupational History    Comment: retired  Tobacco Use   Smoking status: Never   Smokeless tobacco: Never  Vaping Use   Vaping status: Never Used  Substance and Sexual Activity   Alcohol  use: Not Currently   Drug use: No   Sexual activity: Not Currently  Other Topics Concern   Not on file  Social History Narrative   Lives alone   Social Drivers of Health   Financial Resource Strain: Not on file  Food Insecurity: No  Food Insecurity (10/03/2023)   Hunger Vital Sign    Worried About Running Out of Food in the Last Year: Never true    Ran Out of Food in the Last Year: Never true  Transportation Needs: No Transportation Needs (10/03/2023)   PRAPARE - Administrator, Civil Service (Medical): No    Lack of Transportation (Non-Medical): No  Physical Activity: Not on file  Stress: Not on file  Social Connections: Unknown (10/03/2023)   Social Connection and Isolation Panel    Frequency of Communication with Friends and Family: Three times a week    Frequency of Social Gatherings with Friends and Family: Three times a week    Attends Religious Services: More than 4 times per year    Active Member of Clubs or Organizations: Yes    Attends Probation officer: More than 4 times per year    Marital Status: Patient declined   Allergies  Allergen Reactions   Demerol [Meperidine] Nausea Only and Other (See Comments)    Reaction=headache   Gadolinium Dermatitis   Family History  Problem Relation Age of Onset   Liver cancer Mother    Kidney cancer Mother    Lung cancer Mother    Heart disease Father    Hypertension Father    Cirrhosis Father    Macular degeneration Father    Diabetes Brother    Heart attack Brother    Breast cancer Maternal Grandmother    Stroke Maternal Grandmother    Colon cancer Neg Hx    Esophageal cancer Neg Hx    Stomach cancer Neg Hx       Facility-Administered Medications Ordered in Other Visits (Endocrine & Metabolic):    levothyroxine  (SYNTHROID ) tablet 75 mcg    Facility-Administered Medications Ordered in Other Visits (Cardiovascular):    atenolol  (TENORMIN ) tablet 25 mg      Facility-Administered Medications Ordered in Other Visits (Analgesics):    acetaminophen  (TYLENOL ) tablet 650 mg **OR** acetaminophen  (TYLENOL ) suppository 650 mg   HYDROmorphone  (DILAUDID ) injection 0.5 mg    Facility-Administered Medications Ordered in Other Visits (Hematological):    enoxaparin  (LOVENOX ) injection 40 mg    Facility-Administered Medications Ordered in Other Visits (Other):    DULoxetine  (CYMBALTA ) DR capsule 40 mg   gabapentin  (NEURONTIN ) capsule 100 mg   lactated ringers  infusion   ondansetron  (ZOFRAN ) injection 4 mg   pantoprazole  (PROTONIX ) EC tablet 40 mg   polyethylene glycol (MIRALAX  / GLYCOLAX ) packet 17 g   sodium chloride  flush (NS) 0.9 % injection 3 mL No current facility-administered medications for this visit. No current outpatient medications on file.   Reviewed prior external information including notes and imaging from  primary care provider As well as notes that were available from care everywhere and other healthcare systems.  Past medical history,  social, surgical and family history all reviewed in electronic medical record.  No pertanent information unless stated regarding to the chief complaint.   Review of Systems:  No headache, visual changes, nausea, vomiting, diarrhea, constipation, dizziness, abdominal pain, skin rash, fevers, chills, night sweats, weight loss, swollen lymph nodes, body aches, joint swelling, chest pain, shortness of breath, mood changes. POSITIVE muscle aches  Objective  There were no vitals taken for this visit.   General: No apparent distress alert and oriented x3 mood and affect normal, dressed appropriately.  HEENT: Pupils equal, extraocular movements intact  Respiratory: Patient's speak in full sentences and does not appear short of breath  Cardiovascular: No lower  extremity edema, non tender, no erythema      Impression and Recommendations:

## 2023-10-04 NOTE — Progress Notes (Addendum)
 Progress Note     Subjective: Patient reports improvement in generalized abdominal pain. She reports tolerating CLD. Had a bowel movement last night that was soft and without blood or mucus. She is having flatulence. Reports having nausea. She has spit up bile, but denies vomiting.   ROS:  All negative with the exception of above.  Objective: Vital signs in last 24 hours: Temp:  [98 F (36.7 C)-98.7 F (37.1 C)] 98 F (36.7 C) (07/17 0800) Pulse Rate:  [89-96] 96 (07/17 0800) Resp:  [18] 18 (07/16 1238) BP: (107-131)/(60-80) 131/61 (07/17 0800) SpO2:  [94 %-97 %] 94 % (07/17 0800) Last BM Date : 09/30/23  Intake/Output from previous day: No intake/output data recorded. Intake/Output this shift: No intake/output data recorded.  PE: General: Pleasant, WD, female who is laying in bed in NAD. HEENT: Sclera are non injected and anicteric. Heart: Regular, rate, and rhythm. Lungs: Respiratory effort nonlabored Abd: Soft with mild distention. Mild generalized abdominal tenderness with palpation. No peritonitis.  Psych: A&Ox3 with an appropriate affect.    Lab Results:  Recent Labs    10/03/23 1322 10/04/23 0317  WBC 21.3* 22.8*  HGB 13.8 13.1  HCT 39.6 37.3  PLT 415* 408*   BMET Recent Labs    10/03/23 1322 10/04/23 0317  NA 131* 130*  K 3.4* 3.4*  CL 96* 96*  CO2 24 24  GLUCOSE 92 102*  BUN 9 7*  CREATININE 0.63 0.63  CALCIUM  8.4* 8.3*   PT/INR Recent Labs    10/02/23 1440 10/03/23 1322  LABPROT 15.4* 17.7*  INR 1.5* 1.4*   CMP     Component Value Date/Time   NA 130 (L) 10/04/2023 0317   K 3.4 (L) 10/04/2023 0317   CL 96 (L) 10/04/2023 0317   CO2 24 10/04/2023 0317   GLUCOSE 102 (H) 10/04/2023 0317   BUN 7 (L) 10/04/2023 0317   CREATININE 0.63 10/04/2023 0317   CALCIUM  8.3 (L) 10/04/2023 0317   PROT 5.3 (L) 10/04/2023 0317   ALBUMIN 2.1 (L) 10/04/2023 0317   AST 35 10/04/2023 0317   ALT 36 10/04/2023 0317   ALKPHOS 95 10/04/2023 0317    BILITOT 0.7 10/04/2023 0317   GFRNONAA >60 10/04/2023 0317   GFRAA >60 07/01/2015 1040   Lipase     Component Value Date/Time   LIPASE 64.0 (H) 10/02/2023 1440       Studies/Results: CT ABDOMEN PELVIS W CONTRAST Result Date: 10/03/2023 EXAM: CT ABDOMEN AND PELVIS WITH CONTRAST 10/02/2023 11:34:15 PM TECHNIQUE: CT of the abdomen and pelvis was performed with the administration of intravenous contrast. Multiplanar reformatted images are provided for review. Automated exposure control, iterative reconstruction, and/or weight based adjustment of the mA/kV was utilized to reduce the radiation dose to as low as reasonably achievable. COMPARISON: CT abdomen and pelvis 09/28/2023. CLINICAL HISTORY: Pancreatitis suspected. 73 y/o female. Patient was seen here on 7/11 and diagnosed with pancreatitis. Told to f/u with GI. Doesn't have an appointment until 07/17 and pain has increased. Patient has had total hysterectomy. Patient has NOT had a cholecystectomy as prior MRI; reported per patient. FINDINGS: LOWER CHEST: No acute abnormality. LIVER: Hepatic steatosis. GALLBLADDER AND BILE DUCTS: Cholelithiasis without evidence of acute cholecystitis. The common bile duct is dilated measuring 10 mm in diameter. SPLEEN: No acute abnormality. PANCREAS: Marked edema and stranding about the pancreas compatible with pancreatitis. No organized fluid collection. No evidence of pancreatic necrosis. No pancreatic ductal dilation. The fluid about the pancreas tracks inferiorly in the  anterior perirenal space and central mesentery. The overall extent of fluid and stranding surrounding the pancreas has progressed compared to 09/28/2023. ADRENAL GLANDS: No acute abnormality. KIDNEYS, URETERS AND BLADDER: No stones in the kidneys or ureters. No hydronephrosis. No perinephric or periureteral stranding. GI AND BOWEL: Wall thickening of the gastric antrum and duodenum is likely reactive secondary to pancreatitis. No bowel obstruction.  Normal appendix. PERITONEUM AND RETROPERITONEUM: No ascites. No free air. VASCULATURE: Aortic atherosclerotic calcification. Portal vein, SMV and splenic veins are patent. LYMPH NODES: No lymphadenopathy. REPRODUCTIVE ORGANS: Hysterectomy. No adnexal mass. BONES AND SOFT TISSUES: No acute osseous abnormality. No focal soft tissue abnormality. IMPRESSION: 1. Marked edema and stranding about the pancreas compatible with pancreatitis, progressed compared to 09/28/2023. No organized fluid collection, pancreatic necrosis, or pancreatic ductal dilation. 2. Cholelithiasis without evidence of acute cholecystitis. 3. Wall thickening of the gastric antrum and duodenum, likely reactive secondary to pancreatitis. No bowel obstruction. Electronically signed by: Norman Gatlin MD 10/03/2023 12:30 AM EDT RP Workstation: [id number]   DG Abd 2 Views Result Date: 10/02/2023 EXAM: 2 VIEW XRAY OF THE ABDOMEN 10/02/2023 02:42:45 PM COMPARISON: CT abdomen and pelvis dated 09/28/2023. CLINICAL HISTORY: Abdominal pain, nausea, vomiting, diarrhea since 09/26/2023. Patient fell off a tractor on 09/26/2023, symptoms started thereafter. FINDINGS: BOWEL: Nonobstructive bowel gas pattern. SOFT TISSUES: No abnormal calcifications or opaque urinary calculi. BONES: No acute osseous abnormality. Lower chest: Small bilateral pleural effusions. IMPRESSION: 1. No radiographically apparent acute findings in the abdomen. 2. Small bilateral pleural effusions Electronically signed by: Norman Gatlin MD 10/02/2023 11:59 PM EDT RP Workstation: [id number]    Anti-infectives: Anti-infectives (From admission, onward)    None        Assessment/Plan Acute pancreatitis Cholelithiasis - CT from 7/16 showed marked edema and stranding about the pancreas compatible with pancreatitis, progressed compared to 09/28/2023. No organized fluid collection, pancreatic necrosis, or pancreatic ductal dilation. Cholelithiasis without evidence of acute  cholecystitis. Wall thickening of the gastric antrum and duodenum, likely reactive secondary to pancreatitis. No bowel obstruction. - WBC 22.8 from 21.3 (7/16) - Lipase on 7/15 64.0. On 7/11 >2800.  - Cholecystectomy is indicated but given severity of pancreatitis, will be planned for a later date. General surgery will continue to follow. - GI is following. Planned MRI/MRCP for today.   FEN: CLD, LR infusion @ 125 mL/hr VTE: enoxaparin  injections ID: None   - per [facility] - Hyponatremia Hypokalemia HTN HLD GERD CKD2 Hypothyroidism Neuropathy    LOS: 1 day   I reviewed hospitalist notes, last 24 h vitals and pain scores, last 48 h intake and output, last 24 h labs and trends, and last 24 h imaging results.  This care required moderate level of medical decision making.    Marjorie Carlyon Favre, [facility] 10/04/2023, 8:43 AM Please see Amion for pager number during day hours 7:00am-4:30pm

## 2023-10-04 NOTE — Progress Notes (Addendum)
 Inpatient Progress Note     Patient Profile/Chief Complaint  73 year old female admitted with acute pancreatitis and biliary ductal dilation.  Pancreatitis likely secondary to gallstone pancreatitis.  CT imaging with persistent bile duct dilation-rule out retained stone.   Interval History   -- Abdominal pain continues to improve -- Liver enzymes have normalized, however, leukocytosis persists which may be related to pancreatitis -- Awaiting MRCP for follow-up of persistent ductal dilation on CT to rule out retained bile duct stone    Objective   Vital signs in last 24 hours: Temp:  [98 F (36.7 C)-98.4 F (36.9 C)] 98.4 F (36.9 C) (07/17 1215) Pulse Rate:  [87-96] 87 (07/17 1629) Resp:  [18] 18 (07/17 1215) BP: (123-131)/(61-72) 125/72 (07/17 1629) SpO2:  [90 %-95 %] 95 % (07/17 1629) Last BM Date : 09/30/23 General:    Alert, resting in bed no distress Heart:  Regular rate and rhythm; no murmurs Lungs: Respirations even and unlabored, lungs CTA bilaterally Abdomen:  Soft, minimal tenderness to palpation in the epigastrium and nondistended. Normal bowel sounds. Extremities:  Without edema. Neurologic:  Alert and oriented,  grossly normal neurologically. Psych:  Cooperative. Normal mood and affect.  Intake/Output from previous day: No intake/output data recorded. Intake/Output this shift: Total I/O In: 1843 [P.O.:840; I.V.:1003] Out: -   Lab Results: Recent Labs    10/02/23 1440 10/03/23 1322 10/04/23 0317  WBC 15.9* 21.3* 22.8*  HGB 14.1 13.8 13.1  HCT 42.5 39.6 37.3  PLT 421.0* 415* 408*   BMET Recent Labs    10/02/23 1440 10/03/23 1322 10/04/23 0317  NA 127* 131* 130*  K 3.1* 3.4* 3.4*  CL 90* 96* 96*  CO2 30 24 24   GLUCOSE 97 92 102*  BUN 15 9 7*  CREATININE 0.73 0.63 0.63  CALCIUM  8.7 8.4* 8.3*   LFT Recent Labs    10/04/23 0317  PROT 5.3*  ALBUMIN 2.1*  AST 35  ALT 36  ALKPHOS 95  BILITOT 0.7   PT/INR Recent Labs     10/02/23 1440 10/03/23 1322  LABPROT 15.4* 17.7*  INR 1.5* 1.4*    Studies/Results: CT ABDOMEN PELVIS W CONTRAST Result Date: 10/03/2023 EXAM: CT ABDOMEN AND PELVIS WITH CONTRAST 10/02/2023 11:34:15 PM TECHNIQUE: CT of the abdomen and pelvis was performed with the administration of intravenous contrast. Multiplanar reformatted images are provided for review. Automated exposure control, iterative reconstruction, and/or weight based adjustment of the mA/kV was utilized to reduce the radiation dose to as low as reasonably achievable. COMPARISON: CT abdomen and pelvis 09/28/2023. CLINICAL HISTORY: Pancreatitis suspected. 73 y/o female. Patient was seen here on 7/11 and diagnosed with pancreatitis. Told to f/u with GI. Doesn't have an appointment until 07/17 and pain has increased. Patient has had total hysterectomy. Patient has NOT had a cholecystectomy as prior MRI; reported per patient. FINDINGS: LOWER CHEST: No acute abnormality. LIVER: Hepatic steatosis. GALLBLADDER AND BILE DUCTS: Cholelithiasis without evidence of acute cholecystitis. The common bile duct is dilated measuring 10 mm in diameter. SPLEEN: No acute abnormality. PANCREAS: Marked edema and stranding about the pancreas compatible with pancreatitis. No organized fluid collection. No evidence of pancreatic necrosis. No pancreatic ductal dilation. The fluid about the pancreas tracks inferiorly in the anterior perirenal space and central mesentery. The overall extent of fluid and stranding surrounding the pancreas has progressed compared to 09/28/2023. ADRENAL GLANDS: No acute abnormality. KIDNEYS, URETERS AND BLADDER: No stones in the kidneys or ureters. No hydronephrosis. No perinephric or periureteral stranding. GI AND  BOWEL: Wall thickening of the gastric antrum and duodenum is likely reactive secondary to pancreatitis. No bowel obstruction. Normal appendix. PERITONEUM AND RETROPERITONEUM: No ascites. No free air. VASCULATURE: Aortic  atherosclerotic calcification. Portal vein, SMV and splenic veins are patent. LYMPH NODES: No lymphadenopathy. REPRODUCTIVE ORGANS: Hysterectomy. No adnexal mass. BONES AND SOFT TISSUES: No acute osseous abnormality. No focal soft tissue abnormality. IMPRESSION: 1. Marked edema and stranding about the pancreas compatible with pancreatitis, progressed compared to 09/28/2023. No organized fluid collection, pancreatic necrosis, or pancreatic ductal dilation. 2. Cholelithiasis without evidence of acute cholecystitis. 3. Wall thickening of the gastric antrum and duodenum, likely reactive secondary to pancreatitis. No bowel obstruction. Electronically signed by: Norman Gatlin MD 10/03/2023 12:30 AM EDT RP Workstation: [id number]    Endoscopic Studies: None   Clinical Impression   73 year old female admitted with acute pancreatitis onset 09/27/2023.  Initially evaluated in the ER on 09/28/2023 with diagnosis of acute pancreatitis and discharged but had ongoing symptoms with progress in abdominal pain and nausea.  She was admitted for further evaluation with initial imaging showing stones in the gallbladder neck and dilated CBD with enhancement of the ampulla with a punctate stone in the first portion of the duodenum.  She initially had mild elevation of her liver function tests which have normalized.  Lipase also normalized.  Clinically she is improving with improved abdominal pain.  We have recommended an MRI/MRCP to confirm no evidence of retained bile duct stone.   Plan  MRI/MRCP ordered Continue monitoring daily LFTs and CBC Continue antiemetics and pain control as needed Continue clear liquid diet Anticipate she will require cholecystectomy in the future -has been seen by general surgery with plan for surgery once stable from pancreatitis standpoint.   LOS: 1 day   Abigail Robinson  10/04/2023, 6:52 PM  Abigail Hausen, MD [facility]

## 2023-10-04 NOTE — Evaluation (Signed)
 Physical Therapy Evaluation Patient Details Name: Abigail Robinson MRN: [id number] DOB: 01/10/1951 Today's Date: 10/04/2023  History of Present Illness  Pt is a 73 y.o. female admitted 7/15 with acute pancreatitis. She also had a recent fall from a tractor (7/9) sustaining head laceration. Sutures in place. PMH:  HTN, hyperlipidemia, GERD, CKD 2, hypothyroidism, BPPV, neuropathy, hysterectomy, DDD   Clinical Impression  Pt admitted with above diagnosis. 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 Pt currently with functional limitations due to the deficits listed below (see PT Problem List). On eval, pt demo mod I bed mobility. Supervision transfers and amb 300' with RW. Pt will benefit from acute skilled PT to increase their independence and safety with mobility to allow discharge. PT to follow acutely. No follow up services indicated.           If plan is discharge home, recommend the following: Assistance with cooking/housework;Help with stairs or ramp for entrance;Assist for transportation   Can travel by private vehicle        Equipment Recommendations Rolling walker (2 wheels)  Recommendations for Other Services       Functional Status Assessment Patient has had a recent decline in their functional status and demonstrates the ability to make significant improvements in function in a reasonable and predictable amount of time.     Precautions / Restrictions Precautions Precautions: Fall Recall of Precautions/Restrictions: Intact Precaution/Restrictions Comments: Multi recent falls per chart. Pt states it's only because I've been doing things I shouldn't. (i.e. yard work, Paediatric nurse, Catering manager)      Mobility  Bed Mobility Overal bed mobility: Modified Independent             General bed mobility comments: +rails    Transfers Overall transfer level: Needs assistance Equipment used: Ambulation equipment used Transfers: Sit to/from Stand Sit to Stand:  Supervision                Ambulation/Gait Ambulation/Gait assistance: Supervision Gait Distance (Feet): 300 Feet Assistive device: Rolling walker (2 wheels) Gait Pattern/deviations: Step-through pattern, Decreased stride length Gait velocity: decreased Gait velocity interpretation: <1.31 ft/sec, indicative of household ambulator   General Gait Details: steady gait with RW. Cues for proximity to RW.  Stairs            Wheelchair Mobility     Tilt Bed    Modified Rankin (Stroke Patients Only)       Balance Overall balance assessment: Mild deficits observed, not formally tested                                           Pertinent Vitals/Pain Pain Assessment Pain Assessment: Faces Faces Pain Scale: Hurts a little bit Pain Location: abdomen Pain Descriptors / Indicators: Discomfort Pain Intervention(s): Monitored during session    Home Living Family/patient expects to be discharged to:: Private residence Living Arrangements: Alone Available Help at Discharge: Family;Friend(s);Available PRN/intermittently Type of Home: House Home Access: Stairs to enter Entrance Stairs-Rails: Doctor, general practice of Steps: 2   Home Layout: Able to live on main level with bedroom/bathroom Home Equipment: None Additional Comments: 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 She tends to all the animals.    Prior Function Prior Level of Function : Independent/Modified Independent;Driving                     Extremity/Trunk Assessment  Upper Extremity Assessment Upper Extremity Assessment: Overall WFL for tasks assessed    Lower Extremity Assessment Lower Extremity Assessment: Overall WFL for tasks assessed    Cervical / Trunk Assessment Cervical / Trunk Assessment: Normal  Communication   Communication Communication: No apparent difficulties    Cognition Arousal: Alert Behavior During Therapy: WFL for tasks assessed/performed   PT - Cognitive  impairments: No apparent impairments                         Following commands: Intact       Cueing Cueing Techniques: Verbal cues     General Comments General comments (skin integrity, edema, etc.): VSS on RA    Exercises     Assessment/Plan    PT Assessment Patient needs continued PT services  PT Problem List Pain;Decreased activity tolerance;Decreased knowledge of use of DME;Decreased balance;Decreased mobility       PT Treatment Interventions DME instruction;Therapeutic exercise;Gait training;Balance training;Functional mobility training;Therapeutic activities;Patient/family education;Stair training    PT Goals (Current goals can be found in the Care Plan section)  Acute Rehab PT Goals Patient Stated Goal: home PT Goal Formulation: With patient Time For Goal Achievement: 10/18/23 Potential to Achieve Goals: Good    Frequency Min 2X/week     Co-evaluation               AM-PAC PT 6 Clicks Mobility  Outcome Measure Help needed turning from your back to your side while in a flat bed without using bedrails?: None Help needed moving from lying on your back to sitting on the side of a flat bed without using bedrails?: A Little Help needed moving to and from a bed to a chair (including a wheelchair)?: A Little Help needed standing up from a chair using your arms (e.g., wheelchair or bedside chair)?: A Little Help needed to walk in hospital room?: A Little Help needed climbing 3-5 steps with a railing? : A Little 6 Click Score: 19    End of Session Equipment Utilized During Treatment: Gait belt Activity Tolerance: Patient tolerated treatment well Patient left: in bed;with call bell/phone within reach Nurse Communication: Mobility status PT Visit Diagnosis: Difficulty in walking, not elsewhere classified (R26.2)    Time: 1041-1100 PT Time Calculation (min) (ACUTE ONLY): 19 min   Charges:   PT Evaluation $PT Eval Low Complexity: 1 Low   PT  General Charges $$ ACUTE PT VISIT: 1 Visit         Sari MATSU., PT  Office # [phone]   Erven Sari Shaker 10/04/2023, 11:53 AM

## 2023-10-04 NOTE — Progress Notes (Signed)
 Progress Note   Patient: Abigail Robinson FMW:[id number] DOB: [birth year] DOA: 10/02/2023     1 DOS: the patient was seen and examined on 10/04/2023   Brief hospital course:   73 y.o. female with medical history significant of hypertension, hyperlipidemia, GERD, CKD 2, hypothyroidism, BPPV, neuropathy, hysterectomy, DDD, presented with ongoing abdominal pain, diagnosed with acute interstitial edematous pancreatitis likely secondary to gallstones and both GI and surgical team on board with plan for cholecystectomy later.  Assessment and Plan:  Acute interstitial pancreatitis, POA: Likely gallstone pancreatitis. Initial lipase level was greater than 2800.  Most recent lipase level of 64.  Patient's Pain is improving CT abdomen pelvis showed evidence of gallstones in the neck of the gallbladder and mild diffuse extrahepatic biliary ductal dilatation with duct measuring up to 9 mm as well as a small hypodensity in the pancreatic neck measuring 5 mm.  There is associated peripancreatic inflammation consistent with pancreatitis. She is current on intravenous fluids and clear liquid diets.  She is having bowel movements and the pain is improving. Continue with as needed analgesics GI on board.  Plan for MRI/MRCP General Surgical team on board.  Patient will need cholecystectomy later during the hospitalization.  Hyponatremia, POA: Likely hypovolemic hyponatremia.  Continue with intravenous fluids and follow sodium levels closely  Hypokalemia, POA: As needed repletion  Essential hypertension, POA: As needed intravenous hydralazine for systolic blood pressure greater than 160 Continue with atenolol   GERD, POA: Continue with PPI  Hypothyroidism: Continue with Synthroid   Neuropathy: Continue with gabapentin    DVT prophylaxis: Lovenox   Disposition: Home.  Patient lives at home by herself and is independent in activities of daily life.     Subjective: She is complaining of abdominal pain but  that is improved as compared to yesterday.  She is currently rating it at 3 out of 10.  She said that yesterday the pain was 10 out of 10.  She lives at home by herself but she does have a friend who frequently checks up on her.  She did admit that she had a bowel movement last night.  She is tolerating clear liquid diet.  Physical Exam: Vitals:   10/03/23 0715 10/03/23 1051 10/03/23 1238 10/04/23 0800  BP:  107/60 116/80 131/61  Pulse: 91 94 89 96  Resp:  18 18   Temp:  98.7 F (37.1 C) 98.2 F (36.8 C) 98 F (36.7 C)  TempSrc:  Oral Oral Oral  SpO2: 96% 95% 97% 94%  Weight:      Height:       Constitutional: Alert and awake, in mild distress Eyes: PERRL, lids and conjunctivae normal ENMT: Mucous membranes are moist. Posterior pharynx clear of any exudate or lesions.Normal dentition.  Neck: normal, supple, no masses, no thyromegaly Respiratory: clear to auscultation bilaterally, no wheezing, no crackles. Normal respiratory effort. No accessory muscle use.  Cardiovascular: Regular rate and rhythm, no murmurs / rubs / gallops. No extremity edema. 2+ pedal pulses. No carotid bruits.  Abdomen: Slight tenderness to palpation in the epigastric region, no masses palpated. No hepatosplenomegaly. Bowel sounds positive.  Musculoskeletal: no clubbing / cyanosis. No joint deformity upper and lower extremities. Good ROM, no contractures. Normal muscle tone.  Skin: no rashes, lesions, ulcers. No induration Neurologic: CN 2-12 grossly intact. Sensation intact, DTR normal. Strength 5/5 x all 4 extremities.  Psychiatric: Normal judgment and insight. Alert and oriented x 3. Normal mood.   Data Reviewed:  There are no new results to review at this  time.  Family Communication: None at the bedside  Disposition: Status is: Inpatient Remains inpatient appropriate because: Acute pancreatitis  Planned Discharge Destination: Home    Time spent: 42 minutes  Author: Deliliah Room, MD 10/04/2023 9:45  AM  For on call review [email or web address].

## 2023-10-04 NOTE — Progress Notes (Signed)
 Pt returned from diagnostics.

## 2023-10-05 DIAGNOSIS — K8689 Other specified diseases of pancreas: Secondary | ICD-10-CM

## 2023-10-05 DIAGNOSIS — K851 Biliary acute pancreatitis without necrosis or infection: Secondary | ICD-10-CM | POA: Diagnosis not present

## 2023-10-05 LAB — COMPREHENSIVE METABOLIC PANEL WITH GFR
ALT: 36 U/L (ref 0–44)
AST: 47 U/L — ABNORMAL HIGH (ref 15–41)
Albumin: 1.8 g/dL — ABNORMAL LOW (ref 3.5–5.0)
Alkaline Phosphatase: 96 U/L (ref 38–126)
Anion gap: 10 (ref 5–15)
BUN: 5 mg/dL — ABNORMAL LOW (ref 8–23)
CO2: 25 mmol/L (ref 22–32)
Calcium: 8.3 mg/dL — ABNORMAL LOW (ref 8.9–10.3)
Chloride: 95 mmol/L — ABNORMAL LOW (ref 98–111)
Creatinine, Ser: 0.59 mg/dL (ref 0.44–1.00)
GFR, Estimated: >60 mL/min (ref >60)
Glucose, Bld: 91 mg/dL (ref 70–99)
Potassium: 3 mmol/L — ABNORMAL LOW (ref 3.5–5.1)
Sodium: 130 mmol/L — ABNORMAL LOW (ref 135–145)
Total Bilirubin: 0.7 mg/dL (ref 0.0–1.2)
Total Protein: 4.9 g/dL — ABNORMAL LOW (ref 6.5–8.1)

## 2023-10-05 LAB — CBC WITH DIFFERENTIAL/PLATELET
Abs Immature Granulocytes: 0 K/uL (ref 0.00–0.07)
Basophils Absolute: 0 K/uL (ref 0.0–0.1)
Basophils Relative: 0 %
Eosinophils Absolute: 0 K/uL (ref 0.0–0.5)
Eosinophils Relative: 0 %
HCT: 35.5 % — ABNORMAL LOW (ref 36.0–46.0)
Hemoglobin: 12.4 g/dL (ref 12.0–15.0)
Lymphocytes Relative: 6 %
Lymphs Abs: 1.4 K/uL (ref 0.7–4.0)
MCH: 31.4 pg (ref 26.0–34.0)
MCHC: 34.9 g/dL (ref 30.0–36.0)
MCV: 89.9 fL (ref 80.0–100.0)
Monocytes Absolute: 2.1 K/uL — ABNORMAL HIGH (ref 0.1–1.0)
Monocytes Relative: 9 %
Neutro Abs: 19.5 K/uL — ABNORMAL HIGH (ref 1.7–7.7)
Neutrophils Relative %: 85 %
Platelets: 395 K/uL (ref 150–400)
RBC: 3.95 MIL/uL (ref 3.87–5.11)
RDW: 13.9 % (ref 11.5–15.5)
WBC: 22.9 K/uL — ABNORMAL HIGH (ref 4.0–10.5)
nRBC: 0 % (ref 0.0–0.2)
nRBC: 0 /100{WBCs}

## 2023-10-05 NOTE — Plan of Care (Signed)

## 2023-10-05 NOTE — Progress Notes (Signed)
 Progress Note   Patient: Abigail Robinson FMW:[id number] DOB: September 22, 1950 DOA: 10/02/2023     2 DOS: the patient was seen and examined on 10/05/2023   Brief hospital course:   73 y.o. female with medical history significant of hypertension, hyperlipidemia, GERD, CKD 2, hypothyroidism, BPPV, neuropathy, hysterectomy, DDD, presented with ongoing abdominal pain, diagnosed with acute interstitial edematous pancreatitis likely secondary to gallstones and both GI and surgical team on board with plan for cholecystectomy later.  Assessment and Plan:  Acute interstitial pancreatitis, POA: Likely gallstone pancreatitis. Initial lipase level was greater than 2800.  Most recent lipase level of 64.  Patient's Pain is improving CT abdomen pelvis showed evidence of gallstones in the neck of the gallbladder and mild diffuse extrahepatic biliary ductal dilatation with duct measuring up to 9 mm as well as a small hypodensity in the pancreatic neck measuring 5 mm.  There is associated peripancreatic inflammation consistent with pancreatitis. She is current on intravenous fluids and clear liquid diets.  She is having bowel movements and the pain is improving. Continue with as needed analgesics GI on board.   MRCP done on 7/17 showed Mild extrahepatic biliary ductal dilatation, no pancreatic necrosis and severe pancreatic inflammation. General Surgical team on board.  Patient will need cholecystectomy later once acute episode has resolved.  Hyponatremia, POA: Likely hypovolemic hyponatremia.  Continue with intravenous fluids and follow sodium levels closely  Hypokalemia, POA: As needed repletion  Essential hypertension, POA: As needed intravenous hydralazine for systolic blood pressure greater than 160 Continue with atenolol   GERD, POA: Continue with PPI  Hypothyroidism: Continue with Synthroid   Neuropathy: Continue with gabapentin    DVT prophylaxis: Lovenox   Disposition: Home.  Patient lives at home by  herself and is independent in activities of daily life.     Subjective: She is on a clear liquid diet and is doing better today. She rated pain at 1/10. MRCP done yesterday, GI and surgery on board.  Physical Exam: Vitals:   10/04/23 2039 10/05/23 0021 10/05/23 0426 10/05/23 0812  BP: (!) 135/59 125/76 112/64 114/60  Pulse: 93 84 86 90  Resp: 18 18 18 16   Temp: 98.6 F (37 C) 99.4 F (37.4 C) 97.9 F (36.6 C) 98.1 F (36.7 C)  TempSrc:  Oral Oral   SpO2: 94% 91% 92% 92%  Weight:      Height:       Constitutional: Alert and awake, in mild distress Eyes: PERRL, lids and conjunctivae normal ENMT: Mucous membranes are moist. Posterior pharynx clear of any exudate or lesions.Normal dentition.  Neck: normal, supple, no masses, no thyromegaly Respiratory: clear to auscultation bilaterally, no wheezing, no crackles. Normal respiratory effort. No accessory muscle use.  Cardiovascular: Regular rate and rhythm, no murmurs / rubs / gallops. No extremity edema. 2+ pedal pulses. No carotid bruits.  Abdomen: Slight tenderness to palpation in the epigastric region, no masses palpated. No hepatosplenomegaly. Bowel sounds positive.  Musculoskeletal: no clubbing / cyanosis. No joint deformity upper and lower extremities. Good ROM, no contractures. Normal muscle tone.  Skin: no rashes, lesions, ulcers. No induration Neurologic: CN 2-12 grossly intact. Sensation intact, DTR normal. Strength 5/5 x all 4 extremities.  Psychiatric: Normal judgment and insight. Alert and oriented x 3. Normal mood.   Data Reviewed:  There are no new results to review at this time.  Family Communication: None at the bedside  Disposition: Status is: Inpatient Remains inpatient appropriate because: Acute pancreatitis  Planned Discharge Destination: Home    Time spent:  41 minutes  Author: Deliliah Room, MD 10/05/2023 9:50 AM  For on call review [email or web address].

## 2023-10-05 NOTE — Progress Notes (Signed)
 Progress Note     Subjective: Patient reports continued generalized abdominal pain. While being active, the pain intensifies, but when resting, she feels that it improves. Patient had one bowel movement yesterday that was black and soft. Denies hematochezia and mucus of stool. Has not had as much flatulence. Patient has tolerated CLD well. Denies nausea and vomiting.  ROS  All negative with the exception of above.  Objective: Vital signs in last 24 hours: Temp:  [97.9 F (36.6 C)-99.4 F (37.4 C)] 97.9 F (36.6 C) (07/18 0426) Pulse Rate:  [84-93] 86 (07/18 0426) Resp:  [18] 18 (07/18 0426) BP: (112-135)/(59-76) 112/64 (07/18 0426) SpO2:  [90 %-95 %] 92 % (07/18 0426) Last BM Date : 09/30/23  Intake/Output from previous day: 07/17 0701 - 07/18 0700 In: 1843 [P.O.:840; I.V.:1003] Out: -  Intake/Output this shift: No intake/output data recorded.  PE: General: Pleasant, WD, female who is laying in bed in NA. HEENT: Sclera are non injected and anicteric.  Lungs: Respiratory effort nonlabored Abd: Soft and non distended. Mild generalized abdominal tenderness with palpation that is more pronounced of the lower abdomen. No peritonitis. Psych: A&Ox3 with an appropriate affect.    Lab Results:  Recent Labs    10/04/23 0317 10/05/23 0204  WBC 22.8* 22.9*  HGB 13.1 12.4  HCT 37.3 35.5*  PLT 408* 395   BMET Recent Labs    10/04/23 0317 10/05/23 0204  NA 130* 130*  K 3.4* 3.0*  CL 96* 95*  CO2 24 25  GLUCOSE 102* 91  BUN 7* 5*  CREATININE 0.63 0.59  CALCIUM  8.3* 8.3*   PT/INR Recent Labs    10/02/23 1440 10/03/23 1322  LABPROT 15.4* 17.7*  INR 1.5* 1.4*   CMP     Component Value Date/Time   NA 130 (L) 10/05/2023 0204   K 3.0 (L) 10/05/2023 0204   CL 95 (L) 10/05/2023 0204   CO2 25 10/05/2023 0204   GLUCOSE 91 10/05/2023 0204   BUN 5 (L) 10/05/2023 0204   CREATININE 0.59 10/05/2023 0204   CALCIUM  8.3 (L) 10/05/2023 0204   PROT 4.9 (L) 10/05/2023  0204   ALBUMIN 1.8 (L) 10/05/2023 0204   AST 47 (H) 10/05/2023 0204   ALT 36 10/05/2023 0204   ALKPHOS 96 10/05/2023 0204   BILITOT 0.7 10/05/2023 0204   GFRNONAA >60 10/05/2023 0204   GFRAA >60 07/01/2015 1040   Lipase     Component Value Date/Time   LIPASE 64.0 (H) 10/02/2023 1440       Studies/Results: MR ABDOMEN MRCP W WO CONTAST Result Date: 10/04/2023 CLINICAL DATA:  Pancreatitis, rule out common bile duct stone, dilated common bile duct by CT EXAM: MRI ABDOMEN WITHOUT AND WITH CONTRAST (INCLUDING MRCP) TECHNIQUE: Multiplanar multisequence MR imaging of the abdomen was performed both before and after the administration of intravenous contrast. Heavily T2-weighted images of the biliary and pancreatic ducts were obtained, and three-dimensional MRCP images were rendered by post processing. CONTRAST:  7.5mL GADAVIST  GADOBUTROL  1 MMOL/ML IV SOLN COMPARISON:  CT abdomen pelvis, 10/02/2023 FINDINGS: Lower chest: Small bilateral pleural effusions and associated atelectasis or consolidation. Hepatobiliary: No solid liver abnormality is seen. No gallstones or gallbladder wall thickening. Mild extrahepatic biliary ductal dilatation, the common hepatic duct measuring up to 0.9 cm in caliber. Incidental note of a very low insertion of the cystic duct within the pancreatic head (series 16, image 17). Duct tapers smoothly to the ampulla without calculus or other obstruction. Pancreas: Similar appearance of very extensive  inflammatory fat stranding and fluid about the pancreas and adjacent retroperitoneum, perhaps somewhat more organized on today's examination with some evidence of rim enhancement within the central small bowel mesentery (series 1402, image 69). 0.9 cm fluid signal cystic lesion within the pancreatic neck (series 16, image 16). Spleen: Normal in size without significant abnormality. Adrenals/Urinary Tract: Adrenal glands are unremarkable. Kidneys are normal, without renal calculi, solid  lesion, or hydronephrosis. Stomach/Bowel: Stomach is within normal limits. No evidence of bowel wall thickening, distention, or inflammatory changes. Vascular/Lymphatic: No significant vascular findings are present. No enlarged abdominal lymph nodes. Other: Anasarca.  Trace perihepatic ascites. Musculoskeletal: No acute or significant osseous findings. IMPRESSION: 1. Similar appearance of very extensive inflammatory fat stranding and fluid about the pancreas and adjacent retroperitoneum, perhaps somewhat more organized on today's examination with some evidence of rim enhancement within the central small bowel mesentery. Findings are consistent with acute pancreatitis and suspicious for organizing acute pancreatic fluid collections. Consider close interval follow-up. No evidence of pancreatic parenchymal necrosis. 2. Mild extrahepatic biliary ductal dilatation, the common hepatic duct measuring up to 0.9 cm in caliber. Duct tapers smoothly to the ampulla without calculus or other obstruction. Incidental note of a very low insertion of the cystic duct within the pancreatic head. 3. Pleural effusions, trace ascites, and anasarca. Electronically Signed   By: Marolyn JONETTA Jaksch M.D.   On: 10/04/2023 22:09   MR 3D Recon At Scanner Result Date: 10/04/2023 CLINICAL DATA:  Pancreatitis, rule out common bile duct stone, dilated common bile duct by CT EXAM: MRI ABDOMEN WITHOUT AND WITH CONTRAST (INCLUDING MRCP) TECHNIQUE: Multiplanar multisequence MR imaging of the abdomen was performed both before and after the administration of intravenous contrast. Heavily T2-weighted images of the biliary and pancreatic ducts were obtained, and three-dimensional MRCP images were rendered by post processing. CONTRAST:  7.5mL GADAVIST  GADOBUTROL  1 MMOL/ML IV SOLN COMPARISON:  CT abdomen pelvis, 10/02/2023 FINDINGS: Lower chest: Small bilateral pleural effusions and associated atelectasis or consolidation. Hepatobiliary: No solid liver  abnormality is seen. No gallstones or gallbladder wall thickening. Mild extrahepatic biliary ductal dilatation, the common hepatic duct measuring up to 0.9 cm in caliber. Incidental note of a very low insertion of the cystic duct within the pancreatic head (series 16, image 17). Duct tapers smoothly to the ampulla without calculus or other obstruction. Pancreas: Similar appearance of very extensive inflammatory fat stranding and fluid about the pancreas and adjacent retroperitoneum, perhaps somewhat more organized on today's examination with some evidence of rim enhancement within the central small bowel mesentery (series 1402, image 69). 0.9 cm fluid signal cystic lesion within the pancreatic neck (series 16, image 16). Spleen: Normal in size without significant abnormality. Adrenals/Urinary Tract: Adrenal glands are unremarkable. Kidneys are normal, without renal calculi, solid lesion, or hydronephrosis. Stomach/Bowel: Stomach is within normal limits. No evidence of bowel wall thickening, distention, or inflammatory changes. Vascular/Lymphatic: No significant vascular findings are present. No enlarged abdominal lymph nodes. Other: Anasarca.  Trace perihepatic ascites. Musculoskeletal: No acute or significant osseous findings. IMPRESSION: 1. Similar appearance of very extensive inflammatory fat stranding and fluid about the pancreas and adjacent retroperitoneum, perhaps somewhat more organized on today's examination with some evidence of rim enhancement within the central small bowel mesentery. Findings are consistent with acute pancreatitis and suspicious for organizing acute pancreatic fluid collections. Consider close interval follow-up. No evidence of pancreatic parenchymal necrosis. 2. Mild extrahepatic biliary ductal dilatation, the common hepatic duct measuring up to 0.9 cm in caliber. Duct tapers smoothly to the  ampulla without calculus or other obstruction. Incidental note of a very low insertion of the  cystic duct within the pancreatic head. 3. Pleural effusions, trace ascites, and anasarca. Electronically Signed   By: Marolyn JONETTA Jaksch M.D.   On: 10/04/2023 22:09    Anti-infectives: Anti-infectives (From admission, onward)    None        Assessment/Plan Acute pancreatitis Cholelithiasis - CT from 7/16 showed marked edema and stranding about the pancreas compatible with pancreatitis, progressed compared to 09/28/2023. No organized fluid collection, pancreatic necrosis, or pancreatic ductal dilation. Cholelithiasis without evidence of acute cholecystitis. Wall thickening of the gastric antrum and duodenum, likely reactive secondary to pancreatitis. No bowel obstruction. - MRCP from 7/17 showed very extensive inflammatory fat stranding and fluid about the pancreas and adjacent retroperitoneum, perhaps somewhat more organized on examination with some evidence of rim enhancement within the central small bowel mesentery. Findings are consistent with acute pancreatitis and suspicious for organizing acute pancreatic fluid collections. No evidence of pancreatic parenchymal necrosis.Mild extrahepatic biliary ductal dilatation, the common hepatic duct measuring up to 0.9 cm in caliber. Duct tapers smoothly to the ampulla without calculus or other obstruction. Incidental note of a very low insertion of the cystic duct within the pancreatic head. Pleural effusions, trace ascites, and anasarca. - WBC 22.9 from 22.8 (7/17) - Lipase on 7/15 was 64.0. On 7/11 >2800.  - Cholecystectomy is indicated but given severity of pancreatitis, will be planned for a later date. General surgery will continue to follow. - GI is also following.     FEN: CLD, Sodium Chloride  flush 0.9% injection 3mL every 12 hours VTE: Enoxaparin  injections ID: None     - per [facility] - Hyponatremia Hypokalemia HTN HLD GERD CKD2 Hypothyroidism Neuropathy     LOS: 2 days   I reviewed hospitalist notes, last 24 h vitals and pain  scores, last 48 h intake and output, last 24 h labs and trends, and last 24 h imaging results.  This care required moderate level of medical decision making.    Marjorie Carlyon Favre, [facility] Surgery 10/05/2023, 8:05 AM Please see Amion for pager number during day hours 7:00am-4:30pm

## 2023-10-05 NOTE — Progress Notes (Signed)
 Mobility Specialist: Progress Note   10/05/23 1039  Mobility  Activity Ambulated with assistance in hallway  Level of Assistance Standby assist, set-up cues, supervision of patient - no hands on  Assistive Device None  Distance Ambulated (ft) 475 ft  Activity Response Tolerated well  Mobility Referral Yes  Mobility visit 1 Mobility  Mobility Specialist Start Time (ACUTE ONLY) [id number]  Mobility Specialist Stop Time (ACUTE ONLY) 0924  Mobility Specialist Time Calculation (min) (ACUTE ONLY) 8 min    Pt received on EOB, agreeable to mobility session. SV throughout. Ambulated w/o AD, slight unsteadiness with a few occurrences of R sided drift but no overt LOB or physical assist needed. Returned to room without fault. Left on EOB with all needs met, call bell in reach.   Abigail Robinson Mobility Specialist Please contact via SecureChat or Rehab office at [phone]

## 2023-10-05 NOTE — Progress Notes (Signed)
 Inpatient Progress Note     Patient Profile/Chief Complaint  73 year old female admitted with acute pancreatitis and biliary ductal dilation.  Pancreatitis potentially secondary to gallstone pancreatitis.  MRCP imaging 10/04/2023 does not show retained stone but extensive peripancreatic inflammation and organizing fluid collections   Interval History   -- Abdominal pain continues to improve -- MRCP did not show retained bile duct stone but ongoing peripancreatic inflammation and possible organizing pancreatic fluid collections. -- Persistent leukocytosis with WBC 22.9 but remains afebrile -Tmax 99.4    Objective   Vital signs in last 24 hours: Temp:  [97.9 F (36.6 C)-99.4 F (37.4 C)] 98.2 F (36.8 C) (07/18 1135) Pulse Rate:  [81-93] 81 (07/18 1135) Resp:  [16-18] 16 (07/18 0812) BP: (108-135)/(59-76) 108/74 (07/18 1135) SpO2:  [91 %-97 %] 97 % (07/18 1135) Last BM Date : 10/05/23 General:    Alert, resting in bed no distress Heart:  Regular rate and rhythm; no murmurs Lungs: Respirations even and unlabored, lungs CTA bilaterally Abdomen:  Soft, minimal tenderness to palpation in the epigastrium and nondistended. Normal bowel sounds. Extremities:  Without edema. Neurologic:  Alert and oriented,  grossly normal neurologically. Psych:  Cooperative. Normal mood and affect.  Intake/Output from previous day: 07/17 0701 - 07/18 0700 In: 1843 [P.O.:840; I.V.:1003] Out: -  Intake/Output this shift: Total I/O In: 3 [I.V.:3] Out: -   Lab Results: Recent Labs    10/03/23 1322 10/04/23 0317 10/05/23 0204  WBC 21.3* 22.8* 22.9*  HGB 13.8 13.1 12.4  HCT 39.6 37.3 35.5*  PLT 415* 408* 395   BMET Recent Labs    10/03/23 1322 10/04/23 0317 10/05/23 0204  NA 131* 130* 130*  K 3.4* 3.4* 3.0*  CL 96* 96* 95*  CO2 24 24 25   GLUCOSE 92 102* 91  BUN 9 7* 5*  CREATININE 0.63 0.63 0.59  CALCIUM  8.4* 8.3* 8.3*   LFT Recent Labs    10/05/23 0204  PROT 4.9*  ALBUMIN  1.8*  AST 47*  ALT 36  ALKPHOS 96  BILITOT 0.7   PT/INR Recent Labs    10/03/23 1322  LABPROT 17.7*  INR 1.4*    Studies/Results: MR ABDOMEN MRCP W WO CONTAST Result Date: 10/04/2023 CLINICAL DATA:  Pancreatitis, rule out common bile duct stone, dilated common bile duct by CT EXAM: MRI ABDOMEN WITHOUT AND WITH CONTRAST (INCLUDING MRCP) TECHNIQUE: Multiplanar multisequence MR imaging of the abdomen was performed both before and after the administration of intravenous contrast. Heavily T2-weighted images of the biliary and pancreatic ducts were obtained, and three-dimensional MRCP images were rendered by post processing. CONTRAST:  7.5mL GADAVIST  GADOBUTROL  1 MMOL/ML IV SOLN COMPARISON:  CT abdomen pelvis, 10/02/2023 FINDINGS: Lower chest: Small bilateral pleural effusions and associated atelectasis or consolidation. Hepatobiliary: No solid liver abnormality is seen. No gallstones or gallbladder wall thickening. Mild extrahepatic biliary ductal dilatation, the common hepatic duct measuring up to 0.9 cm in caliber. Incidental note of a very low insertion of the cystic duct within the pancreatic head (series 16, image 17). Duct tapers smoothly to the ampulla without calculus or other obstruction. Pancreas: Similar appearance of very extensive inflammatory fat stranding and fluid about the pancreas and adjacent retroperitoneum, perhaps somewhat more organized on today's examination with some evidence of rim enhancement within the central small bowel mesentery (series 1402, image 69). 0.9 cm fluid signal cystic lesion within the pancreatic neck (series 16, image 16). Spleen: Normal in size without significant abnormality. Adrenals/Urinary Tract: Adrenal glands are unremarkable.  Kidneys are normal, without renal calculi, solid lesion, or hydronephrosis. Stomach/Bowel: Stomach is within normal limits. No evidence of bowel wall thickening, distention, or inflammatory changes. Vascular/Lymphatic: No  significant vascular findings are present. No enlarged abdominal lymph nodes. Other: Anasarca.  Trace perihepatic ascites. Musculoskeletal: No acute or significant osseous findings. IMPRESSION: 1. Similar appearance of very extensive inflammatory fat stranding and fluid about the pancreas and adjacent retroperitoneum, perhaps somewhat more organized on today's examination with some evidence of rim enhancement within the central small bowel mesentery. Findings are consistent with acute pancreatitis and suspicious for organizing acute pancreatic fluid collections. Consider close interval follow-up. No evidence of pancreatic parenchymal necrosis. 2. Mild extrahepatic biliary ductal dilatation, the common hepatic duct measuring up to 0.9 cm in caliber. Duct tapers smoothly to the ampulla without calculus or other obstruction. Incidental note of a very low insertion of the cystic duct within the pancreatic head. 3. Pleural effusions, trace ascites, and anasarca. Electronically Signed   By: Marolyn JONETTA Jaksch M.D.   On: 10/04/2023 22:09   MR 3D Recon At Scanner Result Date: 10/04/2023 CLINICAL DATA:  Pancreatitis, rule out common bile duct stone, dilated common bile duct by CT EXAM: MRI ABDOMEN WITHOUT AND WITH CONTRAST (INCLUDING MRCP) TECHNIQUE: Multiplanar multisequence MR imaging of the abdomen was performed both before and after the administration of intravenous contrast. Heavily T2-weighted images of the biliary and pancreatic ducts were obtained, and three-dimensional MRCP images were rendered by post processing. CONTRAST:  7.5mL GADAVIST  GADOBUTROL  1 MMOL/ML IV SOLN COMPARISON:  CT abdomen pelvis, 10/02/2023 FINDINGS: Lower chest: Small bilateral pleural effusions and associated atelectasis or consolidation. Hepatobiliary: No solid liver abnormality is seen. No gallstones or gallbladder wall thickening. Mild extrahepatic biliary ductal dilatation, the common hepatic duct measuring up to 0.9 cm in caliber. Incidental  note of a very low insertion of the cystic duct within the pancreatic head (series 16, image 17). Duct tapers smoothly to the ampulla without calculus or other obstruction. Pancreas: Similar appearance of very extensive inflammatory fat stranding and fluid about the pancreas and adjacent retroperitoneum, perhaps somewhat more organized on today's examination with some evidence of rim enhancement within the central small bowel mesentery (series 1402, image 69). 0.9 cm fluid signal cystic lesion within the pancreatic neck (series 16, image 16). Spleen: Normal in size without significant abnormality. Adrenals/Urinary Tract: Adrenal glands are unremarkable. Kidneys are normal, without renal calculi, solid lesion, or hydronephrosis. Stomach/Bowel: Stomach is within normal limits. No evidence of bowel wall thickening, distention, or inflammatory changes. Vascular/Lymphatic: No significant vascular findings are present. No enlarged abdominal lymph nodes. Other: Anasarca.  Trace perihepatic ascites. Musculoskeletal: No acute or significant osseous findings. IMPRESSION: 1. Similar appearance of very extensive inflammatory fat stranding and fluid about the pancreas and adjacent retroperitoneum, perhaps somewhat more organized on today's examination with some evidence of rim enhancement within the central small bowel mesentery. Findings are consistent with acute pancreatitis and suspicious for organizing acute pancreatic fluid collections. Consider close interval follow-up. No evidence of pancreatic parenchymal necrosis. 2. Mild extrahepatic biliary ductal dilatation, the common hepatic duct measuring up to 0.9 cm in caliber. Duct tapers smoothly to the ampulla without calculus or other obstruction. Incidental note of a very low insertion of the cystic duct within the pancreatic head. 3. Pleural effusions, trace ascites, and anasarca. Electronically Signed   By: Marolyn JONETTA Jaksch M.D.   On: 10/04/2023 22:09    Endoscopic  Studies: None   Clinical Impression   73 year old female admitted with acute pancreatitis  onset 09/27/2023.  Initially evaluated in the ER on 09/28/2023 with diagnosis of acute pancreatitis and discharged but had ongoing symptoms with progress in abdominal pain and nausea.  She was admitted for further evaluation with initial imaging showing stones in the gallbladder neck and dilated CBD with enhancement of the ampulla with a punctate stone in the first portion of the duodenum.  She initially had mild elevation of her liver function tests which have normalized.  Lipase also normalized.    MRCP 10/04/2023 did not show any evidence of retained stone -only mild extrahepatic biliary ductal dilation with common hepatic duct measuring 0.9 cm.  There was very extensive inflammatory fat stranding around the pancreas and adjacent retroperitoneum concerning for organizing acute pancreatic fluid collections.  Patient has been clinically stable over the last 24 hours.  Afebrile with Tmax 99.4.  She does have persistent leukocytosis that has been attributed to pancreatitis but no overt signs or symptoms of obstruction.  Will require close monitoring in the setting of evolving fluid collections to ensure no evidence of infection or necrosis.   Plan  Monitor temperature curve -if patient develops fever will need blood cultures, initiation of antibiotics and repeat CT imaging Continue monitoring daily LFTs and CBC Continue antiemetics and pain control as needed May advance to soft diet if patient feels that she can tolerate diet advancement. Anticipate she will require cholecystectomy in the future -has been seen by general surgery with plan for surgery once stable from pancreatitis standpoint.   LOS: 2 days   Inocente CHRISTELLA Hausen  10/05/2023, 2:48 PM  Inocente Hausen, MD [facility]

## 2023-10-06 DIAGNOSIS — K851 Biliary acute pancreatitis without necrosis or infection: Secondary | ICD-10-CM | POA: Diagnosis not present

## 2023-10-06 LAB — BASIC METABOLIC PANEL WITH GFR
Anion gap: 9 (ref 5–15)
BUN: <5 mg/dL — ABNORMAL LOW (ref 8–23)
CO2: 25 mmol/L (ref 22–32)
Calcium: 8.4 mg/dL — ABNORMAL LOW (ref 8.9–10.3)
Chloride: 98 mmol/L (ref 98–111)
Creatinine, Ser: 0.39 mg/dL — ABNORMAL LOW (ref 0.44–1.00)
GFR, Estimated: >60 mL/min (ref >60)
Glucose, Bld: 100 mg/dL — ABNORMAL HIGH (ref 70–99)
Potassium: 3 mmol/L — ABNORMAL LOW (ref 3.5–5.1)
Sodium: 132 mmol/L — ABNORMAL LOW (ref 135–145)

## 2023-10-06 LAB — CBC WITH DIFFERENTIAL/PLATELET
Abs Immature Granulocytes: 0.59 K/uL — ABNORMAL HIGH (ref 0.00–0.07)
Basophils Absolute: 0.1 K/uL (ref 0.0–0.1)
Basophils Relative: 1 %
Eosinophils Absolute: 0.3 K/uL (ref 0.0–0.5)
Eosinophils Relative: 2 %
HCT: 35 % — ABNORMAL LOW (ref 36.0–46.0)
Hemoglobin: 12.2 g/dL (ref 12.0–15.0)
Immature Granulocytes: 3 %
Lymphocytes Relative: 8 %
Lymphs Abs: 1.6 K/uL (ref 0.7–4.0)
MCH: 31.7 pg (ref 26.0–34.0)
MCHC: 34.9 g/dL (ref 30.0–36.0)
MCV: 90.9 fL (ref 80.0–100.0)
Monocytes Absolute: 1.2 K/uL — ABNORMAL HIGH (ref 0.1–1.0)
Monocytes Relative: 6 %
Neutro Abs: 15.6 K/uL — ABNORMAL HIGH (ref 1.7–7.7)
Neutrophils Relative %: 80 %
Platelets: 393 K/uL (ref 150–400)
RBC: 3.85 MIL/uL — ABNORMAL LOW (ref 3.87–5.11)
RDW: 14 % (ref 11.5–15.5)
WBC: 19.4 K/uL — ABNORMAL HIGH (ref 4.0–10.5)
nRBC: 0 % (ref 0.0–0.2)

## 2023-10-06 MED ORDER — POTASSIUM CHLORIDE 10 MEQ/100ML IV SOLN
10.0000 meq | INTRAVENOUS | Status: AC
  Administered 2023-10-06: 10 meq via INTRAVENOUS
  Filled 2023-10-06: qty 100

## 2023-10-06 MED ORDER — POTASSIUM CHLORIDE 10 MEQ/100ML IV SOLN
10.0000 meq | INTRAVENOUS | Status: AC
  Administered 2023-10-06 (×2): 10 meq via INTRAVENOUS
  Filled 2023-10-06 (×2): qty 100

## 2023-10-06 MED ORDER — PANTOPRAZOLE SODIUM 40 MG IV SOLR
40.0000 mg | Freq: Two times a day (BID) | INTRAVENOUS | Status: DC
  Administered 2023-10-06 – 2023-10-07 (×4): 40 mg via INTRAVENOUS
  Filled 2023-10-06 (×5): qty 10

## 2023-10-06 MED ORDER — POTASSIUM CHLORIDE 10 MEQ/100ML IV SOLN
INTRAVENOUS | Status: AC
  Administered 2023-10-06: 10 meq via INTRAVENOUS
  Filled 2023-10-06: qty 100

## 2023-10-06 NOTE — Progress Notes (Addendum)
 Inpatient Progress Note     Patient Profile/Chief Complaint  73 year old female admitted with acute pancreatitis and biliary ductal dilation.  Pancreatitis potentially secondary to gallstone pancreatitis.  MRCP imaging 10/04/2023 does not show retained stone but extensive peripancreatic inflammation and organizing fluid collections   Interval History   -- Abdominal pain continues to improve; no nausea or vomiting -- Leukocytosis slightly improved WBC 19.4-Tmax 99.4 -- Reports some constipation and passed loose stool when she went to the bathroom -- Endorses worsening reflux symptoms    Objective   Vital signs in last 24 hours: Temp:  [98 F (36.7 C)-98.6 F (37 C)] 98.1 F (36.7 C) (07/19 0745) Pulse Rate:  [81-86] 85 (07/19 0745) Resp:  [18] 18 (07/19 0447) BP: (107-119)/(47-74) 119/57 (07/19 0745) SpO2:  [92 %-97 %] 94 % (07/19 0745) Last BM Date : 10/05/23 General:    Alert, resting in bed no distress Heart:  Regular rate and rhythm; no murmurs Lungs: Respirations even and unlabored, lungs CTA bilaterally Abdomen:  Soft, minimal tenderness to palpation in the epigastrium and nondistended. Normal bowel sounds. Extremities:  Without edema. Neurologic:  Alert and oriented,  grossly normal neurologically. Psych:  Cooperative. Normal mood and affect.  Intake/Output from previous day: 07/18 0701 - 07/19 0700 In: 3 [I.V.:3] Out: -  Intake/Output this shift: No intake/output data recorded.  Lab Results: Recent Labs    10/04/23 0317 10/05/23 0204 10/06/23 0215  WBC 22.8* 22.9* 19.4*  HGB 13.1 12.4 12.2  HCT 37.3 35.5* 35.0*  PLT 408* 395 393   BMET Recent Labs    10/04/23 0317 10/05/23 0204 10/06/23 0215  NA 130* 130* 132*  K 3.4* 3.0* 3.0*  CL 96* 95* 98  CO2 24 25 25   GLUCOSE 102* 91 100*  BUN 7* 5* <5*  CREATININE 0.63 0.59 0.39*  CALCIUM  8.3* 8.3* 8.4*   LFT Recent Labs    10/05/23 0204  PROT 4.9*  ALBUMIN 1.8*  AST 47*  ALT 36  ALKPHOS 96   BILITOT 0.7   PT/INR Recent Labs    10/03/23 1322  LABPROT 17.7*  INR 1.4*    Studies/Results: MR ABDOMEN MRCP W WO CONTAST Result Date: 10/04/2023 CLINICAL DATA:  Pancreatitis, rule out common bile duct stone, dilated common bile duct by CT EXAM: MRI ABDOMEN WITHOUT AND WITH CONTRAST (INCLUDING MRCP) TECHNIQUE: Multiplanar multisequence MR imaging of the abdomen was performed both before and after the administration of intravenous contrast. Heavily T2-weighted images of the biliary and pancreatic ducts were obtained, and three-dimensional MRCP images were rendered by post processing. CONTRAST:  7.5mL GADAVIST  GADOBUTROL  1 MMOL/ML IV SOLN COMPARISON:  CT abdomen pelvis, 10/02/2023 FINDINGS: Lower chest: Small bilateral pleural effusions and associated atelectasis or consolidation. Hepatobiliary: No solid liver abnormality is seen. No gallstones or gallbladder wall thickening. Mild extrahepatic biliary ductal dilatation, the common hepatic duct measuring up to 0.9 cm in caliber. Incidental note of a very low insertion of the cystic duct within the pancreatic head (series 16, image 17). Duct tapers smoothly to the ampulla without calculus or other obstruction. Pancreas: Similar appearance of very extensive inflammatory fat stranding and fluid about the pancreas and adjacent retroperitoneum, perhaps somewhat more organized on today's examination with some evidence of rim enhancement within the central small bowel mesentery (series 1402, image 69). 0.9 cm fluid signal cystic lesion within the pancreatic neck (series 16, image 16). Spleen: Normal in size without significant abnormality. Adrenals/Urinary Tract: Adrenal glands are unremarkable. Kidneys are normal, without renal  calculi, solid lesion, or hydronephrosis. Stomach/Bowel: Stomach is within normal limits. No evidence of bowel wall thickening, distention, or inflammatory changes. Vascular/Lymphatic: No significant vascular findings are present. No  enlarged abdominal lymph nodes. Other: Anasarca.  Trace perihepatic ascites. Musculoskeletal: No acute or significant osseous findings. IMPRESSION: 1. Similar appearance of very extensive inflammatory fat stranding and fluid about the pancreas and adjacent retroperitoneum, perhaps somewhat more organized on today's examination with some evidence of rim enhancement within the central small bowel mesentery. Findings are consistent with acute pancreatitis and suspicious for organizing acute pancreatic fluid collections. Consider close interval follow-up. No evidence of pancreatic parenchymal necrosis. 2. Mild extrahepatic biliary ductal dilatation, the common hepatic duct measuring up to 0.9 cm in caliber. Duct tapers smoothly to the ampulla without calculus or other obstruction. Incidental note of a very low insertion of the cystic duct within the pancreatic head. 3. Pleural effusions, trace ascites, and anasarca. Electronically Signed   By: Marolyn JONETTA Jaksch M.D.   On: 10/04/2023 22:09   MR 3D Recon At Scanner Result Date: 10/04/2023 CLINICAL DATA:  Pancreatitis, rule out common bile duct stone, dilated common bile duct by CT EXAM: MRI ABDOMEN WITHOUT AND WITH CONTRAST (INCLUDING MRCP) TECHNIQUE: Multiplanar multisequence MR imaging of the abdomen was performed both before and after the administration of intravenous contrast. Heavily T2-weighted images of the biliary and pancreatic ducts were obtained, and three-dimensional MRCP images were rendered by post processing. CONTRAST:  7.5mL GADAVIST  GADOBUTROL  1 MMOL/ML IV SOLN COMPARISON:  CT abdomen pelvis, 10/02/2023 FINDINGS: Lower chest: Small bilateral pleural effusions and associated atelectasis or consolidation. Hepatobiliary: No solid liver abnormality is seen. No gallstones or gallbladder wall thickening. Mild extrahepatic biliary ductal dilatation, the common hepatic duct measuring up to 0.9 cm in caliber. Incidental note of a very low insertion of the cystic  duct within the pancreatic head (series 16, image 17). Duct tapers smoothly to the ampulla without calculus or other obstruction. Pancreas: Similar appearance of very extensive inflammatory fat stranding and fluid about the pancreas and adjacent retroperitoneum, perhaps somewhat more organized on today's examination with some evidence of rim enhancement within the central small bowel mesentery (series 1402, image 69). 0.9 cm fluid signal cystic lesion within the pancreatic neck (series 16, image 16). Spleen: Normal in size without significant abnormality. Adrenals/Urinary Tract: Adrenal glands are unremarkable. Kidneys are normal, without renal calculi, solid lesion, or hydronephrosis. Stomach/Bowel: Stomach is within normal limits. No evidence of bowel wall thickening, distention, or inflammatory changes. Vascular/Lymphatic: No significant vascular findings are present. No enlarged abdominal lymph nodes. Other: Anasarca.  Trace perihepatic ascites. Musculoskeletal: No acute or significant osseous findings. IMPRESSION: 1. Similar appearance of very extensive inflammatory fat stranding and fluid about the pancreas and adjacent retroperitoneum, perhaps somewhat more organized on today's examination with some evidence of rim enhancement within the central small bowel mesentery. Findings are consistent with acute pancreatitis and suspicious for organizing acute pancreatic fluid collections. Consider close interval follow-up. No evidence of pancreatic parenchymal necrosis. 2. Mild extrahepatic biliary ductal dilatation, the common hepatic duct measuring up to 0.9 cm in caliber. Duct tapers smoothly to the ampulla without calculus or other obstruction. Incidental note of a very low insertion of the cystic duct within the pancreatic head. 3. Pleural effusions, trace ascites, and anasarca. Electronically Signed   By: Marolyn JONETTA Jaksch M.D.   On: 10/04/2023 22:09    Endoscopic Studies: None   Clinical Impression    73 year old female admitted with acute pancreatitis onset 09/27/2023.  Initially evaluated  in the ER on 09/28/2023 with diagnosis of acute pancreatitis and discharged but had ongoing symptoms with progress in abdominal pain and nausea.  She was admitted for further evaluation with initial imaging showing stones in the gallbladder neck and dilated CBD with enhancement of the ampulla with a punctate stone in the first portion of the duodenum.  She initially had mild elevation of her liver function tests which have normalized.  Lipase also normalized.    MRCP 10/04/2023 did not show any evidence of retained stone -only mild extrahepatic biliary ductal dilation with common hepatic duct measuring 0.9 cm.  There was very extensive inflammatory fat stranding around the pancreas and adjacent retroperitoneum concerning for organizing acute pancreatic fluid collections.  Ms. Tokar is overall clinically stable.  Afebrile with Tmax 99.4.  She does have persistent leukocytosis that has been attributed to pancreatitis but no overt signs or symptoms of infection.  Will require close monitoring in the setting of evolving fluid collections to ensure no evidence of infection or necrosis.  Reports some worsening reflux symptoms today.  Given her general well appearance we will advance her diet and continue monitoring.   Plan  Monitor temperature curve -if patient develops fever will need blood cultures, initiation of antibiotics and repeat CT imaging Continue monitoring daily CBC Pantoprazole  40 mg p.o. twice daily. Laxatives and stool softeners as needed. Continue antiemetics and pain control as needed Advance to full liquid diet. Anticipate she will require cholecystectomy in the future -has been seen by general surgery with plan for surgery once stable from pancreatitis standpoint.   LOS: 3 days   Inocente CHRISTELLA Hausen  10/06/2023, 10:52 AM  Inocente Hausen, MD [facility]

## 2023-10-06 NOTE — Progress Notes (Signed)
   10/06/23 0936  Mobility  Activity Ambulated with assistance in hallway  Level of Assistance Contact guard assist, steadying assist (CG d/t unstediness and LOB x1)  Assistive Device None;Other (Comment) (HHA)  Distance Ambulated (ft) 400 ft  Activity Response Tolerated fair  Mobility Referral Yes  Mobility visit 1 Mobility  Mobility Specialist Start Time (ACUTE ONLY) [id number]  Mobility Specialist Stop Time (ACUTE ONLY) 0945  Mobility Specialist Time Calculation (min) (ACUTE ONLY) 9 min   Mobility Specialist: Progress Note  Pt agreeable to mobility session - received standing in BR. C/o unsteadiness and E leg soreness. Returned to EOB with all needs met - call bell within reach.   Virgle Boards, BS Mobility Specialist Please contact via SecureChat or  Rehab office at [phone].

## 2023-10-06 NOTE — Plan of Care (Signed)

## 2023-10-06 NOTE — Progress Notes (Addendum)
 PROGRESS NOTE    Abigail Robinson  FMW:[id number]  DOB: 09/18/1950  DOA: 10/02/2023 PCP: Stephane Leita DEL, MD Outpatient Specialists:   Hospital course:  73 y.o. female with medical history significant of hypertension, hyperlipidemia, GERD, CKD 2, hypothyroidism, BPPV, neuropathy, hysterectomy, DDD, presented with ongoing abdominal pain, diagnosed with acute interstitial edematous pancreatitis likely secondary to gallstones .  Patient has been managed conservatively per recommendations of GI and general surgery.  General surgery plans for outpatient laparoscopic cholecystectomy once her pancreatitis has down.  Subjective:  Patient thinks she is doing better and better.  Notes that she has been tolerating the clear liquid diet but does not like the fact that there is so many high sugar drinks in it.  Would like to try full liquid diet if possible.  Patient also would like sutures that were placed 1 week ago on her scalp to be removed.  Patient also complains of increased symptoms of GERD.   Objective: Vitals:   10/05/23 2359 10/06/23 0447 10/06/23 0745 10/06/23 1255  BP: 115/68 111/62 (!) 119/57 122/68  Pulse: 86 81 85 82  Resp: 18 18    Temp: 98.4 F (36.9 C) 98.6 F (37 C) 98.1 F (36.7 C) 98.2 F (36.8 C)  TempSrc: Oral Oral Oral Oral  SpO2: 93% 92% 94% 96%  Weight:      Height:       No intake or output data in the 24 hours ending 10/06/23 1536 Filed Weights   10/02/23 1800  Weight: 78 kg     Exam:  General: Patient sitting up in bed looking pale but comfortable drinking her clear liquid breakfast.  Sutures on scalp without erythema. Eyes: sclera anicteric, conjuctiva mild injection bilaterally CVS: S1-S2, regular  Respiratory: CTA GI: NABS, soft, NT to light palpation in epigastric area, no voluntary guarding or rebound tenderness LE: Warm and well-perfused Neuro: A/O x 3,  grossly nonfocal.  Psych: patient is logical and coherent, judgement and insight  appear normal, mood and affect appropriate to situation.  Data Reviewed:  Basic Metabolic Panel: Recent Labs  Lab 10/02/23 1440 10/02/23 2122 10/03/23 1322 10/04/23 0317 10/05/23 0204 10/06/23 0215  NA 127*  --  131* 130* 130* 132*  K 3.1*  --  3.4* 3.4* 3.0* 3.0*  CL 90*  --  96* 96* 95* 98  CO2 30  --  24 24 25 25   GLUCOSE 97  --  92 102* 91 100*  BUN 15  --  9 7* 5* <5*  CREATININE 0.73  --  0.63 0.63 0.59 0.39*  CALCIUM  8.7  --  8.4* 8.3* 8.3* 8.4*  MG  --  1.9  --   --   --   --     CBC: Recent Labs  Lab 10/02/23 1440 10/03/23 1322 10/04/23 0317 10/05/23 0204 10/06/23 0215  WBC 15.9* 21.3* 22.8* 22.9* 19.4*  NEUTROABS 13.2*  --   --  19.5* 15.6*  HGB 14.1 13.8 13.1 12.4 12.2  HCT 42.5 39.6 37.3 35.5* 35.0*  MCV 93.5 90.4 89.4 89.9 90.9  PLT 421.0* 415* 408* 395 393     Scheduled Meds:  atenolol   25 mg Oral Daily   calcium  carbonate  1 tablet Oral BID WC   DULoxetine   40 mg Oral Daily   enoxaparin  (LOVENOX ) injection  40 mg Subcutaneous Q24H   gabapentin   100 mg Oral QHS   levothyroxine   75 mcg Oral QHS   pantoprazole  (PROTONIX ) IV  40 mg Intravenous  Q12H   sodium chloride  flush  3 mL Intravenous Q12H   Continuous Infusions:  potassium chloride        Assessment & Plan:   Gallstone pancreatitis, acute interstitial Patient is improving clinically  We will advance diet to full liquids to see how she tolerates CT abdomen pelvis showed evidence of gallstones in the neck of the gallbladder and mild diffuse extrahepatic biliary ductal dilatation with duct measuring up to 9 mm as well as a small hypodensity in the pancreatic neck measuring 5 mm.  There is associated peripancreatic inflammation consistent with pancreatitis. MRCP done on 7/17 showed Mild extrahepatic biliary ductal dilatation, no pancreatic necrosis and severe pancreatic inflammation. Per general surgery, plan is for outpatient cholecystectomy after pancreatitis as cooled down  GERD Will  change oral pantoprazole  to IV given ongoing symptomatology  Hypokalemia Will replete and recheck  Scalp sutures RN to remove scalp sutures, order placed  HTN Well-controlled on atenolol   Neuropathy Continue gabapentin     DVT prophylaxis: Lovenox  Code Status: Full Family Communication: None today     Studies: MR ABDOMEN MRCP W WO CONTAST Result Date: 10/04/2023 CLINICAL DATA:  Pancreatitis, rule out common bile duct stone, dilated common bile duct by CT EXAM: MRI ABDOMEN WITHOUT AND WITH CONTRAST (INCLUDING MRCP) TECHNIQUE: Multiplanar multisequence MR imaging of the abdomen was performed both before and after the administration of intravenous contrast. Heavily T2-weighted images of the biliary and pancreatic ducts were obtained, and three-dimensional MRCP images were rendered by post processing. CONTRAST:  7.5mL GADAVIST  GADOBUTROL  1 MMOL/ML IV SOLN COMPARISON:  CT abdomen pelvis, 10/02/2023 FINDINGS: Lower chest: Small bilateral pleural effusions and associated atelectasis or consolidation. Hepatobiliary: No solid liver abnormality is seen. No gallstones or gallbladder wall thickening. Mild extrahepatic biliary ductal dilatation, the common hepatic duct measuring up to 0.9 cm in caliber. Incidental note of a very low insertion of the cystic duct within the pancreatic head (series 16, image 17). Duct tapers smoothly to the ampulla without calculus or other obstruction. Pancreas: Similar appearance of very extensive inflammatory fat stranding and fluid about the pancreas and adjacent retroperitoneum, perhaps somewhat more organized on today's examination with some evidence of rim enhancement within the central small bowel mesentery (series 1402, image 69). 0.9 cm fluid signal cystic lesion within the pancreatic neck (series 16, image 16). Spleen: Normal in size without significant abnormality. Adrenals/Urinary Tract: Adrenal glands are unremarkable. Kidneys are normal, without renal calculi,  solid lesion, or hydronephrosis. Stomach/Bowel: Stomach is within normal limits. No evidence of bowel wall thickening, distention, or inflammatory changes. Vascular/Lymphatic: No significant vascular findings are present. No enlarged abdominal lymph nodes. Other: Anasarca.  Trace perihepatic ascites. Musculoskeletal: No acute or significant osseous findings. IMPRESSION: 1. Similar appearance of very extensive inflammatory fat stranding and fluid about the pancreas and adjacent retroperitoneum, perhaps somewhat more organized on today's examination with some evidence of rim enhancement within the central small bowel mesentery. Findings are consistent with acute pancreatitis and suspicious for organizing acute pancreatic fluid collections. Consider close interval follow-up. No evidence of pancreatic parenchymal necrosis. 2. Mild extrahepatic biliary ductal dilatation, the common hepatic duct measuring up to 0.9 cm in caliber. Duct tapers smoothly to the ampulla without calculus or other obstruction. Incidental note of a very low insertion of the cystic duct within the pancreatic head. 3. Pleural effusions, trace ascites, and anasarca. Electronically Signed   By: Marolyn JONETTA Jaksch M.D.   On: 10/04/2023 22:09   MR 3D Recon At Scanner Result Date: 10/04/2023 CLINICAL DATA:  Pancreatitis, rule out common bile duct stone, dilated common bile duct by CT EXAM: MRI ABDOMEN WITHOUT AND WITH CONTRAST (INCLUDING MRCP) TECHNIQUE: Multiplanar multisequence MR imaging of the abdomen was performed both before and after the administration of intravenous contrast. Heavily T2-weighted images of the biliary and pancreatic ducts were obtained, and three-dimensional MRCP images were rendered by post processing. CONTRAST:  7.5mL GADAVIST  GADOBUTROL  1 MMOL/ML IV SOLN COMPARISON:  CT abdomen pelvis, 10/02/2023 FINDINGS: Lower chest: Small bilateral pleural effusions and associated atelectasis or consolidation. Hepatobiliary: No solid liver  abnormality is seen. No gallstones or gallbladder wall thickening. Mild extrahepatic biliary ductal dilatation, the common hepatic duct measuring up to 0.9 cm in caliber. Incidental note of a very low insertion of the cystic duct within the pancreatic head (series 16, image 17). Duct tapers smoothly to the ampulla without calculus or other obstruction. Pancreas: Similar appearance of very extensive inflammatory fat stranding and fluid about the pancreas and adjacent retroperitoneum, perhaps somewhat more organized on today's examination with some evidence of rim enhancement within the central small bowel mesentery (series 1402, image 69). 0.9 cm fluid signal cystic lesion within the pancreatic neck (series 16, image 16). Spleen: Normal in size without significant abnormality. Adrenals/Urinary Tract: Adrenal glands are unremarkable. Kidneys are normal, without renal calculi, solid lesion, or hydronephrosis. Stomach/Bowel: Stomach is within normal limits. No evidence of bowel wall thickening, distention, or inflammatory changes. Vascular/Lymphatic: No significant vascular findings are present. No enlarged abdominal lymph nodes. Other: Anasarca.  Trace perihepatic ascites. Musculoskeletal: No acute or significant osseous findings. IMPRESSION: 1. Similar appearance of very extensive inflammatory fat stranding and fluid about the pancreas and adjacent retroperitoneum, perhaps somewhat more organized on today's examination with some evidence of rim enhancement within the central small bowel mesentery. Findings are consistent with acute pancreatitis and suspicious for organizing acute pancreatic fluid collections. Consider close interval follow-up. No evidence of pancreatic parenchymal necrosis. 2. Mild extrahepatic biliary ductal dilatation, the common hepatic duct measuring up to 0.9 cm in caliber. Duct tapers smoothly to the ampulla without calculus or other obstruction. Incidental note of a very low insertion of the  cystic duct within the pancreatic head. 3. Pleural effusions, trace ascites, and anasarca. Electronically Signed   By: Marolyn JONETTA Jaksch M.D.   On: 10/04/2023 22:09    Principal Problem:   Acute pancreatitis Active Problems:   Gastro-esophageal reflux disease without esophagitis   Hyperlipidemia   Stage 2 chronic kidney disease   Hypertensive chronic kidney disease with stage 1 through stage 4 chronic kidney disease, or unspecified chronic kidney disease   Hypothyroidism   Peripheral neuropathy   Hyponatremia   Hypokalemia     Becky Berberian Vangie Pike, [facility]  If 7PM-7AM, please contact night-coverage [email or web address]   LOS: 3 days

## 2023-10-07 DIAGNOSIS — K851 Biliary acute pancreatitis without necrosis or infection: Secondary | ICD-10-CM | POA: Diagnosis not present

## 2023-10-07 LAB — BASIC METABOLIC PANEL WITH GFR
Anion gap: 11 (ref 5–15)
BUN: 5 mg/dL — ABNORMAL LOW (ref 8–23)
CO2: 24 mmol/L (ref 22–32)
Calcium: 8.6 mg/dL — ABNORMAL LOW (ref 8.9–10.3)
Chloride: 100 mmol/L (ref 98–111)
Creatinine, Ser: 0.47 mg/dL (ref 0.44–1.00)
GFR, Estimated: >60 mL/min (ref >60)
Glucose, Bld: 90 mg/dL (ref 70–99)
Potassium: 3.8 mmol/L (ref 3.5–5.1)
Sodium: 135 mmol/L (ref 135–145)

## 2023-10-07 LAB — LIPASE, BLOOD: Lipase: 172 U/L — ABNORMAL HIGH (ref 11–51)

## 2023-10-07 MED ORDER — TIZANIDINE HCL 4 MG PO TABS
2.0000 mg | ORAL_TABLET | Freq: Three times a day (TID) | ORAL | Status: DC | PRN
  Administered 2023-10-08: 2 mg via ORAL
  Filled 2023-10-07: qty 1

## 2023-10-07 NOTE — Plan of Care (Signed)

## 2023-10-07 NOTE — Progress Notes (Signed)
  Progress Note   Patient: Abigail Robinson FMW:[id number] DOB: 12/24/1950 DOA: 10/02/2023     4 DOS: the patient was seen and examined on 10/07/2023   Brief hospital course:   73 y.o. female with medical history significant of hypertension, hyperlipidemia, GERD, CKD 2, hypothyroidism, BPPV, neuropathy, hysterectomy, DDD, presented with ongoing abdominal pain, diagnosed with acute interstitial edematous pancreatitis likely secondary to gallstones and both GI and surgical team on board with plan for cholecystectomy later.  Assessment and Plan:  Acute interstitial pancreatitis, POA: Likely gallstone pancreatitis. Initial lipase level was greater than 2800.  Most recent lipase level of 64.  Patient's Pain is improving CT abdomen pelvis showed evidence of gallstones in the neck of the gallbladder and mild diffuse extrahepatic biliary ductal dilatation with duct measuring up to 9 mm as well as a small hypodensity in the pancreatic neck measuring 5 mm.  There is associated peripancreatic inflammation consistent with pancreatitis. Diet advanced to a soft diet on 7/20. Continue with as needed analgesics GI on board.   MRCP done on 7/17 showed Mild extrahepatic biliary ductal dilatation, no pancreatic necrosis and severe pancreatic inflammation. General Surgical team on board.  Patient will need cholecystectomy as an outpatient.  Hyponatremia, POA: Likely hypovolemic hyponatremia.  Off of IVF now.  Hypokalemia, POA: As needed repletion  Essential hypertension, POA: As needed intravenous hydralazine for systolic blood pressure greater than 160 Continue with atenolol   GERD, POA: Continue with PPI  Hypothyroidism: Continue with Synthroid   Neuropathy: Continue with gabapentin    DVT prophylaxis: Lovenox   Disposition: Home.  Patient lives at home by herself. Needs HHPT.     Subjective: She is on a full liquid diet and wants it it to be advanced to a soft diet. Pain is better. She did ambulate  some yesterday. She wants home health PT on discharge.  Physical Exam: Vitals:   10/06/23 2331 10/07/23 0402 10/07/23 0735 10/07/23 0818  BP: 125/73 136/79 131/61 131/61  Pulse: 85 87 90 90  Resp: 18     Temp: 98.1 F (36.7 C) 99 F (37.2 C) 98.1 F (36.7 C)   TempSrc:  Oral    SpO2: 94% 91% 93%   Weight:      Height:       Constitutional: Alert and awake, in no distress Eyes: PERRL, lids and conjunctivae normal ENMT: Mucous membranes are moist. Posterior pharynx clear of any exudate or lesions.Normal dentition.  Neck: normal, supple, no masses, no thyromegaly Respiratory: clear to auscultation bilaterally, no wheezing, no crackles. Normal respiratory effort. No accessory muscle use.  Cardiovascular: Regular rate and rhythm, no murmurs / rubs / gallops. No extremity edema. 2+ pedal pulses. No carotid bruits.  Abdomen: Slight tenderness to palpation in the epigastric region, no masses palpated. No hepatosplenomegaly. Bowel sounds positive.  Musculoskeletal: no clubbing / cyanosis. No joint deformity upper and lower extremities. Good ROM, no contractures. Normal muscle tone.  Skin: no rashes, lesions, ulcers. No induration Neurologic: CN 2-12 grossly intact. Sensation intact, DTR normal. Strength 5/5 x all 4 extremities.  Psychiatric: Normal judgment and insight. Alert and oriented x 3. Normal mood.   Data Reviewed:  There are no new results to review at this time.  Family Communication: None at the bedside  Disposition: Status is: Inpatient Remains inpatient appropriate because: Acute pancreatitis  Planned Discharge Destination: Home    Time spent: 40 minutes  Author: Deliliah Room, MD 10/07/2023 8:56 AM  For on call review [email or web address].

## 2023-10-07 NOTE — Progress Notes (Signed)
 Mobility Specialist: Progress Note   10/07/23 1036  Mobility  Activity Ambulated with assistance in hallway  Level of Assistance Standby assist, set-up cues, supervision of patient - no hands on  Assistive Device Front wheel walker  Distance Ambulated (ft) 500 ft  Activity Response Tolerated well  Mobility Referral Yes  Mobility visit 1 Mobility  Mobility Specialist Start Time (ACUTE ONLY) [id number]  Mobility Specialist Stop Time (ACUTE ONLY) 0915  Mobility Specialist Time Calculation (min) (ACUTE ONLY) 24 min    Pt received in bed, agreeable to mobility session. Feeling a little unsteady this morning and opted to use RW. SV throughout. No other complaints. Returned to room without fault. Left ambulating to BR with all needs met, call bell in reach.   Ileana Lute Mobility Specialist Please contact via SecureChat or Rehab office at [phone]

## 2023-10-07 NOTE — Plan of Care (Signed)

## 2023-10-07 NOTE — Progress Notes (Addendum)
 Inpatient Progress Note     Patient Profile/Chief Complaint  73 year old female admitted with acute pancreatitis and biliary ductal dilation.  Pancreatitis potentially secondary to gallstone pancreatitis.  MRCP imaging 10/04/2023 does not show retained stone but extensive peripancreatic inflammation and organizing fluid collections   Interval History   -- Noted increase in abdominal pain yesterday with diet advancement -- Mild nausea but no vomiting -- Continues to have some loose stool -- Lipase minimally elevated today at 172    Objective   Vital signs in last 24 hours: Temp:  [98.1 F (36.7 C)-99.5 F (37.5 C)] 98.1 F (36.7 C) (07/20 0735) Pulse Rate:  [82-90] 90 (07/20 0818) Resp:  [16-18] 18 (07/19 2331) BP: (122-136)/(61-79) 131/61 (07/20 0818) SpO2:  [91 %-96 %] 93 % (07/20 0735) Last BM Date : 10/06/23 General:    Alert, resting in bed no distress Heart:  Regular rate and rhythm; no murmurs Lungs: Respirations even and unlabored, lungs CTA bilaterally Abdomen:  Soft, minimal tenderness to palpation in the epigastrium and nondistended. Normal bowel sounds. Extremities:  Without edema. Neurologic:  Alert and oriented,  grossly normal neurologically. Psych:  Cooperative. Normal mood and affect.  Intake/Output from previous day: 07/19 0701 - 07/20 0700 In: 274.5 [P.O.:250; IV Piggyback:24.5] Out: -  Intake/Output this shift: No intake/output data recorded.  Lab Results: Recent Labs    10/05/23 0204 10/06/23 0215  WBC 22.9* 19.4*  HGB 12.4 12.2  HCT 35.5* 35.0*  PLT 395 393   BMET Recent Labs    10/05/23 0204 10/06/23 0215 10/07/23 0222  NA 130* 132* 135  K 3.0* 3.0* 3.8  CL 95* 98 100  CO2 25 25 24   GLUCOSE 91 100* 90  BUN 5* <5* 5*  CREATININE 0.59 0.39* 0.47  CALCIUM  8.3* 8.4* 8.6*   LFT Recent Labs    10/05/23 0204  PROT 4.9*  ALBUMIN 1.8*  AST 47*  ALT 36  ALKPHOS 96  BILITOT 0.7   PT/INR No results for input(s): LABPROT,  INR in the last 72 hours.   Studies/Results: No results found.   Endoscopic Studies: None   Clinical Impression   73 year old female admitted with acute pancreatitis onset 09/27/2023.  Initially evaluated in the ER on 09/28/2023 with diagnosis of acute pancreatitis and discharged but had ongoing symptoms with progress in abdominal pain and nausea.  She was admitted for further evaluation with initial imaging showing stones in the gallbladder neck and dilated CBD with enhancement of the ampulla with a punctate stone in the first portion of the duodenum.  She initially had mild elevation of her liver function tests which have normalized.  Lipase also normalized.    MRCP 10/04/2023 did not show any evidence of retained stone -only mild extrahepatic biliary ductal dilation with common hepatic duct measuring 0.9 cm.  There was very extensive inflammatory fat stranding around the pancreas and adjacent retroperitoneum concerning for organizing acute pancreatic fluid collections.  [name] is overall clinically stable.  In the setting of possible organizing pancreatic fluid collection she remains afebrile.  She has noted some increase in abdominal discomfort with advancement of her diet, however, her lipase minimally elevated from prior.    Plan  Monitor temperature curve -if patient develops fever will need blood cultures, initiation of antibiotics and repeat CT imaging Continue monitoring daily CBC Pantoprazole  40 mg IV twice daily in the setting of worsening reflux previously. Laxatives and stool softeners as needed. Continue antiemetics and pain control as needed Continue soft  diet Anticipate she will require cholecystectomy in the future -has been seen by general surgery with plan for surgery once stable from pancreatitis standpoint. She reports there has been discussion about potential discharge tomorrow.  If she is discharged from the hospital would recommend a follow-up CT coordinated  within 2 weeks of her prior imaging (last CT done 10/04/22) to reevaluate prior fluid collections as well as close follow-up with our office.  Dr. Nandigam and Vina Dasen, NP will assume rounding responsibilities 10/08/2023   LOS: 4 days   Abigail Robinson  10/07/2023, 11:22 AM  Abigail Hausen, MD [facility]

## 2023-10-08 DIAGNOSIS — K851 Biliary acute pancreatitis without necrosis or infection: Secondary | ICD-10-CM | POA: Diagnosis not present

## 2023-10-08 MED ORDER — OMEPRAZOLE 20 MG PO CPDR: 20.0000 mg | DELAYED_RELEASE_CAPSULE | Freq: Two times a day (BID) | ORAL | 0 refills | Status: AC

## 2023-10-08 NOTE — Discharge Summary (Signed)
 Physician Discharge Summary   Patient: Abigail Robinson MRN: [id number] DOB: 05/05/1950  Admit date:     10/02/2023  Discharge date: 10/08/23  Discharge Physician: Deliliah Room   PCP: Stephane Leita DEL, MD   Recommendations at discharge:    Follow up with your PCP in one week Follow up out patient with surgery and GI in 1-2 weeks. Call the offices to make an appointment. Continue taking meds as prescribed.  Discharge Diagnoses: Principal Problem:   Acute pancreatitis Active Problems:   Gastro-esophageal reflux disease without esophagitis   Hyperlipidemia   Stage 2 chronic kidney disease   Hypertensive chronic kidney disease with stage 1 through stage 4 chronic kidney disease, or unspecified chronic kidney disease   Hypothyroidism   Peripheral neuropathy   Hyponatremia   Hypokalemia    Hospital Course:  Acute interstitial pancreatitis, POA: Likely gallstone pancreatitis. Initial lipase level was greater than 2800.  Most recent lipase level of 64.  Patient's Pain significantly improved. CT abdomen pelvis showed evidence of gallstones in the neck of the gallbladder and mild diffuse extrahepatic biliary ductal dilatation with duct measuring up to 9 mm as well as a small hypodensity in the pancreatic neck measuring 5 mm.  There is associated peripancreatic inflammation consistent with pancreatitis. Diet advanced to a soft diet on 7/20. Tolerated well. Pain is well controlled with tylenol  on discharge. GI on board.   MRCP done on 7/17 showed Mild extrahepatic biliary ductal dilatation, no pancreatic necrosis and severe pancreatic inflammation. General Surgical team on board.  Patient will need lap cholecystectomy as an outpatient. Patient will call to make an appointment.   Hyponatremia, POA: Likely hypovolemic hyponatremia.  Off of IVF now.   Hypokalemia, ENJ:mzeozuzi   Essential hypertension, POA:  Continue with atenolol    GERD, POA: Continue with PPI   Hypothyroidism:  Continue with Synthroid    Neuropathy: Continue with gabapentin      Disposition: Home.  Patient lives at home by herself. HHPT arranged.      Consultants: GI,Surgery Procedures performed: None  Disposition: Home health Diet recommendation:  Cardiac diet DISCHARGE MEDICATION: Allergies as of 10/08/2023       Reactions   Demerol [meperidine] Nausea Only, Other (See Comments)   Reaction=headache   Gadolinium Dermatitis        Medication List     TAKE these medications    acetaminophen  500 MG tablet Commonly known as: TYLENOL  Take 1,000 mg by mouth 2 (two) times daily as needed for moderate pain or headache.   ascorbic acid 1000 MG tablet Commonly known as: VITAMIN C Take 1,000 mg by mouth daily.   atenolol  25 MG tablet Commonly known as: TENORMIN  Take 25 mg by mouth daily.   Calcium  Carb-Cholecalciferol 500-600 MG-UNIT Tabs Take 1 tablet by mouth daily.   cyanocobalamin  1000 MCG tablet Commonly known as: VITAMIN B12 Take 1,000 mcg by mouth daily.   DULoxetine  20 MG capsule Commonly known as: Cymbalta  Take 2 capsules (40 mg total) by mouth daily.   Fish Oil 1000 MG Caps Take 1,000 mg by mouth daily.   gabapentin  100 MG capsule Commonly known as: NEURONTIN  Take 2 capsules (200 mg total) by mouth at bedtime. What changed: how much to take   hydrochlorothiazide 12.5 MG capsule Commonly known as: MICROZIDE Take 12.5 mg by mouth as needed (fluid).   levothyroxine  75 MCG tablet Commonly known as: SYNTHROID  Take 75 mcg by mouth at bedtime.   omeprazole  20 MG capsule Commonly known as: PRILOSEC Take 1 capsule (  20 mg total) by mouth 2 (two) times daily.   ondansetron  4 MG disintegrating tablet Commonly known as: ZOFRAN -ODT Take 1 tablet (4 mg total) by mouth every 8 (eight) hours as needed for nausea or vomiting.   tiZANidine  2 MG tablet Commonly known as: ZANAFLEX  Take 1 tablet (2 mg total) by mouth at bedtime. What changed:  when to take  this reasons to take this   Vitamin A & D 5000-400 units Caps Take 1 capsule by mouth daily.        Follow-up Information     Stephane Leita DEL, MD. Call in 1 week(s).   Specialty: Internal Medicine Contact information: [address] [place] [address] [phone]         Sebastian Moles, MD. Call in 1 week(s).   Specialty: General Surgery Contact information: [address] [place] [address] [phone]         [facility]. Call in 1 week(s).   Specialty: Gastroenterology Contact information: [address] [place]  [address] [phone]               Discharge Exam: Fredricka Weights   10/02/23 1800  Weight: 78 kg   Constitutional: NAD, calm, comfortable Eyes: PERRL, lids and conjunctivae normal ENMT: Mucous membranes are moist. Posterior pharynx clear of any exudate or lesions.Normal dentition.  Neck: normal, supple, no masses, no thyromegaly Respiratory: clear to auscultation bilaterally, no wheezing, no crackles. Normal respiratory effort. No accessory muscle use.  Cardiovascular: Regular rate and rhythm, no murmurs / rubs / gallops. No extremity edema. 2+ pedal pulses. No carotid bruits.  Abdomen: no tenderness, no masses palpated. No hepatosplenomegaly. Bowel sounds positive.  Musculoskeletal: no clubbing / cyanosis. No joint deformity upper and lower extremities. Good ROM, no contractures. Normal muscle tone.  Skin: no rashes, lesions, ulcers. No induration Neurologic: CN 2-12 grossly intact. Sensation intact, DTR normal. Strength 5/5 x all 4 extremities.  Psychiatric: Normal judgment and insight. Alert and oriented x 3. Normal mood.    Condition at discharge: good  The results of significant diagnostics from this hospitalization (including imaging, microbiology, ancillary and laboratory) are listed below for reference.   Imaging Studies: MR ABDOMEN MRCP W WO CONTAST Result  Date: 10/04/2023 CLINICAL DATA:  Pancreatitis, rule out common bile duct stone, dilated common bile duct by CT EXAM: MRI ABDOMEN WITHOUT AND WITH CONTRAST (INCLUDING MRCP) TECHNIQUE: Multiplanar multisequence MR imaging of the abdomen was performed both before and after the administration of intravenous contrast. Heavily T2-weighted images of the biliary and pancreatic ducts were obtained, and three-dimensional MRCP images were rendered by post processing. CONTRAST:  7.5mL GADAVIST  GADOBUTROL  1 MMOL/ML IV SOLN COMPARISON:  CT abdomen pelvis, 10/02/2023 FINDINGS: Lower chest: Small bilateral pleural effusions and associated atelectasis or consolidation. Hepatobiliary: No solid liver abnormality is seen. No gallstones or gallbladder wall thickening. Mild extrahepatic biliary ductal dilatation, the common hepatic duct measuring up to 0.9 cm in caliber. Incidental note of a very low insertion of the cystic duct within the pancreatic head (series 16, image 17). Duct tapers smoothly to the ampulla without calculus or other obstruction. Pancreas: Similar appearance of very extensive inflammatory fat stranding and fluid about the pancreas and adjacent retroperitoneum, perhaps somewhat more organized on today's examination with some evidence of rim enhancement within the central small bowel mesentery (series 1402, image 69). 0.9 cm fluid signal cystic lesion within the pancreatic neck (series 16, image 16). Spleen: Normal in size without significant abnormality.  Adrenals/Urinary Tract: Adrenal glands are unremarkable. Kidneys are normal, without renal calculi, solid lesion, or hydronephrosis. Stomach/Bowel: Stomach is within normal limits. No evidence of bowel wall thickening, distention, or inflammatory changes. Vascular/Lymphatic: No significant vascular findings are present. No enlarged abdominal lymph nodes. Other: Anasarca.  Trace perihepatic ascites. Musculoskeletal: No acute or significant osseous findings.  IMPRESSION: 1. Similar appearance of very extensive inflammatory fat stranding and fluid about the pancreas and adjacent retroperitoneum, perhaps somewhat more organized on today's examination with some evidence of rim enhancement within the central small bowel mesentery. Findings are consistent with acute pancreatitis and suspicious for organizing acute pancreatic fluid collections. Consider close interval follow-up. No evidence of pancreatic parenchymal necrosis. 2. Mild extrahepatic biliary ductal dilatation, the common hepatic duct measuring up to 0.9 cm in caliber. Duct tapers smoothly to the ampulla without calculus or other obstruction. Incidental note of a very low insertion of the cystic duct within the pancreatic head. 3. Pleural effusions, trace ascites, and anasarca. Electronically Signed   By: Marolyn JONETTA Jaksch M.D.   On: 10/04/2023 22:09   MR 3D Recon At Scanner Result Date: 10/04/2023 CLINICAL DATA:  Pancreatitis, rule out common bile duct stone, dilated common bile duct by CT EXAM: MRI ABDOMEN WITHOUT AND WITH CONTRAST (INCLUDING MRCP) TECHNIQUE: Multiplanar multisequence MR imaging of the abdomen was performed both before and after the administration of intravenous contrast. Heavily T2-weighted images of the biliary and pancreatic ducts were obtained, and three-dimensional MRCP images were rendered by post processing. CONTRAST:  7.5mL GADAVIST  GADOBUTROL  1 MMOL/ML IV SOLN COMPARISON:  CT abdomen pelvis, 10/02/2023 FINDINGS: Lower chest: Small bilateral pleural effusions and associated atelectasis or consolidation. Hepatobiliary: No solid liver abnormality is seen. No gallstones or gallbladder wall thickening. Mild extrahepatic biliary ductal dilatation, the common hepatic duct measuring up to 0.9 cm in caliber. Incidental note of a very low insertion of the cystic duct within the pancreatic head (series 16, image 17). Duct tapers smoothly to the ampulla without calculus or other obstruction.  Pancreas: Similar appearance of very extensive inflammatory fat stranding and fluid about the pancreas and adjacent retroperitoneum, perhaps somewhat more organized on today's examination with some evidence of rim enhancement within the central small bowel mesentery (series 1402, image 69). 0.9 cm fluid signal cystic lesion within the pancreatic neck (series 16, image 16). Spleen: Normal in size without significant abnormality. Adrenals/Urinary Tract: Adrenal glands are unremarkable. Kidneys are normal, without renal calculi, solid lesion, or hydronephrosis. Stomach/Bowel: Stomach is within normal limits. No evidence of bowel wall thickening, distention, or inflammatory changes. Vascular/Lymphatic: No significant vascular findings are present. No enlarged abdominal lymph nodes. Other: Anasarca.  Trace perihepatic ascites. Musculoskeletal: No acute or significant osseous findings. IMPRESSION: 1. Similar appearance of very extensive inflammatory fat stranding and fluid about the pancreas and adjacent retroperitoneum, perhaps somewhat more organized on today's examination with some evidence of rim enhancement within the central small bowel mesentery. Findings are consistent with acute pancreatitis and suspicious for organizing acute pancreatic fluid collections. Consider close interval follow-up. No evidence of pancreatic parenchymal necrosis. 2. Mild extrahepatic biliary ductal dilatation, the common hepatic duct measuring up to 0.9 cm in caliber. Duct tapers smoothly to the ampulla without calculus or other obstruction. Incidental note of a very low insertion of the cystic duct within the pancreatic head. 3. Pleural effusions, trace ascites, and anasarca. Electronically Signed   By: Marolyn JONETTA Jaksch M.D.   On: 10/04/2023 22:09   CT ABDOMEN PELVIS W CONTRAST Result Date: 10/03/2023 EXAM: CT ABDOMEN  AND PELVIS WITH CONTRAST 10/02/2023 11:34:15 PM TECHNIQUE: CT of the abdomen and pelvis was performed with the  administration of intravenous contrast. Multiplanar reformatted images are provided for review. Automated exposure control, iterative reconstruction, and/or weight based adjustment of the mA/kV was utilized to reduce the radiation dose to as low as reasonably achievable. COMPARISON: CT abdomen and pelvis 09/28/2023. CLINICAL HISTORY: Pancreatitis suspected. 73 y/o female. Patient was seen here on 7/11 and diagnosed with pancreatitis. Told to f/u with GI. Doesn't have an appointment until 07/17 and pain has increased. Patient has had total hysterectomy. Patient has NOT had a cholecystectomy as prior MRI; reported per patient. FINDINGS: LOWER CHEST: No acute abnormality. LIVER: Hepatic steatosis. GALLBLADDER AND BILE DUCTS: Cholelithiasis without evidence of acute cholecystitis. The common bile duct is dilated measuring 10 mm in diameter. SPLEEN: No acute abnormality. PANCREAS: Marked edema and stranding about the pancreas compatible with pancreatitis. No organized fluid collection. No evidence of pancreatic necrosis. No pancreatic ductal dilation. The fluid about the pancreas tracks inferiorly in the anterior perirenal space and central mesentery. The overall extent of fluid and stranding surrounding the pancreas has progressed compared to 09/28/2023. ADRENAL GLANDS: No acute abnormality. KIDNEYS, URETERS AND BLADDER: No stones in the kidneys or ureters. No hydronephrosis. No perinephric or periureteral stranding. GI AND BOWEL: Wall thickening of the gastric antrum and duodenum is likely reactive secondary to pancreatitis. No bowel obstruction. Normal appendix. PERITONEUM AND RETROPERITONEUM: No ascites. No free air. VASCULATURE: Aortic atherosclerotic calcification. Portal vein, SMV and splenic veins are patent. LYMPH NODES: No lymphadenopathy. REPRODUCTIVE ORGANS: Hysterectomy. No adnexal mass. BONES AND SOFT TISSUES: No acute osseous abnormality. No focal soft tissue abnormality. IMPRESSION: 1. Marked edema and  stranding about the pancreas compatible with pancreatitis, progressed compared to 09/28/2023. No organized fluid collection, pancreatic necrosis, or pancreatic ductal dilation. 2. Cholelithiasis without evidence of acute cholecystitis. 3. Wall thickening of the gastric antrum and duodenum, likely reactive secondary to pancreatitis. No bowel obstruction. Electronically signed by: Norman Gatlin MD 10/03/2023 12:30 AM EDT RP Workstation: [id number]   DG Abd 2 Views Result Date: 10/02/2023 EXAM: 2 VIEW XRAY OF THE ABDOMEN 10/02/2023 02:42:45 PM COMPARISON: CT abdomen and pelvis dated 09/28/2023. CLINICAL HISTORY: Abdominal pain, nausea, vomiting, diarrhea since 09/26/2023. Patient fell off a tractor on 09/26/2023, symptoms started thereafter. FINDINGS: BOWEL: Nonobstructive bowel gas pattern. SOFT TISSUES: No abnormal calcifications or opaque urinary calculi. BONES: No acute osseous abnormality. Lower chest: Small bilateral pleural effusions. IMPRESSION: 1. No radiographically apparent acute findings in the abdomen. 2. Small bilateral pleural effusions Electronically signed by: Norman Gatlin MD 10/02/2023 11:59 PM EDT RP Workstation: [id number]   CT ABDOMEN PELVIS W CONTRAST Result Date: 09/28/2023 CLINICAL DATA:  Abdominal trauma, blunt Elevated lipase EXAM: CT ABDOMEN AND PELVIS WITH CONTRAST TECHNIQUE: Multidetector CT imaging of the abdomen and pelvis was performed using the standard protocol following bolus administration of intravenous contrast. RADIATION DOSE REDUCTION: This exam was performed according to the departmental dose-optimization program which includes automated exposure control, adjustment of the mA and/or kV according to patient size and/or use of iterative reconstruction technique. CONTRAST:  OMNIPAQUE  IOHEXOL  300 MG/ML  SOLN COMPARISON:  September 05, 2023, September 27, 2005 FINDINGS: Lower chest: No focal airspace consolidation or pleural effusion.Mild cardiomegaly. Bibasilar atelectasis.  Hepatobiliary: Unchanged cyst in the right hepatic lobe.Couple of small gallstones in the gallbladder neck. No wall thickening. Mild diffuse extrahepatic biliary ductal dilation again noted measuring up to 9 mm. Normal variant low insertion of the cystic duct on  the distal CBD. There is apparent enhancement of the ampulla of Vater at the major papilla (axial 37-38), coronal 66). The portal veins are patent. Pancreas: Small hypodensity in the pancreatic neck again noted measuring 5 mm. Diffuse acute peripancreatic fluid surrounding the entire pancreatic parenchyma, extending into the anterior pararenal spaces bilaterally, and both paracolic gutters, more so on the left than the right. Acute fluid also extends into the root of the mesentery with adjacent mesenteric edema. No well-formed or drainable peripancreatic fluid collection. Spleen: Normal size. No mass. Adrenals/Urinary Tract: No adrenal masses. Subcentimeter hypodensity noted in the right kidney, too small to definitively characterize, but likely a small cyst. No nephrolithiasis or hydronephrosis. The urinary bladder is distended without focal abnormality. Stomach/Bowel: The stomach contains ingested material without focal abnormality. There is a punctate calculus layering in the third portion of the duodenal (axial 46, coronal 66). No small bowel wall thickening or inflammation. No small bowel obstruction.Normal appendix. sigmoid colonic diverticulosis. No changes of acute diverticulitis. Vascular/Lymphatic: No aortic aneurysm. Scattered aortoiliac atherosclerosis. Retroaortic left renal vein. No intraabdominal or pelvic lymphadenopathy. Reproductive: Hysterectomy. No concerning adnexal mass.No free pelvic fluid. Other: No pneumoperitoneum. Musculoskeletal: No acute fracture or destructive lesion. IMPRESSION: 1. Acute interstitial edematous pancreatitis, likely gallstone pancreatitis. While the common bile duct is dilated and the ampulla appears inflamed,  there is no obstructive choledocholith. A likely passed calculus is layering in the third portion of the duodenal (axial 46). Given the enhancement of the ampulla, nonemergent upper endoscopy in 6-12 weeks should be considered to exclude underlying neoplasm once patient is clinically able. 2. Cholecystolithiasis.  No changes of acute cholecystitis. 3. Scattered sigmoid diverticulosis. Electronically Signed   By: Rogelia Myers M.D.   On: 09/28/2023 13:12   CT Head Wo Contrast Result Date: 09/28/2023 CLINICAL DATA:  Head trauma, moderate-severe with vomiting EXAM: CT HEAD WITHOUT CONTRAST TECHNIQUE: Contiguous axial images were obtained from the base of the skull through the vertex without intravenous contrast. RADIATION DOSE REDUCTION: This exam was performed according to the departmental dose-optimization program which includes automated exposure control, adjustment of the mA and/or kV according to patient size and/or use of iterative reconstruction technique. COMPARISON:  CT of the head dated September 26, 2023. FINDINGS: Brain: Normal brain. No evidence of hemorrhage, mass, cortical infarct or hydrocephalus. Vascular: Mild calcific atheromatous disease within the carotid siphons. Skull: Intact and unremarkable. Sinuses/Orbits: Clear paranasal sinuses. Status post bilateral lens replacement. Other: None. IMPRESSION: Normal.  No evidence of acute traumatic injury. Electronically Signed   By: Evalene Coho M.D.   On: 09/28/2023 11:54    Microbiology: Results for orders placed or performed during the hospital encounter of 06/07/20  SARS CORONAVIRUS 2 (TAT 6-24 HRS) Nasopharyngeal Nasopharyngeal Swab     Status: None   Collection Time: 06/07/20  9:14 AM   Specimen: Nasopharyngeal Swab  Result Value Ref Range Status   SARS Coronavirus 2 NEGATIVE NEGATIVE Final    Comment: (NOTE) SARS-CoV-2 target nucleic acids are NOT DETECTED.  The SARS-CoV-2 RNA is generally detectable in upper and lower respiratory  specimens during the acute phase of infection. Negative results do not preclude SARS-CoV-2 infection, do not rule out co-infections with other pathogens, and should not be used as the sole basis for treatment or other patient management decisions. Negative results must be combined with clinical observations, patient history, and epidemiological information. The expected result is Negative.  Fact Sheet for Patients: HairSlick.no  Fact Sheet for Healthcare Providers: quierodirigir.com  This test is not  yet approved or cleared by the [place]  FDA and  has been authorized for detection and/or diagnosis of SARS-CoV-2 by FDA under an Emergency Use Authorization (EUA). This EUA will remain  in effect (meaning this test can be used) for the duration of the COVID-19 declaration under Se ction 564(b)(1) of the Act, 21 U.S.C. section 360bbb-3(b)(1), unless the authorization is terminated or revoked sooner.  Performed at [facility], [address] [place], [place] [address]     Labs: CBC: Recent Labs  Lab 10/02/23 1440 10/03/23 1322 10/04/23 0317 10/05/23 0204 10/06/23 0215  WBC 15.9* 21.3* 22.8* 22.9* 19.4*  NEUTROABS 13.2*  --   --  19.5* 15.6*  HGB 14.1 13.8 13.1 12.4 12.2  HCT 42.5 39.6 37.3 35.5* 35.0*  MCV 93.5 90.4 89.4 89.9 90.9  PLT 421.0* 415* 408* 395 393   Basic Metabolic Panel: Recent Labs  Lab 10/02/23 2122 10/03/23 1322 10/04/23 0317 10/05/23 0204 10/06/23 0215 10/07/23 0222  NA  --  131* 130* 130* 132* 135  K  --  3.4* 3.4* 3.0* 3.0* 3.8  CL  --  96* 96* 95* 98 100  CO2  --  24 24 25 25 24   GLUCOSE  --  92 102* 91 100* 90  BUN  --  9 7* 5* <5* 5*  CREATININE  --  0.63 0.63 0.59 0.39* 0.47  CALCIUM   --  8.4* 8.3* 8.3* 8.4* 8.6*  MG 1.9  --   --   --   --   --    Liver Function Tests: Recent Labs  Lab 10/02/23 1440 10/03/23 1322 10/04/23 0317 10/05/23 0204  AST 29 31 35 47*  ALT  48* 38 36 36  ALKPHOS 99 97 95 96  BILITOT 0.6 0.6 0.7 0.7  PROT 6.6 5.9* 5.3* 4.9*  ALBUMIN 3.4* 2.3* 2.1* 1.8*   CBG: No results for input(s): GLUCAP in the last 168 hours.  Discharge time spent: 45 minutes.  Signed: Deliliah Room, MD [facility] 10/08/2023

## 2023-10-09 ENCOUNTER — Ambulatory Visit: Payer: Self-pay | Admitting: *Deleted

## 2023-10-09 NOTE — Care Management Important Message (Signed)
 Important Message  Patient Details  Name: Abigail Robinson MRN: [id number] Date of Birth: [birth year]   Important Message Given:  Yes - Medicare IM  Patient left prior to IM delivery will mail a copy to the patient home address.   Bradie Lacock 10/09/2023, 3:12 PM

## 2023-10-09 NOTE — Telephone Encounter (Signed)
 FYI Only or Action Required?: FYI only for provider.  Patient was last seen in primary care on [facility].  Called Nurse Triage reporting Abdominal Pain.  Symptoms began several days ago.  Interventions attempted: Nothing.  Symptoms are: gradually worsening.  Triage Disposition: See HCP Within 4 Hours (Or PCP Triage)  Patient/caregiver understands and will follow disposition?: Unsure              Copied from CRM [id number]. Topic: Clinical - Red Word Triage >> Oct 09, 2023  9:48 AM Suzen RAMAN wrote: Red Word that prompted transfer to Nurse Triage: Severe stomach pain,dx with pancreatitis recent ER visit(15th-21st) Reason for Disposition  [1] MILD-MODERATE pain AND [2] constant AND [3] present > 2 hours  Answer Assessment - Initial Assessment Questions Recommended to be seen today and to contact PCP at [facility] for evaluation or go to UC/ ED for SOB with exertion. Patient transferred to CAL at [facility] at [facility] for request of new patient appt to establish care.        1. LOCATION: Where does it hurt?      Across stomach lower area  2. RADIATION: Does the pain shoot anywhere else? (e.g., chest, back)     na 3. ONSET: When did the pain begin? (e.g., minutes, hours or days ago)      Thursday felt off of tracker and hit head and shoulder and was evaluated.  4. SUDDEN: Gradual or sudden onset?     Gradual  5. PATTERN Does the pain come and go, or is it constant?     Constant  6. SEVERITY: How bad is the pain?  (e.g., Scale 1-10; mild, moderate, or severe)     4/10  7. RECURRENT SYMPTOM: Have you ever had this type of stomach pain before? If Yes, ask: When was the last time? and What happened that time?      Yes  8. CAUSE: What do you think is causing the stomach pain? (e.g., gallstones, recent abdominal surgery)     Goal is to have gallbladder removed 9. RELIEVING/AGGRAVATING FACTORS: What makes it better or worse? (e.g., antacids,  bending or twisting motion, bowel movement)     Tylenol  ES  10. OTHER SYMPTOMS: Do you have any other symptoms? (e.g., back pain, diarrhea, fever, urination pain, vomiting)       Abdominal pain , loose stool. Not eating much only applesauce and broth . No fever. SOB with exertion 11. PREGNANCY: Is there any chance you are pregnant? When was your last menstrual period?       na  Protocols used: Abdominal Pain - Female-A-AH

## 2023-10-10 ENCOUNTER — Other Ambulatory Visit (INDEPENDENT_AMBULATORY_CARE_PROVIDER_SITE_OTHER)

## 2023-10-10 ENCOUNTER — Encounter: Payer: Self-pay | Admitting: Gastroenterology

## 2023-10-10 ENCOUNTER — Ambulatory Visit: Admitting: Family Medicine

## 2023-10-10 ENCOUNTER — Ambulatory Visit: Admitting: Gastroenterology

## 2023-10-10 ENCOUNTER — Ambulatory Visit: Payer: Self-pay

## 2023-10-10 VITALS — BP 122/80 | HR 86 | Temp 98.6°F | Ht 67.0 in | Wt 170.5 lb

## 2023-10-10 DIAGNOSIS — K851 Biliary acute pancreatitis without necrosis or infection: Secondary | ICD-10-CM

## 2023-10-10 LAB — CBC WITH DIFFERENTIAL/PLATELET
Basophils Absolute: 0 K/uL (ref 0.0–0.1)
Basophils Relative: 0.3 % (ref 0.0–3.0)
Eosinophils Absolute: 0.1 K/uL (ref 0.0–0.7)
Eosinophils Relative: 1 % (ref 0.0–5.0)
HCT: 37.2 % (ref 36.0–46.0)
Hemoglobin: 12.4 g/dL (ref 12.0–15.0)
Lymphocytes Relative: 7.1 % — ABNORMAL LOW (ref 12.0–46.0)
Lymphs Abs: 1 K/uL (ref 0.7–4.0)
MCHC: 33.4 g/dL (ref 30.0–36.0)
MCV: 91.8 fl (ref 78.0–100.0)
Monocytes Absolute: 0.9 K/uL (ref 0.1–1.0)
Monocytes Relative: 6.4 % (ref 3.0–12.0)
Neutro Abs: 11.9 K/uL — ABNORMAL HIGH (ref 1.4–7.7)
Neutrophils Relative %: 85.2 % — ABNORMAL HIGH (ref 43.0–77.0)
Platelets: 573 K/uL — ABNORMAL HIGH (ref 150.0–400.0)
RBC: 4.05 Mil/uL (ref 3.87–5.11)
RDW: 14.2 % (ref 11.5–15.5)
WBC: 13.9 K/uL — ABNORMAL HIGH (ref 4.0–10.5)

## 2023-10-10 LAB — COMPREHENSIVE METABOLIC PANEL WITH GFR
ALT: 38 U/L — ABNORMAL HIGH (ref 0–35)
AST: 38 U/L — ABNORMAL HIGH (ref 0–37)
Albumin: 3 g/dL — ABNORMAL LOW (ref 3.5–5.2)
Alkaline Phosphatase: 231 U/L — ABNORMAL HIGH (ref 39–117)
BUN: 7 mg/dL (ref 6–23)
CO2: 28 meq/L (ref 19–32)
Calcium: 8.9 mg/dL (ref 8.4–10.5)
Chloride: 100 meq/L (ref 96–112)
Creatinine, Ser: 0.53 mg/dL (ref 0.40–1.20)
GFR: 91.05 mL/min (ref >60.00)
Glucose, Bld: 111 mg/dL — ABNORMAL HIGH (ref 70–99)
Potassium: 3.6 meq/L (ref 3.5–5.1)
Sodium: 137 meq/L (ref 135–145)
Total Bilirubin: 0.5 mg/dL (ref 0.2–1.2)
Total Protein: 6.5 g/dL (ref 6.0–8.3)

## 2023-10-10 LAB — LIPASE: Lipase: 384 U/L — ABNORMAL HIGH (ref 11.0–59.0)

## 2023-10-10 LAB — PROTIME-INR
INR: 1.4 ratio — ABNORMAL HIGH (ref 0.8–1.0)
Prothrombin Time: 14.8 s — ABNORMAL HIGH (ref 9.6–13.1)

## 2023-10-10 NOTE — Progress Notes (Signed)
 10/10/2023 Abigail Robinson [id number] [birth year]   HISTORY OF PRESENT ILLNESS:  this is a 73 year old female who is a patient of Dr. Melba.  She was admitted with acute pancreatitis onset 09/27/2023.  Initially evaluated in the ER on 09/28/2023 with diagnosis of acute pancreatitis and discharged but had ongoing symptoms with progress in abdominal pain and nausea.  She was admitted for further evaluation with initial imaging showing stones in the gallbladder neck and dilated CBD with enhancement of the ampulla with a punctate stone in the first portion of the duodenum.  She initially had mild elevation of her liver function tests which then normalized.  Lipase ahd also normalized.     MRCP 10/04/2023 did not show any evidence of retained stone -only mild extrahepatic biliary ductal dilation with common hepatic duct measuring 0.9 cm.  There was very extensive inflammatory fat stranding around the pancreas and adjacent retroperitoneum concerning for organizing acute pancreatic fluid collections.  Discharged on 7/21.  Was feeling better this morning, but currently does not feel great, rates pain as about a 2-3 out of 10.  Ate some blueberries and granola this morning.  Drinking fluids.  Had 2 bowel movements so far today.   Past Medical History:  Diagnosis Date   Allergy    Arthritis    back, hands   Basal cell carcinoma    face   Colon polyps    Complication of anesthesia    prolonged sedation, slow to wake up   Diplopia    Endometriosis    GERD (gastroesophageal reflux disease)    Headache    Migraines   Hyperlipidemia    Hypertension    Hypothyroidism    Pancreatitis    Skin cancer    hx of   Status post dilation of esophageal narrowing    Thalamic cyst    Thyroid  disease    Vertigo    Past Surgical History:  Procedure Laterality Date   ANTERIOR AND POSTERIOR REPAIR WITH SACROSPINOUS FIXATION N/A 07/08/2015   Procedure: ANTERIOR AND POSTERIOR REPAIR ;  Surgeon: Alm Cook, MD;  Location: [facility];  Service: Gynecology;  Laterality: N/A;   COLONOSCOPY     DIAGNOSTIC LAPAROSCOPY     LAPAROSCOPIC VAGINAL HYSTERECTOMY WITH SALPINGO OOPHORECTOMY Bilateral 07/08/2015   Procedure: LAPAROSCOPIC ASSISTED VAGINAL HYSTERECTOMY WITH BILATERAL SALPINGO OOPHORECTOMY;  Surgeon: Alm Cook, MD;  Location: [facility];  Service: Gynecology;  Laterality: Bilateral;   OOPHORECTOMY  age 83   for endometrosis    reports that she has never smoked. She has never used smokeless tobacco. She reports that she does not currently use alcohol . She reports that she does not use drugs. family history includes Breast cancer in her maternal grandmother; Cirrhosis in her father; Diabetes in her brother; Heart attack in her brother; Heart disease in her father; Hypertension in her father; Kidney cancer in her mother; Liver cancer in her mother; Lung cancer in her mother; Macular degeneration in her father; Stroke in her maternal grandmother. Allergies  Allergen Reactions   Demerol [Meperidine] Nausea Only and Other (See Comments)    Reaction=headache   Gadolinium Dermatitis      Outpatient Encounter Medications as of 10/10/2023  Medication Sig   acetaminophen  (TYLENOL ) 500 MG tablet Take 1,000 mg by mouth 2 (two) times daily as needed for moderate pain or headache.    ascorbic acid (VITAMIN C) 1000 MG tablet Take 1,000 mg by mouth daily.   atenolol  (TENORMIN ) 25 MG tablet Take 25  mg by mouth daily.   Calcium  Carb-Cholecalciferol 500-600 MG-UNIT TABS Take 1 tablet by mouth daily.   DULoxetine  (CYMBALTA ) 20 MG capsule Take 2 capsules (40 mg total) by mouth daily.   gabapentin  (NEURONTIN ) 100 MG capsule Take 2 capsules (200 mg total) by mouth at bedtime. (Patient taking differently: Take 100 mg by mouth at bedtime.)   hydrochlorothiazide (MICROZIDE) 12.5 MG capsule Take 12.5 mg by mouth as needed (fluid).   levothyroxine  (SYNTHROID , LEVOTHROID) 75 MCG tablet Take 75 mcg by mouth at bedtime.     Omega-3 Fatty Acids (FISH OIL) 1000 MG CAPS Take 1,000 mg by mouth daily.   omeprazole  (PRILOSEC) 20 MG capsule Take 1 capsule (20 mg total) by mouth 2 (two) times daily.   ondansetron  (ZOFRAN -ODT) 4 MG disintegrating tablet Take 1 tablet (4 mg total) by mouth every 8 (eight) hours as needed for nausea or vomiting.   tiZANidine  (ZANAFLEX ) 2 MG tablet Take 1 tablet (2 mg total) by mouth at bedtime. (Patient taking differently: Take 2 mg by mouth as needed for muscle spasms.)   vitamin B-12 (CYANOCOBALAMIN ) 1000 MCG tablet Take 1,000 mcg by mouth daily.   Vitamins A & D (VITAMIN A & D) 5000-400 UNITS CAPS Take 1 capsule by mouth daily.   No facility-administered encounter medications on file as of 10/10/2023.     REVIEW OF SYSTEMS  : All other systems reviewed and negative except where noted in the History of Present Illness.   PHYSICAL EXAM: BP 122/80   Pulse 86   Temp 98.6 F (37 C)   Ht 5' 7 (1.702 m)   Wt 170 lb 8 oz (77.3 kg)   BMI 26.70 kg/m  General: Well developed white female in no acute distress; in wheelchair Head: Normocephalic and atraumatic Eyes:  Sclerae anicteric, conjunctiva pink. Ears: Normal auditory acuity Lungs: Clear throughout to auscultation; no W/R/R. Heart: Regular rate and rhythm; no M/R/G. Abdomen: Soft, non-distended.  BS present.  Minimal upper abdominal TTP. Musculoskeletal: Symmetrical with no gross deformities  Skin: No lesions on visible extremities Extremities:  1-2+ pitting edema in B/L LEs with L>R. Neurological: Alert oriented x 4, grossly non-focal Psychological:  Alert and cooperative. Normal mood and affect  ASSESSMENT AND PLAN: 73 year old female admitted with acute pancreatitis onset 09/27/2023.  Initially evaluated in the ER on 09/28/2023 with diagnosis of acute pancreatitis and discharged but had ongoing symptoms with progress in abdominal pain and nausea.  She was admitted for further evaluation with initial imaging showing stones in the  gallbladder neck and dilated CBD with enhancement of the ampulla with a punctate stone in the first portion of the duodenum.  She initially had mild elevation of her liver function tests which then normalized.  Lipase ahd also normalized.     MRCP 10/04/2023 did not show any evidence of retained stone -only mild extrahepatic biliary ductal dilation with common hepatic duct measuring 0.9 cm.  There was very extensive inflammatory fat stranding around the pancreas and adjacent retroperitoneum concerning for organizing acute pancreatic fluid collections.  Discharged on 7/21.  Was feeling better this morning, but currently does not feel great, rates pain as about a 2-3 out of 10.  - Repeat CT scan of the abdomen and pelvis with contrast next week, 2 weeks from the last imaging. - Labs today including CBC, CMP, PT/INR, and lipase. - Continue pushing fluids, low-fat diet as tolerated. - If pain worsens or she is unable to take p.o. needs to return to the ED.  CC:  Stephane Leita DEL, MD

## 2023-10-10 NOTE — Patient Instructions (Signed)
 Your provider has requested that you go to the basement level for lab work before leaving today. Press B on the elevator. The lab is located at the first door on the left as you exit the elevator.  You have been scheduled for a CT scan of the abdomen and pelvis at [facility], 1st floor Radiology. You are scheduled on Thursday 10/18/23 at 10:30 am. You should arrive 15 minutes prior to your appointment time for registration.   Please follow the written instructions below on the day of your exam:   1) Do not eat anything after 8:30 am (4 hours prior to your test)   You may take any medications as prescribed with a small amount of water, if necessary. If you take any of the following medications: METFORMIN, GLUCOPHAGE, GLUCOVANCE, AVANDAMET, RIOMET, FORTAMET, ACTOPLUS MET, JANUMET, GLUMETZA or METAGLIP, you MAY be asked to HOLD this medication 48 hours AFTER the exam.   The purpose of you drinking the oral contrast is to aid in the visualization of your intestinal tract. The contrast solution may cause some diarrhea. Depending on your individual set of symptoms, you may also receive an intravenous injection of x-ray contrast/dye. Plan on being at [facility] for 45 minutes or longer, depending on the type of exam you are having performed.   If you have any questions regarding your exam or if you need to reschedule, you may call [facility] at [phone] between the hours of 8:00 am and 5:00 pm, Monday-Friday.

## 2023-10-10 NOTE — Progress Notes (Signed)
 Attending Physician's Attestation   I have reviewed the chart.   I agree with the Advanced Practitioner's note, impression, and recommendations with any updates as below. Hopeful that patient is just having a slightly off day today.  She does not need general surgery follow-up for cholecystectomy timing.  With this being said, if laboratories are concerning, then she may require some additional workup/follow-up.  At a minimum continuing to move forward with the plan of a CT scan next week to see for organization of the peripancreatic fluid collections is reasonable.  If she dramatically worsens, she may need earlier repeat imaging.   Aloha Finner, MD [facility] Office # [phone]

## 2023-10-10 NOTE — Telephone Encounter (Signed)
-----   Message from Anderson D. Zehr sent at 10/10/2023  4:15 PM EDT ----- Please let the patient know that her lipase is slightly higher today, which she probably saw.  Also her alk phos, which is one of her liver enzymes is higher as well.  Please have her come back for a  repeat CBC, CMP, lipase, PT/INR as well as a CRP (not high-sensitivity) and sed rate on Friday.  Please also prescribe vitamin K  10 mg daily x 3 days for now as well.  Please have her give us  an  update on how she is feeling tomorrow.  Also, she sent a patient messageabout getting blood because it has been 10 for a while now.  Her hemoglobin is normal.  Not quite sure what she is referring  to.  Thank you,  Jess  ----- Message ----- From: Wilhelmenia Aloha Raddle., MD Sent: 10/10/2023   1:16 PM EDT To: Harlene JONETTA Mail, PA-C  Probably have her come in on Friday for additional labs (CMP/CBC/lipase).  Have the nurses check with her tomorrow and on Friday, if she has worsening then may need imaging study done on Friday or  Saturday to see if progressive issues are occurring otherwise if stable and slowly improving then imaging study next week. I would recommend she get 10 mg oral vitamin K  daily and when you repeat laboratories, we check her INR again. Check a CRP (not high-sensitivity) and ESR as well with next set of labs. Thanks. GM ----- Message ----- From: Mail Harlene JONETTA, PA-C Sent: 10/10/2023  12:58 PM EDT To: Aloha Wilhelmenia Raddle., MD  Just wanted to get your input.  This is her labs from today.  Please see my note.  Was feeling about 50% improved this morning, but then was not feeling great when she saw me.  Still only about a 2  or 3 out of 10 on pain, did tolerate some breakfast.  LFTs up a little bit, especially alk phos.  Lipase up again as well.  She is scheduled for another CT scan next week tentatively.  Any other  recommendations?  She is also asking if she could resume taking her low-dose hydrochlorothiazide  as she has some lower extremity swelling.  She had that at home from prior to this hospitalization.  Thank you!  Jess ----- Message ----- From: Interface, Lab In [facility] Sent: 10/10/2023  11:33 AM EDT To: Harlene JONETTA Mail, PA-C

## 2023-10-11 ENCOUNTER — Other Ambulatory Visit: Payer: Self-pay

## 2023-10-11 DIAGNOSIS — R109 Unspecified abdominal pain: Secondary | ICD-10-CM

## 2023-10-11 DIAGNOSIS — K838 Other specified diseases of biliary tract: Secondary | ICD-10-CM

## 2023-10-11 DIAGNOSIS — K7689 Other specified diseases of liver: Secondary | ICD-10-CM

## 2023-10-11 DIAGNOSIS — K851 Biliary acute pancreatitis without necrosis or infection: Secondary | ICD-10-CM

## 2023-10-11 MED ORDER — VITAMIN K 100 MCG PO TABS: 100.0000 ug | ORAL_TABLET | Freq: Every day | ORAL | 0 refills | Status: AC

## 2023-10-12 ENCOUNTER — Other Ambulatory Visit (INDEPENDENT_AMBULATORY_CARE_PROVIDER_SITE_OTHER)

## 2023-10-12 ENCOUNTER — Ambulatory Visit: Payer: Self-pay | Admitting: Gastroenterology

## 2023-10-12 DIAGNOSIS — K7689 Other specified diseases of liver: Secondary | ICD-10-CM | POA: Diagnosis not present

## 2023-10-12 DIAGNOSIS — R109 Unspecified abdominal pain: Secondary | ICD-10-CM

## 2023-10-12 DIAGNOSIS — K838 Other specified diseases of biliary tract: Secondary | ICD-10-CM | POA: Diagnosis not present

## 2023-10-12 DIAGNOSIS — K851 Biliary acute pancreatitis without necrosis or infection: Secondary | ICD-10-CM | POA: Diagnosis not present

## 2023-10-12 LAB — COMPREHENSIVE METABOLIC PANEL WITH GFR
ALT: 26 U/L (ref 0–35)
AST: 32 U/L (ref 0–37)
Albumin: 3.1 g/dL — ABNORMAL LOW (ref 3.5–5.2)
Alkaline Phosphatase: 154 U/L — ABNORMAL HIGH (ref 39–117)
BUN: 6 mg/dL (ref 6–23)
CO2: 27 meq/L (ref 19–32)
Calcium: 8.8 mg/dL (ref 8.4–10.5)
Chloride: 102 meq/L (ref 96–112)
Creatinine, Ser: 0.52 mg/dL (ref 0.40–1.20)
GFR: 91.47 mL/min (ref >60.00)
Glucose, Bld: 103 mg/dL — ABNORMAL HIGH (ref 70–99)
Potassium: 3.8 meq/L (ref 3.5–5.1)
Sodium: 136 meq/L (ref 135–145)
Total Bilirubin: 0.5 mg/dL (ref 0.2–1.2)
Total Protein: 6.5 g/dL (ref 6.0–8.3)

## 2023-10-12 LAB — CBC WITH DIFFERENTIAL/PLATELET
Basophils Absolute: 0.1 K/uL (ref 0.0–0.1)
Basophils Relative: 0.7 % (ref 0.0–3.0)
Eosinophils Absolute: 0.1 K/uL (ref 0.0–0.7)
Eosinophils Relative: 1 % (ref 0.0–5.0)
HCT: 35.8 % — ABNORMAL LOW (ref 36.0–46.0)
Hemoglobin: 12 g/dL (ref 12.0–15.0)
Lymphocytes Relative: 14.6 % (ref 12.0–46.0)
Lymphs Abs: 1.7 K/uL (ref 0.7–4.0)
MCHC: 33.4 g/dL (ref 30.0–36.0)
MCV: 91.8 fl (ref 78.0–100.0)
Monocytes Absolute: 1 K/uL (ref 0.1–1.0)
Monocytes Relative: 8.1 % (ref 3.0–12.0)
Neutro Abs: 9 K/uL — ABNORMAL HIGH (ref 1.4–7.7)
Neutrophils Relative %: 75.6 % (ref 43.0–77.0)
Platelets: 709 K/uL — ABNORMAL HIGH (ref 150.0–400.0)
RBC: 3.91 Mil/uL (ref 3.87–5.11)
RDW: 14.2 % (ref 11.5–15.5)
WBC: 12 K/uL — ABNORMAL HIGH (ref 4.0–10.5)

## 2023-10-12 LAB — PROTIME-INR
INR: 1.4 ratio — ABNORMAL HIGH (ref 0.8–1.0)
Prothrombin Time: 14.5 s — ABNORMAL HIGH (ref 9.6–13.1)

## 2023-10-12 LAB — SEDIMENTATION RATE: Sed Rate: 84 mm/h — ABNORMAL HIGH (ref 0–30)

## 2023-10-12 LAB — C-REACTIVE PROTEIN: CRP: 13.3 mg/dL (ref 0.5–20.0)

## 2023-10-12 LAB — LIPASE: Lipase: 234 U/L — ABNORMAL HIGH (ref 11.0–59.0)

## 2023-10-12 NOTE — Telephone Encounter (Signed)
 Spoke with patient. Discussed provider recommendations of Vitamin K -2. Patient reports that is what the pharmacist recommended.

## 2023-10-15 ENCOUNTER — Telehealth: Payer: Self-pay | Admitting: Gastroenterology

## 2023-10-15 NOTE — Telephone Encounter (Signed)
 Spoke with patient. She reports she is feeling so much better. Reports she has lost 10 pounds. Fluid is completely gone from legs and her abdominal swelling is also almost gone completely. Patient reports she has been able to up and mobile around the house as well. Recommended patient to continue with CT scan on Thursday even though she is feeling better. Patient agreed and reports she plans to have it done.

## 2023-10-15 NOTE — Telephone Encounter (Signed)
 Inbound call from patient stating she was told to call Monday morning to speak to nurse in regards to recent blood work Requesting a call back   Please advise  Thank you

## 2023-10-17 NOTE — Progress Notes (Deleted)
 [name] [address] [place] [address] Phone: [phone] Subjective:    I'm seeing this patient by the request  of:  Stephane Leita DEL, MD  CC:   YEP:[id number]  Abigail Robinson is a 73 y.o. female coming in with complaint of shoulder anda thumb pain. Would like injections today. Has not started PT with [facility]. Patient states      Past Medical History:  Diagnosis Date   Allergy    Arthritis    back, hands   Basal cell carcinoma    face   Colon polyps    Complication of anesthesia    prolonged sedation, slow to wake up   Diplopia    Endometriosis    GERD (gastroesophageal reflux disease)    Headache    Migraines   Hyperlipidemia    Hypertension    Hypothyroidism    Pancreatitis    Skin cancer    hx of   Status post dilation of esophageal narrowing    Thalamic cyst    Thyroid  disease    Vertigo    Past Surgical History:  Procedure Laterality Date   ANTERIOR AND POSTERIOR REPAIR WITH SACROSPINOUS FIXATION N/A 07/08/2015   Procedure: ANTERIOR AND POSTERIOR REPAIR ;  Surgeon: Alm Cook, MD;  Location: [facility];  Service: Gynecology;  Laterality: N/A;   COLONOSCOPY     DIAGNOSTIC LAPAROSCOPY     LAPAROSCOPIC VAGINAL HYSTERECTOMY WITH SALPINGO OOPHORECTOMY Bilateral 07/08/2015   Procedure: LAPAROSCOPIC ASSISTED VAGINAL HYSTERECTOMY WITH BILATERAL SALPINGO OOPHORECTOMY;  Surgeon: Alm Cook, MD;  Location: [facility];  Service: Gynecology;  Laterality: Bilateral;   OOPHORECTOMY  age 65   for endometrosis   Social History   Socioeconomic History   Marital status: Widowed    Spouse name: Not on file   Number of children: 0   Years of education: Not on file   Highest education level: Bachelor's degree (e.g., BA, AB, BS)  Occupational History    Comment: retired  Tobacco Use   Smoking status: Never   Smokeless tobacco: Never  Vaping Use   Vaping status: Never Used  Substance and Sexual Activity   Alcohol  use: Not Currently    Drug use: No   Sexual activity: Not Currently  Other Topics Concern   Not on file  Social History Narrative   Lives alone   Social Drivers of Health   Financial Resource Strain: Not on file  Food Insecurity: No Food Insecurity (10/03/2023)   Hunger Vital Sign    Worried About Running Out of Food in the Last Year: Never true    Ran Out of Food in the Last Year: Never true  Transportation Needs: No Transportation Needs (10/03/2023)   PRAPARE - Administrator, Civil Service (Medical): No    Lack of Transportation (Non-Medical): No  Physical Activity: Not on file  Stress: Not on file  Social Connections: Unknown (10/03/2023)   Social Connection and Isolation Panel    Frequency of Communication with Friends and Family: Three times a week    Frequency of Social Gatherings with Friends and Family: Three times a week    Attends Religious Services: More than 4 times per year    Active Member of Clubs or Organizations: Yes    Attends Banker Meetings: More than 4 times per year    Marital Status: Patient declined   Allergies  Allergen Reactions   Demerol [Meperidine] Nausea Only and Other (See Comments)  Reaction=headache   Gadolinium Dermatitis   Family History  Problem Relation Age of Onset   Liver cancer Mother    Kidney cancer Mother    Lung cancer Mother    Heart disease Father    Hypertension Father    Cirrhosis Father    Macular degeneration Father    Diabetes Brother    Heart attack Brother    Breast cancer Maternal Grandmother    Stroke Maternal Grandmother    Colon cancer Neg Hx    Esophageal cancer Neg Hx    Stomach cancer Neg Hx     Current Outpatient Medications (Endocrine & Metabolic):    levothyroxine  (SYNTHROID , LEVOTHROID) 75 MCG tablet, Take 75 mcg by mouth at bedtime.   Current Outpatient Medications (Cardiovascular):    atenolol  (TENORMIN ) 25 MG tablet, Take 25 mg by mouth daily.   hydrochlorothiazide (MICROZIDE) 12.5 MG  capsule, Take 12.5 mg by mouth as needed (fluid).   Current Outpatient Medications (Analgesics):    acetaminophen  (TYLENOL ) 500 MG tablet, Take 1,000 mg by mouth 2 (two) times daily as needed for moderate pain or headache.   Current Outpatient Medications (Hematological):    vitamin B-12 (CYANOCOBALAMIN ) 1000 MCG tablet, Take 1,000 mcg by mouth daily.  Current Outpatient Medications (Other):    ascorbic acid (VITAMIN C) 1000 MG tablet, Take 1,000 mg by mouth daily.   Calcium  Carb-Cholecalciferol 500-600 MG-UNIT TABS, Take 1 tablet by mouth daily.   DULoxetine  (CYMBALTA ) 20 MG capsule, Take 2 capsules (40 mg total) by mouth daily.   gabapentin  (NEURONTIN ) 100 MG capsule, Take 2 capsules (200 mg total) by mouth at bedtime. (Patient taking differently: Take 100 mg by mouth at bedtime.)   Omega-3 Fatty Acids (FISH OIL) 1000 MG CAPS, Take 1,000 mg by mouth daily.   omeprazole  (PRILOSEC) 20 MG capsule, Take 1 capsule (20 mg total) by mouth 2 (two) times daily.   ondansetron  (ZOFRAN -ODT) 4 MG disintegrating tablet, Take 1 tablet (4 mg total) by mouth every 8 (eight) hours as needed for nausea or vomiting.   tiZANidine  (ZANAFLEX ) 2 MG tablet, Take 1 tablet (2 mg total) by mouth at bedtime. (Patient taking differently: Take 2 mg by mouth as needed for muscle spasms.)   vitamin k  100 MCG tablet, Take 1 tablet (100 mcg total) by mouth daily.   Vitamins A & D (VITAMIN A & D) 5000-400 UNITS CAPS, Take 1 capsule by mouth daily.   Reviewed prior external information including notes and imaging from  primary care provider As well as notes that were available from care everywhere and other healthcare systems.  Past medical history, social, surgical and family history all reviewed in electronic medical record.  No pertanent information unless stated regarding to the chief complaint.   Review of Systems:  No headache, visual changes, nausea, vomiting, diarrhea, constipation, dizziness, abdominal pain, skin  rash, fevers, chills, night sweats, weight loss, swollen lymph nodes, body aches, joint swelling, chest pain, shortness of breath, mood changes. POSITIVE muscle aches  Objective  There were no vitals taken for this visit.   General: No apparent distress alert and oriented x3 mood and affect normal, dressed appropriately.  HEENT: Pupils equal, extraocular movements intact  Respiratory: Patient's speak in full sentences and does not appear short of breath  Cardiovascular: No lower extremity edema, non tender, no erythema      Impression and Recommendations:

## 2023-10-18 ENCOUNTER — Ambulatory Visit (HOSPITAL_COMMUNITY)
Admission: RE | Admit: 2023-10-18 | Discharge: 2023-10-18 | Disposition: A | Discharge status: Home / Self Care | Source: Ambulatory Visit | Attending: Gastroenterology | Admitting: Gastroenterology

## 2023-10-18 DIAGNOSIS — K859 Acute pancreatitis without necrosis or infection, unspecified: Secondary | ICD-10-CM | POA: Diagnosis not present

## 2023-10-18 DIAGNOSIS — K851 Biliary acute pancreatitis without necrosis or infection: Secondary | ICD-10-CM | POA: Insufficient documentation

## 2023-10-18 DIAGNOSIS — K7689 Other specified diseases of liver: Secondary | ICD-10-CM | POA: Diagnosis not present

## 2023-10-18 DIAGNOSIS — R188 Other ascites: Secondary | ICD-10-CM | POA: Diagnosis not present

## 2023-10-18 MED ORDER — IOHEXOL 300 MG/ML  SOLN
100.0000 mL | Freq: Once | INTRAMUSCULAR | Status: AC | PRN
  Administered 2023-10-18: 100 mL via INTRAVENOUS

## 2023-10-22 ENCOUNTER — Ambulatory Visit: Admitting: Family Medicine

## 2023-10-23 ENCOUNTER — Encounter: Payer: Self-pay | Admitting: Gastroenterology

## 2023-10-29 ENCOUNTER — Ambulatory Visit (INDEPENDENT_AMBULATORY_CARE_PROVIDER_SITE_OTHER): Admitting: Physician Assistant

## 2023-10-29 ENCOUNTER — Ambulatory Visit (INDEPENDENT_AMBULATORY_CARE_PROVIDER_SITE_OTHER): Admitting: Audiology

## 2023-10-31 ENCOUNTER — Ambulatory Visit (INDEPENDENT_AMBULATORY_CARE_PROVIDER_SITE_OTHER): Admitting: Sports Medicine

## 2023-10-31 VITALS — BP 118/82 | HR 58 | Ht 67.0 in | Wt 154.0 lb

## 2023-10-31 DIAGNOSIS — M545 Low back pain, unspecified: Secondary | ICD-10-CM | POA: Diagnosis not present

## 2023-10-31 DIAGNOSIS — M546 Pain in thoracic spine: Secondary | ICD-10-CM

## 2023-10-31 DIAGNOSIS — R0781 Pleurodynia: Secondary | ICD-10-CM

## 2023-10-31 NOTE — Patient Instructions (Signed)
 Tylenol  [phone] mg 2-3 times a day for pain relief   Keep follow up with PCP  As needed follow up

## 2023-10-31 NOTE — Progress Notes (Signed)
 [facility] [address] [place] [address] Phone: [phone]   Assessment and Plan:     1. Rib pain on left side 2. Thoracolumbar back pain  -Subacute, improving, initial visit - Overall improving left posterior rib cage pain, left thoracolumbar pain from fall off of tractor in July 2025.  Consistent with flare of thoracolumbar degenerative changes and rib cage contusion that is gradually improving with rest - Patient was seen in ER after fall and had a subsequent hospitalization in July 2025 for acute pancreatitis.  Hospitalization resulted in several CT abdomen pelvis images that revealed no acute fracture of rib cage, and no vertebral fracture of thoracolumbar spine - Use Tylenol  500 to 1000 mg tablets 2-3 times a day for day-to-day pain relief - Continue activity as tolerated - Continue follow-up with specialist and primary care after hospital discharge for treatment of acute pancreatitis  Pertinent previous records reviewed include CT abdomen pelvis 10/18/2023, ER note 10/02/2023, ER note 09/28/2023  Follow Up: As needed if no improvement or worsening symptoms   Subjective:   I, Abigail Robinson, am serving as a Neurosurgeon for Doctor Morene Mace  Chief Complaint: rib pain   HPI:   10/31/23 Patient is a 73 year old female with rib pain . Patient states 09/2023 fell off of a tractor. Was seen in ED. Thoraco/lumbar back pain. Pain for the last 2 weeks. Isnt able to take a deep breath. Decreased ROM. Pain is getting a little better.   Relevant Historical Information: Recent hospitalization for acute pancreatitis, GERD, hypothyroidism  Additional pertinent review of systems negative.   Current Outpatient Medications:    acetaminophen  (TYLENOL ) 500 MG tablet, Take 1,000 mg by mouth 2 (two) times daily as needed for moderate pain or headache. , Disp: , Rfl:    ascorbic acid (VITAMIN C) 1000 MG tablet, Take 1,000 mg by mouth  daily., Disp: , Rfl:    atenolol  (TENORMIN ) 25 MG tablet, Take 25 mg by mouth daily., Disp: , Rfl:    Calcium  Carb-Cholecalciferol 500-600 MG-UNIT TABS, Take 1 tablet by mouth daily., Disp: , Rfl:    DULoxetine  (CYMBALTA ) 20 MG capsule, Take 2 capsules (40 mg total) by mouth daily., Disp: 180 capsule, Rfl: 0   gabapentin  (NEURONTIN ) 100 MG capsule, Take 2 capsules (200 mg total) by mouth at bedtime. (Patient taking differently: Take 100 mg by mouth at bedtime.), Disp: 120 capsule, Rfl: 0   hydrochlorothiazide  (MICROZIDE ) 12.5 MG capsule, Take 12.5 mg by mouth as needed (fluid)., Disp: , Rfl:    levothyroxine  (SYNTHROID , LEVOTHROID) 75 MCG tablet, Take 75 mcg by mouth at bedtime. , Disp: , Rfl:    Omega-3 Fatty Acids (FISH OIL) 1000 MG CAPS, Take 1,000 mg by mouth daily., Disp: , Rfl:    omeprazole  (PRILOSEC) 20 MG capsule, Take 1 capsule (20 mg total) by mouth 2 (two) times daily., Disp: 30 capsule, Rfl: 0   ondansetron  (ZOFRAN -ODT) 4 MG disintegrating tablet, Take 1 tablet (4 mg total) by mouth every 8 (eight) hours as needed for nausea or vomiting., Disp: 20 tablet, Rfl: 0   tiZANidine  (ZANAFLEX ) 2 MG tablet, Take 1 tablet (2 mg total) by mouth at bedtime. (Patient taking differently: Take 2 mg by mouth as needed for muscle spasms.), Disp: 30 tablet, Rfl: 0   vitamin B-12 (CYANOCOBALAMIN ) 1000 MCG tablet, Take 1,000 mcg by mouth daily., Disp: , Rfl:    vitamin k  100 MCG tablet, Take 1 tablet (100 mcg  total) by mouth daily., Disp: 3 tablet, Rfl: 0   Vitamins A & D (VITAMIN A & D) 5000-400 UNITS CAPS, Take 1 capsule by mouth daily., Disp: , Rfl:    Objective:     Vitals:   10/31/23 0841  BP: 118/82  Pulse: (!) 58  SpO2: 97%  Weight: 154 lb (69.9 kg)  Height: 5' 7 (1.702 m)      Body mass index is 24.12 kg/m.    Physical Exam:    Gen: Appears well, nad, nontoxic and pleasant Psych: Alert and oriented, appropriate mood and affect Neuro: sensation intact, strength is 5/5 in upper and  lower extremities, muscle tone wnl Skin: no susupicious lesions or rashes  Back - Normal skin, Spine with normal alignment and no deformity.   No tenderness to vertebral process palpation.   Left thoracolumbar lumbar region paraspinous muscles are mildly tender and without spasm TTP left posterior inferior rib cage.  No flail chest.  Equivalent rib cage motion with inhalation and exhalation without pain.   Gait normal    Electronically signed by:  [facility] 9:01 AM 10/31/23

## 2023-11-01 ENCOUNTER — Ambulatory Visit (INDEPENDENT_AMBULATORY_CARE_PROVIDER_SITE_OTHER): Admitting: Physician Assistant

## 2023-11-01 ENCOUNTER — Encounter (INDEPENDENT_AMBULATORY_CARE_PROVIDER_SITE_OTHER): Payer: Self-pay | Admitting: Physician Assistant

## 2023-11-01 VITALS — BP 111/77 | HR 86

## 2023-11-01 DIAGNOSIS — H65192 Other acute nonsuppurative otitis media, left ear: Secondary | ICD-10-CM

## 2023-11-01 NOTE — Progress Notes (Signed)
 Dear Dr. Stephane, Here is my assessment for our mutual patient, Abigail Robinson. Thank you for allowing me the opportunity to care for your patient. Please do not hesitate to contact me should you have any other questions. Sincerely, Chyrl Cohen PA-C  Otolaryngology Clinic Note Referring provider: Dr. Stephane HPI:  Abigail Robinson is a 73 y.o. female kindly referred by Dr. Stephane   The patient is a 73 year old female seen in our office for follow-up evaluation of otitis media.  The patient was last seen in the office on 08/24/2023.  Below is a recap of that encounter.  Update 11/01/2023.  Since her last office visit she feels like there is still fluid in the left ear, she also notes that there may be some fluid in the right.  She notes it is feeling better noting her hearing has improved by approximately 30%.  She denies any pain, no dizziness, no significant ringing.  She has continued using the Flonase but is using it intermittently along with the antihistamine and saline irrigation.  Original plan was for her to follow-up in our office with repeat audiogram, she did have some issues making it to the office at her scheduled time and that audiogram had to be rescheduled.  Independent Review of Additional Tests or Records:  None   PMH/Meds/All/SocHx/FamHx/ROS:   Past Medical History:  Diagnosis Date   Allergy    Arthritis    back, hands   Basal cell carcinoma    face   Colon polyps    Complication of anesthesia    prolonged sedation, slow to wake up   Diplopia    Endometriosis    GERD (gastroesophageal reflux disease)    Headache    Migraines   Hyperlipidemia    Hypertension    Hypothyroidism    Pancreatitis    Skin cancer    hx of   Status post dilation of esophageal narrowing    Thalamic cyst    Thyroid  disease    Vertigo      Past Surgical History:  Procedure Laterality Date   ANTERIOR AND POSTERIOR REPAIR WITH SACROSPINOUS FIXATION N/A 07/08/2015   Procedure: ANTERIOR AND  POSTERIOR REPAIR ;  Surgeon: Alm Cook, MD;  Location: [facility];  Service: Gynecology;  Laterality: N/A;   COLONOSCOPY     DIAGNOSTIC LAPAROSCOPY     LAPAROSCOPIC VAGINAL HYSTERECTOMY WITH SALPINGO OOPHORECTOMY Bilateral 07/08/2015   Procedure: LAPAROSCOPIC ASSISTED VAGINAL HYSTERECTOMY WITH BILATERAL SALPINGO OOPHORECTOMY;  Surgeon: Alm Cook, MD;  Location: [facility];  Service: Gynecology;  Laterality: Bilateral;   OOPHORECTOMY  age 70   for endometrosis    Family History  Problem Relation Age of Onset   Liver cancer Mother    Kidney cancer Mother    Lung cancer Mother    Heart disease Father    Hypertension Father    Cirrhosis Father    Macular degeneration Father    Diabetes Brother    Heart attack Brother    Breast cancer Maternal Grandmother    Stroke Maternal Grandmother    Colon cancer Neg Hx    Esophageal cancer Neg Hx    Stomach cancer Neg Hx      Social Connections: Unknown (10/03/2023)   Social Connection and Isolation Panel    Frequency of Communication with Friends and Family: Three times a week    Frequency of Social Gatherings with Friends and Family: Three times a week    Attends Religious Services: More than 4 times per year  Active Member of Clubs or Organizations: Yes    Attends Banker Meetings: More than 4 times per year    Marital Status: Patient declined      Current Outpatient Medications:    acetaminophen  (TYLENOL ) 500 MG tablet, Take 1,000 mg by mouth 2 (two) times daily as needed for moderate pain or headache. , Disp: , Rfl:    ascorbic acid (VITAMIN C) 1000 MG tablet, Take 1,000 mg by mouth daily., Disp: , Rfl:    atenolol  (TENORMIN ) 25 MG tablet, Take 25 mg by mouth daily., Disp: , Rfl:    Calcium  Carb-Cholecalciferol 500-600 MG-UNIT TABS, Take 1 tablet by mouth daily., Disp: , Rfl:    DULoxetine  (CYMBALTA ) 20 MG capsule, Take 2 capsules (40 mg total) by mouth daily., Disp: 180 capsule, Rfl: 0   gabapentin  (NEURONTIN ) 100 MG  capsule, Take 2 capsules (200 mg total) by mouth at bedtime. (Patient taking differently: Take 100 mg by mouth at bedtime.), Disp: 120 capsule, Rfl: 0   hydrochlorothiazide  (MICROZIDE ) 12.5 MG capsule, Take 12.5 mg by mouth as needed (fluid)., Disp: , Rfl:    levothyroxine  (SYNTHROID , LEVOTHROID) 75 MCG tablet, Take 75 mcg by mouth at bedtime. , Disp: , Rfl:    Omega-3 Fatty Acids (FISH OIL) 1000 MG CAPS, Take 1,000 mg by mouth daily., Disp: , Rfl:    omeprazole  (PRILOSEC) 20 MG capsule, Take 1 capsule (20 mg total) by mouth 2 (two) times daily., Disp: 30 capsule, Rfl: 0   ondansetron  (ZOFRAN -ODT) 4 MG disintegrating tablet, Take 1 tablet (4 mg total) by mouth every 8 (eight) hours as needed for nausea or vomiting., Disp: 20 tablet, Rfl: 0   tiZANidine  (ZANAFLEX ) 2 MG tablet, Take 1 tablet (2 mg total) by mouth at bedtime. (Patient taking differently: Take 2 mg by mouth as needed for muscle spasms.), Disp: 30 tablet, Rfl: 0   vitamin B-12 (CYANOCOBALAMIN ) 1000 MCG tablet, Take 1,000 mcg by mouth daily., Disp: , Rfl:    vitamin k  100 MCG tablet, Take 1 tablet (100 mcg total) by mouth daily., Disp: 3 tablet, Rfl: 0   Vitamins A & D (VITAMIN A & D) 5000-400 UNITS CAPS, Take 1 capsule by mouth daily., Disp: , Rfl:    Physical Exam:   BP 111/77   Pulse 86   SpO2 95%   Pertinent Findings  CN II-XII intact Bilateral EACs are clear, TM intact, TMs unable to visualize through to middle ear space no obvious effusion Weber 512: equal Rinne 512: AC > BC b/l  Anterior rhinoscopy: Septum midline; bilateral inferior turbinates with mild hypertrophy No lesions of oral cavity/oropharynx; dentition in normal limits No obviously palpable neck masses/lymphadenopathy/thyromegaly No respiratory distress or stridor  Seprately Identifiable Procedures:  None  Impression & Plans:  Abigail Robinson is a 73 y.o. female with the following   Middle ear effusion-  Follow-up evaluation for left-sided middle ear  effusion.  We are unable to obtain the audiogram.  She notes the hearing has improved in the left ear but not back to baseline.  I would recommend the repeat audiogram and office visit at that time we will discuss plan moving forward.  The patient does not want tympanostomy tubes, we will review this at her next office visit.  She was given strict return precautions.  She verbalized understanding and agreement to today's plan.   - f/u in office with audiological evaluation when available   Thank you for allowing me the opportunity to care for your patient.  Please do not hesitate to contact me should you have any other questions.  Sincerely, Chyrl Cohen PA-C [facility] Phone: [phone] Fax: [phone]  11/01/2023, 3:07 PM

## 2023-11-02 ENCOUNTER — Other Ambulatory Visit: Payer: Self-pay

## 2023-11-02 ENCOUNTER — Other Ambulatory Visit (INDEPENDENT_AMBULATORY_CARE_PROVIDER_SITE_OTHER)

## 2023-11-02 DIAGNOSIS — K851 Biliary acute pancreatitis without necrosis or infection: Secondary | ICD-10-CM

## 2023-11-02 DIAGNOSIS — R109 Unspecified abdominal pain: Secondary | ICD-10-CM | POA: Diagnosis not present

## 2023-11-02 DIAGNOSIS — K862 Cyst of pancreas: Secondary | ICD-10-CM | POA: Diagnosis not present

## 2023-11-02 DIAGNOSIS — I10 Essential (primary) hypertension: Secondary | ICD-10-CM

## 2023-11-02 LAB — CBC WITH DIFFERENTIAL/PLATELET
Basophils Absolute: 0.1 K/uL (ref 0.0–0.1)
Basophils Relative: 0.6 % (ref 0.0–3.0)
Eosinophils Absolute: 0.7 K/uL (ref 0.0–0.7)
Eosinophils Relative: 6.6 % — ABNORMAL HIGH (ref 0.0–5.0)
HCT: 37.2 % (ref 36.0–46.0)
Hemoglobin: 12.3 g/dL (ref 12.0–15.0)
Lymphocytes Relative: 22.5 % (ref 12.0–46.0)
Lymphs Abs: 2.4 K/uL (ref 0.7–4.0)
MCHC: 33.1 g/dL (ref 30.0–36.0)
MCV: 90.7 fl (ref 78.0–100.0)
Monocytes Absolute: 0.7 K/uL (ref 0.1–1.0)
Monocytes Relative: 6.4 % (ref 3.0–12.0)
Neutro Abs: 6.7 K/uL (ref 1.4–7.7)
Neutrophils Relative %: 63.9 % (ref 43.0–77.0)
Platelets: 391 K/uL (ref 150.0–400.0)
RBC: 4.11 Mil/uL (ref 3.87–5.11)
RDW: 14 % (ref 11.5–15.5)
WBC: 10.6 K/uL — ABNORMAL HIGH (ref 4.0–10.5)

## 2023-11-02 LAB — COMPREHENSIVE METABOLIC PANEL WITH GFR
ALT: 8 U/L (ref 0–35)
AST: 17 U/L (ref 0–37)
Albumin: 3.7 g/dL (ref 3.5–5.2)
Alkaline Phosphatase: 72 U/L (ref 39–117)
BUN: 10 mg/dL (ref 6–23)
CO2: 28 meq/L (ref 19–32)
Calcium: 9.3 mg/dL (ref 8.4–10.5)
Chloride: 98 meq/L (ref 96–112)
Creatinine, Ser: 0.65 mg/dL (ref 0.40–1.20)
GFR: 86.65 mL/min (ref >60.00)
Glucose, Bld: 94 mg/dL (ref 70–99)
Potassium: 3.4 meq/L — ABNORMAL LOW (ref 3.5–5.1)
Sodium: 136 meq/L (ref 135–145)
Total Bilirubin: 0.3 mg/dL (ref 0.2–1.2)
Total Protein: 7.3 g/dL (ref 6.0–8.3)

## 2023-11-02 LAB — HIGH SENSITIVITY CRP: CRP, High Sensitivity: 32.65 mg/L — ABNORMAL HIGH (ref 0.000–5.000)

## 2023-11-02 LAB — LIPASE: Lipase: 126 U/L — ABNORMAL HIGH (ref 11.0–59.0)

## 2023-11-02 LAB — SEDIMENTATION RATE: Sed Rate: 83 mm/h — ABNORMAL HIGH (ref 0–30)

## 2023-11-02 LAB — AMYLASE: Amylase: 57 U/L (ref 27–131)

## 2023-11-03 ENCOUNTER — Ambulatory Visit: Payer: Self-pay | Admitting: Gastroenterology

## 2023-11-06 MED ORDER — POTASSIUM CHLORIDE CRYS ER 20 MEQ PO TBCR: 20.0000 meq | EXTENDED_RELEASE_TABLET | Freq: Every day | ORAL | 0 refills | Status: AC

## 2023-11-09 ENCOUNTER — Ambulatory Visit (INDEPENDENT_AMBULATORY_CARE_PROVIDER_SITE_OTHER): Admitting: Family

## 2023-11-09 ENCOUNTER — Encounter: Payer: Self-pay | Admitting: Family

## 2023-11-09 ENCOUNTER — Ambulatory Visit (HOSPITAL_BASED_OUTPATIENT_CLINIC_OR_DEPARTMENT_OTHER)
Admission: RE | Admit: 2023-11-09 | Discharge: 2023-11-09 | Disposition: A | Discharge status: Home / Self Care | Source: Ambulatory Visit | Attending: Family | Admitting: Family

## 2023-11-09 ENCOUNTER — Ambulatory Visit: Admitting: Gastroenterology

## 2023-11-09 VITALS — BP 112/72 | HR 63 | Ht 67.0 in | Wt 158.8 lb

## 2023-11-09 DIAGNOSIS — R0602 Shortness of breath: Secondary | ICD-10-CM

## 2023-11-09 DIAGNOSIS — E876 Hypokalemia: Secondary | ICD-10-CM | POA: Diagnosis not present

## 2023-11-09 DIAGNOSIS — R000 Tachycardia, unspecified: Secondary | ICD-10-CM

## 2023-11-09 MED ORDER — HYDROCHLOROTHIAZIDE 12.5 MG PO CAPS: 12.5000 mg | ORAL_CAPSULE | Freq: Every day | ORAL | Status: AC

## 2023-11-09 NOTE — Progress Notes (Signed)
 Abigail Robinson is a 73 y.o. female with the following history as recorded in Epic[employer]:  Patient Active Problem List   Diagnosis Date Noted   Gallstone pancreatitis 10/10/2023   Acute pancreatitis 10/03/2023   Benign paroxysmal positional vertigo 10/03/2023   Hyperlipidemia 10/03/2023   Stage 2 chronic kidney disease 10/03/2023   Hypertensive chronic kidney disease with stage 1 through stage 4 chronic kidney disease, or unspecified chronic kidney disease 10/03/2023   Peripheral neuropathy 10/03/2023   Hyponatremia 10/03/2023   Hypokalemia 10/03/2023   Greater trochanteric bursitis of right hip 11/14/2022   Arthritis of carpometacarpal [facility]) joint of left thumb 11/14/2022   Patellofemoral arthritis of right knee 09/18/2022   Rotator cuff arthropathy of left shoulder 05/18/2022   AC (acromioclavicular) arthritis 05/18/2022   Double vision 01/04/2022   Degenerative disc disease, lumbar 08/31/2020   Degenerative cervical disc 02/16/2020   S/P laparoscopic assisted vaginal hysterectomy (LAVH) 07/08/2015   Gastro-esophageal reflux disease without esophagitis 05/13/2015   Hypothyroidism 05/13/2015    Current Outpatient Medications  Medication Sig Dispense Refill   acetaminophen  (TYLENOL ) 500 MG tablet Take 1,000 mg by mouth 2 (two) times daily as needed for moderate pain or headache.      ascorbic acid (VITAMIN C) 1000 MG tablet Take 1,000 mg by mouth daily.     atenolol  (TENORMIN ) 25 MG tablet Take 25 mg by mouth daily.     Calcium  Carb-Cholecalciferol 500-600 MG-UNIT TABS Take 1 tablet by mouth daily.     DULoxetine  (CYMBALTA ) 20 MG capsule Take 2 capsules (40 mg total) by mouth daily. 180 capsule 0   gabapentin  (NEURONTIN ) 100 MG capsule Take 2 capsules (200 mg total) by mouth at bedtime. (Patient taking differently: Take 100 mg by mouth at bedtime.) 120 capsule 0   levothyroxine  (SYNTHROID ) 75 MCG tablet Take 75 mcg by mouth daily before breakfast.     Omega-3 Fatty Acids (FISH OIL)  1000 MG CAPS Take 1,000 mg by mouth daily.     omeprazole  (PRILOSEC) 20 MG capsule Take 1 capsule (20 mg total) by mouth 2 (two) times daily. 30 capsule 0   potassium chloride  SA (KLOR-CON  M) 20 MEQ tablet Take 1 tablet (20 mEq total) by mouth daily. 10 tablet 0   tiZANidine  (ZANAFLEX ) 2 MG tablet Take 1 tablet (2 mg total) by mouth at bedtime. (Patient taking differently: Take 2 mg by mouth as needed for muscle spasms.) 30 tablet 0   vitamin B-12 (CYANOCOBALAMIN ) 1000 MCG tablet Take 1,000 mcg by mouth daily.     vitamin k  100 MCG tablet Take 1 tablet (100 mcg total) by mouth daily. 3 tablet 0   Vitamins A & D (VITAMIN A & D) 5000-400 UNITS CAPS Take 1 capsule by mouth daily.     hydrochlorothiazide  (MICROZIDE ) 12.5 MG capsule Take 1 capsule (12.5 mg total) by mouth daily.     No current facility-administered medications for this visit.    Allergies: Demerol [meperidine] and Gadolinium  Past Medical History:  Diagnosis Date   Allergy    Arthritis    back, hands   Basal cell carcinoma    face   Colon polyps    Complication of anesthesia    prolonged sedation, slow to wake up   Diplopia    Endometriosis    GERD (gastroesophageal reflux disease)    Headache    Migraines   Hyperlipidemia    Hypertension    Hypothyroidism    Pancreatitis    Skin cancer  hx of   Status post dilation of esophageal narrowing    Thalamic cyst    Thyroid  disease    Vertigo     Past Surgical History:  Procedure Laterality Date   ANTERIOR AND POSTERIOR REPAIR WITH SACROSPINOUS FIXATION N/A 07/08/2015   Procedure: ANTERIOR AND POSTERIOR REPAIR ;  Surgeon: Alm Cook, MD;  Location: [facility];  Service: Gynecology;  Laterality: N/A;   COLONOSCOPY     DIAGNOSTIC LAPAROSCOPY     LAPAROSCOPIC VAGINAL HYSTERECTOMY WITH SALPINGO OOPHORECTOMY Bilateral 07/08/2015   Procedure: LAPAROSCOPIC ASSISTED VAGINAL HYSTERECTOMY WITH BILATERAL SALPINGO OOPHORECTOMY;  Surgeon: Alm Cook, MD;  Location: [facility];   Service: Gynecology;  Laterality: Bilateral;   OOPHORECTOMY  age 3   for endometrosis    Family History  Problem Relation Age of Onset   Liver cancer Mother    Kidney cancer Mother    Lung cancer Mother    Heart disease Father    Hypertension Father    Cirrhosis Father    Macular degeneration Father    Diabetes Brother    Heart attack Brother    Breast cancer Maternal Grandmother    Stroke Maternal Grandmother    Colon cancer Neg Hx    Esophageal cancer Neg Hx    Stomach cancer Neg Hx     Social History   Tobacco Use   Smoking status: Never   Smokeless tobacco: Never  Substance Use Topics   Alcohol  use: Not Currently    Subjective:   Patient is here today as a new patient- she was notified prior to her appointment that I will be leaving and she will need to find a new PCP.  She notes that she had a hot flash all last week and felt her heart rate went up; her symptoms stopped last Sunday; she notes she still feels a little short of breath. She had labs done with her GI while these symptoms were going on and was found to have low potassium;   Objective:  Vitals:   11/09/23 1406  BP: 112/72  Pulse: 63  SpO2: 96%  Weight: 158 lb 12.8 oz (72 kg)  Height: 5' 7 (1.702 m)    General: Well developed, well nourished, in no acute distress  Skin : Warm and dry.  Head: Normocephalic and atraumatic  Eyes: Sclera and conjunctiva clear; pupils round and reactive to light; extraocular movements intact  Ears: External normal; canals clear; tympanic membranes normal  Oropharynx: Pink, supple. No suspicious lesions  Neck: Supple without thyromegaly, adenopathy  Lungs: Respirations unlabored; clear to auscultation bilaterally without wheeze, rales, rhonchi  CVS exam: normal rate and regular rhythm.  Neurologic: Alert and oriented; speech intact; face symmetrical; moves all extremities well; CNII-XII intact without focal deficit   Assessment:  1. SOB (shortness of breath)    2. Tachycardia   3. Hypokalemia     Plan:   ? Etiology- concern for episode of A. Fib based on patient's description of symptoms; rate and rhythm are normal today; EKG shows sinus rhythm; will refer to cardiology for further evaluation; will also update CXR; She is encouraged to follow up with her surgeon for a post-op check- she misunderstood these instructions on her discharge summary;  She is also aware that I will be leaving this practice next month and she will need to get established with a new PCP;   No follow-ups on file.  Orders Placed This Encounter  Procedures   DG Chest 2 View    Standing  Status:   Future    Number of Occurrences:   1    Expiration Date:   11/08/2024    Reason for Exam (SYMPTOM  OR DIAGNOSIS REQUIRED):   shortness of breath    Preferred imaging location?:   [facility]   Comp Met ([facility])   Ambulatory referral to Cardiology    Referral Priority:   Routine    Referral Type:   Consultation    Referral Reason:   Specialty Services Required    Number of Visits Requested:   1   EKG 12-Lead    Requested Prescriptions   Signed Prescriptions Disp Refills   hydrochlorothiazide  (MICROZIDE ) 12.5 MG capsule      Sig: Take 1 capsule (12.5 mg total) by mouth daily.

## 2023-11-09 NOTE — Patient Instructions (Addendum)
 Sebastian Moles, MD. Call in 1  week(s).   Specialty: General Surgery Contact information: [address] [place] [address] [phone]   Let them know you are needing to do a post-op from your hospital stay;

## 2023-11-10 LAB — COMPREHENSIVE METABOLIC PANEL WITH GFR
AG Ratio: 1.2 (calc) (ref 1.0–2.5)
ALT: 8 U/L (ref 6–29)
AST: 19 U/L (ref 10–35)
Albumin: 3.9 g/dL (ref 3.6–5.1)
Alkaline phosphatase (APISO): 69 U/L (ref 37–153)
BUN: 7 mg/dL (ref 7–25)
CO2: 28 mmol/L (ref 20–32)
Calcium: 9.9 mg/dL (ref 8.6–10.4)
Chloride: 102 mmol/L (ref 98–110)
Creat: 0.72 mg/dL (ref 0.60–1.00)
Globulin: 3.3 g/dL (ref 1.9–3.7)
Glucose, Bld: 101 mg/dL — ABNORMAL HIGH (ref 65–99)
Potassium: 4.2 mmol/L (ref 3.5–5.3)
Sodium: 139 mmol/L (ref 135–146)
Total Bilirubin: 0.3 mg/dL (ref 0.2–1.2)
Total Protein: 7.2 g/dL (ref 6.1–8.1)
eGFR: 88 mL/min/1.73m2 (ref >=60)

## 2023-11-12 ENCOUNTER — Ambulatory Visit (HOSPITAL_COMMUNITY)
Admission: RE | Admit: 2023-11-12 | Discharge: 2023-11-12 | Disposition: A | Discharge status: Home / Self Care | Source: Ambulatory Visit | Attending: Gastroenterology | Admitting: Gastroenterology

## 2023-11-12 DIAGNOSIS — R109 Unspecified abdominal pain: Secondary | ICD-10-CM | POA: Insufficient documentation

## 2023-11-12 DIAGNOSIS — K760 Fatty (change of) liver, not elsewhere classified: Secondary | ICD-10-CM | POA: Diagnosis not present

## 2023-11-12 DIAGNOSIS — K851 Biliary acute pancreatitis without necrosis or infection: Secondary | ICD-10-CM | POA: Insufficient documentation

## 2023-11-12 DIAGNOSIS — K862 Cyst of pancreas: Secondary | ICD-10-CM | POA: Diagnosis not present

## 2023-11-12 DIAGNOSIS — K863 Pseudocyst of pancreas: Secondary | ICD-10-CM | POA: Diagnosis not present

## 2023-11-12 MED ORDER — IOHEXOL 300 MG/ML  SOLN
100.0000 mL | Freq: Once | INTRAMUSCULAR | Status: AC | PRN
  Administered 2023-11-12: 100 mL via INTRAVENOUS

## 2023-11-13 ENCOUNTER — Ambulatory Visit: Payer: Self-pay | Admitting: Family

## 2023-11-14 ENCOUNTER — Encounter: Payer: Self-pay | Admitting: Cardiovascular Disease

## 2023-11-14 ENCOUNTER — Ambulatory Visit: Attending: Cardiovascular Disease | Admitting: Cardiovascular Disease

## 2023-11-14 ENCOUNTER — Ambulatory Visit (INDEPENDENT_AMBULATORY_CARE_PROVIDER_SITE_OTHER)

## 2023-11-14 VITALS — BP 110/70 | HR 73 | Ht 67.0 in | Wt 160.0 lb

## 2023-11-14 DIAGNOSIS — I10 Essential (primary) hypertension: Secondary | ICD-10-CM | POA: Insufficient documentation

## 2023-11-14 DIAGNOSIS — R002 Palpitations: Secondary | ICD-10-CM | POA: Insufficient documentation

## 2023-11-14 DIAGNOSIS — E782 Mixed hyperlipidemia: Secondary | ICD-10-CM | POA: Diagnosis not present

## 2023-11-14 NOTE — Assessment & Plan Note (Signed)
 History of essential hypertension with blood pressure measured today at 110/70.  She is on low-dose atenolol  and hydrochlorothiazide .

## 2023-11-14 NOTE — Progress Notes (Signed)
 11/14/2023 [name]   1950-04-10  [id number]  Primary Physician Jason Leita Repine, FNP Primary Cardiologist: Dorn JINNY Lesches MD FACP, [place] [place] [facility] [place], FSCAI  HPI:  Abigail Robinson is a 73 y.o. thin-appearing widowed Caucasian female with no children who has a fiancs and is intending to get married this November.  She is referred by Karilyn Ronal Repine, FNP because of tachypalpitations.  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  She has had a stress echo in the past 06/09/2020 that was negative with normal EF.  Her risk factors include treated hypertension and untreated hyperlipidemia because of statin intolerance.  She apparently had a stress echo performed 06/09/2020 which was negative for ischemia with normal EF.  She fell off a tractor several months ago and has had pancreatitis as a result since that time.  She has had exertional tachypalpitations over several weeks.  The last of which was 2 weeks ago.  Otherwise she denies chest pain or shortness of breath.   Current Meds  Medication Sig   acetaminophen  (TYLENOL ) 500 MG tablet Take 1,000 mg by mouth 2 (two) times daily as needed for moderate pain or headache.    ascorbic acid (VITAMIN C) 1000 MG tablet Take 1,000 mg by mouth daily.   atenolol  (TENORMIN ) 25 MG tablet Take 25 mg by mouth daily.   Calcium  Carb-Cholecalciferol 500-600 MG-UNIT TABS Take 1 tablet by mouth daily.   DULoxetine  (CYMBALTA ) 20 MG capsule Take 2 capsules (40 mg total) by mouth daily.   gabapentin  (NEURONTIN ) 100 MG capsule Take 2 capsules (200 mg total) by mouth at bedtime. (Patient taking differently: Take 100 mg by mouth at bedtime.)   hydrochlorothiazide  (MICROZIDE ) 12.5 MG capsule Take 1 capsule (12.5 mg total) by mouth daily.   levothyroxine  (SYNTHROID ) 75 MCG tablet Take 75 mcg by mouth daily before breakfast.   Omega-3 Fatty Acids (FISH OIL) 1000 MG CAPS Take 1,000 mg by mouth daily.   omeprazole  (PRILOSEC) 20 MG capsule Take 1 capsule (20 mg  total) by mouth 2 (two) times daily.   potassium chloride  SA (KLOR-CON  M) 20 MEQ tablet Take 1 tablet (20 mEq total) by mouth daily.   tiZANidine  (ZANAFLEX ) 2 MG tablet Take 1 tablet (2 mg total) by mouth at bedtime. (Patient taking differently: Take 2 mg by mouth as needed for muscle spasms.)   vitamin B-12 (CYANOCOBALAMIN ) 1000 MCG tablet Take 1,000 mcg by mouth daily.   vitamin k  100 MCG tablet Take 1 tablet (100 mcg total) by mouth daily.   Vitamins A & D (VITAMIN A & D) 5000-400 UNITS CAPS Take 1 capsule by mouth daily.     Allergies  Allergen Reactions   Demerol [Meperidine] Nausea Only and Other (See Comments)    Reaction=headache   Gadolinium Dermatitis    Social History   Socioeconomic History   Marital status: Widowed    Spouse name: Not on file   Number of children: 0   Years of education: Not on file   Highest education level: Bachelor's degree (e.g., BA, AB, BS)  Occupational History    Comment: retired  Tobacco Use   Smoking status: Never   Smokeless tobacco: Never  Vaping Use   Vaping status: Never Used  Substance and Sexual Activity   Alcohol  use: Not Currently   Drug use: No   Sexual activity: Not Currently  Other Topics Concern   Not on file  Social History Narrative   Lives alone   Social  Drivers of Corporate investment banker Strain: Not on file  Food Insecurity: No Food Insecurity (10/03/2023)   Hunger Vital Sign    Worried About Running Out of Food in the Last Year: Never true    Ran Out of Food in the Last Year: Never true  Transportation Needs: No Transportation Needs (10/03/2023)   PRAPARE - Administrator, Civil Service (Medical): No    Lack of Transportation (Non-Medical): No  Physical Activity: Not on file  Stress: Not on file  Social Connections: Unknown (10/03/2023)   Social Connection and Isolation Panel    Frequency of Communication with Friends and Family: Three times a week    Frequency of Social Gatherings with Friends  and Family: Three times a week    Attends Religious Services: More than 4 times per year    Active Member of Clubs or Organizations: Yes    Attends Banker Meetings: More than 4 times per year    Marital Status: Patient declined  Intimate Partner Violence: Not At Risk (10/03/2023)   Humiliation, Afraid, Rape, and Kick questionnaire    Fear of Current or Ex-Partner: No    Emotionally Abused: No    Physically Abused: No    Sexually Abused: No     Review of Systems: General: negative for chills, fever, night sweats or weight changes.  Cardiovascular: negative for chest pain, dyspnea on exertion, edema, orthopnea, palpitations, paroxysmal nocturnal dyspnea or shortness of breath Dermatological: negative for rash Respiratory: negative for cough or wheezing Urologic: negative for hematuria Abdominal: negative for nausea, vomiting, diarrhea, bright red blood per rectum, melena, or hematemesis Neurologic: negative for visual changes, syncope, or dizziness All other systems reviewed and are otherwise negative except as noted above.    Blood pressure 110/70, pulse 73, height 5' 7 (1.702 m), weight 160 lb (72.6 kg), SpO2 96%.  General appearance: alert and no distress Neck: no adenopathy, no carotid bruit, no JVD, supple, symmetrical, trachea midline, and thyroid  not enlarged, symmetric, no tenderness/mass/nodules Lungs: clear to auscultation bilaterally Heart: regular rate and rhythm, S1, S2 normal, no murmur, click, rub or gallop Extremities: extremities normal, atraumatic, no cyanosis or edema Pulses: 2+ and symmetric Skin: Skin color, texture, turgor normal. No rashes or lesions Neurologic: Grossly normal  EKG not performed today      ASSESSMENT AND PLAN:   Hyperlipidemia History of hyperlipidemia intolerant to statin therapy lipid profile performed 08/21/2023 revealed a total cholesterol 218, LDL 137 and HDL of 46  Palpitations History of palpitations that occurred  over several week.  The last episode of which was 2 weeks ago.  This has occurred since she is has had pancreatitis related to trauma.  The palpitations occurred mostly with exertion and resolves with rest.  I am going to get a 2-week Zio patch to further evaluate.  Essential hypertension History of essential hypertension with blood pressure measured today at 110/70.  She is on low-dose atenolol  and hydrochlorothiazide .     Dorn DOROTHA Lesches MD [facility], [facility] 11/14/2023 2:35 PM

## 2023-11-14 NOTE — Patient Instructions (Signed)
 Medication Instructions:  Your physician recommends that you continue on your current medications as directed. Please refer to the Current Medication list given to you today.  *If you need a refill on your cardiac medications before your next appointment, please call your pharmacy*  Testing/Procedures: ZIO XT- Long Term Monitor Instructions  Your physician has requested you wear a ZIO patch monitor for 14 days.  This is a single patch monitor. [employer] supplies one patch monitor per enrollment. Additional stickers are not available. Please do not apply patch if you will be having a Nuclear Stress Test,  Echocardiogram, Cardiac CT, MRI, or Chest Xray during the period you would be wearing the  monitor. The patch cannot be worn during these tests. You cannot remove and re-apply the  ZIO XT patch monitor.  Your ZIO patch monitor will be mailed 3 day USPS to your address on file. It may take 3-5 days  to receive your monitor after you have been enrolled.  Once you have received your monitor, please review the enclosed instructions. Your monitor  has already been registered assigning a specific monitor serial # to you.  Billing and Patient Assistance Program Information  We have supplied [employer] with any of your insurance information on file for billing purposes. [employer] offers a sliding scale Patient Assistance Program for patients that do not have  insurance, or whose insurance does not completely cover the cost of the ZIO monitor.  You must apply for the Patient Assistance Program to qualify for this discounted rate.  To apply, please call [employer] at [phone], select option 4, select option 2, ask to apply for  Patient Assistance Program. Meredeth will ask your household income, and how many people  are in your household. They will quote your out-of-pocket cost based on that information.  [employer] will also be able to set up a 44-month, interest-free payment plan if needed.  Applying the  monitor   Shave hair from upper left chest.  Hold abrader disc by orange tab. Rub abrader in 40 strokes over the upper left chest as  indicated in your monitor instructions.  Clean area with 4 enclosed alcohol  pads. Let dry.  Apply patch as indicated in monitor instructions. Patch will be placed under collarbone on left  side of chest with arrow pointing upward.  Rub patch adhesive wings for 2 minutes. Remove white label marked 1. Remove the white  label marked 2. Rub patch adhesive wings for 2 additional minutes.  While looking in a mirror, press and release button in center of patch. A small green light will  flash 3-4 times. This will be your only indicator that the monitor has been turned on.  Do not shower for the first 24 hours. You may shower after the first 24 hours.  Press the button if you feel a symptom. You will hear a small click. Record Date, Time and  Symptom in the Patient Logbook.  When you are ready to remove the patch, follow instructions on the last 2 pages of Patient  Logbook. Stick patch monitor onto the last page of Patient Logbook.  Place Patient Logbook in the blue and white box. Use locking tab on box and tape box closed  securely. The blue and white box has prepaid postage on it. Please place it in the mailbox as  soon as possible. Your physician should have your test results approximately 7 days after the  monitor has been mailed back to [facility].  Call [facility] Customer Care at  [phone] if you have questions regarding  your ZIO XT patch monitor. Call them immediately if you see an orange light blinking on your  monitor.  If your monitor falls off in less than 4 days, contact our Monitor department at [phone].  If your monitor becomes loose or falls off after 4 days call [employer] at [phone] for  suggestions on securing your monitor   Follow-Up: At [facility], you and your health needs are our priority.  As part of  our continuing mission to provide you with exceptional heart care, our providers are all part of one team.  This team includes your primary Cardiologist (physician) and Advanced Practice Providers or APPs (Physician Assistants and Nurse Practitioners) who all work together to provide you with the care you need, when you need it.  Your next appointment:   We will see you on an as needed basis   Provider:   Dorn Lesches, MD

## 2023-11-14 NOTE — Assessment & Plan Note (Signed)
 History of hyperlipidemia intolerant to statin therapy lipid profile performed 08/21/2023 revealed a total cholesterol 218, LDL 137 and HDL of 46

## 2023-11-14 NOTE — Progress Notes (Unsigned)
 Enrolled patient for a 14 day Zio XT  monitor to be mailed to patients home

## 2023-11-14 NOTE — Assessment & Plan Note (Signed)
 History of palpitations that occurred over several week.  The last episode of which was 2 weeks ago.  This has occurred since she is has had pancreatitis related to trauma.  The palpitations occurred mostly with exertion and resolves with rest.  I am going to get a 2-week Zio patch to further evaluate.

## 2023-11-16 ENCOUNTER — Ambulatory Visit (HOSPITAL_COMMUNITY)

## 2023-11-19 DIAGNOSIS — H00011 Hordeolum externum right upper eyelid: Secondary | ICD-10-CM | POA: Diagnosis not present

## 2023-11-20 DIAGNOSIS — K863 Pseudocyst of pancreas: Secondary | ICD-10-CM | POA: Diagnosis not present

## 2023-11-23 ENCOUNTER — Ambulatory Visit (INDEPENDENT_AMBULATORY_CARE_PROVIDER_SITE_OTHER): Admitting: Physician Assistant

## 2023-11-23 ENCOUNTER — Encounter (INDEPENDENT_AMBULATORY_CARE_PROVIDER_SITE_OTHER): Payer: Self-pay | Admitting: Physician Assistant

## 2023-11-23 ENCOUNTER — Ambulatory Visit (INDEPENDENT_AMBULATORY_CARE_PROVIDER_SITE_OTHER): Admitting: Audiology

## 2023-11-23 VITALS — BP 90/65 | HR 73

## 2023-11-23 DIAGNOSIS — Z8669 Personal history of other diseases of the nervous system and sense organs: Secondary | ICD-10-CM

## 2023-11-23 DIAGNOSIS — Z09 Encounter for follow-up examination after completed treatment for conditions other than malignant neoplasm: Secondary | ICD-10-CM

## 2023-11-23 DIAGNOSIS — H903 Sensorineural hearing loss, bilateral: Secondary | ICD-10-CM

## 2023-11-23 DIAGNOSIS — H65192 Other acute nonsuppurative otitis media, left ear: Secondary | ICD-10-CM

## 2023-11-23 NOTE — Progress Notes (Signed)
 Dear Dr. Stephane, Here is my assessment for our mutual patient, Abigail Robinson. Thank you for allowing me the opportunity to care for your patient. Please do not hesitate to contact me should you have any other questions. Sincerely, Chyrl Cohen PA-C  Otolaryngology Clinic Note Referring provider: Dr. Stephane HPI:  Abigail Robinson is a 73 y.o. female kindly referred by Dr. Stephane   The patient is a 73 year old female seen in our office for follow-up evaluation of otitis media.  The patient was last seen in the office on 11/01/2023.  Below is a recap of that encounter.  The patient is a 73 year old female seen in our office for follow-up evaluation of otitis media.  The patient was last seen in the office on 08/24/2023.  Below is a recap of that encounter.   Update 11/01/2023.  Since her last office visit she feels like there is still fluid in the left ear, she also notes that there may be some fluid in the right.  She notes it is feeling better noting her hearing has improved by approximately 30%.  She denies any pain, no dizziness, no significant ringing.  She has continued using the Flonase but is using it intermittently along with the antihistamine and saline irrigation.  Original plan was for her to follow-up in our office with repeat audiogram, she did have some issues making it to the office at her scheduled time and that audiogram had to be rescheduled.  Update 11/23/2023.  Since her last office visit she notes that she has been doing well overall, she notes some minimal popping in the left ear, she notes this is worse with going [place], also swallowing helps open the eustachian tube.  She does get some fullness and clogging but does have relief of her symptoms with clearing.  No recurrent otitis media.  She does note that she had an episode of dizziness last week that felt like her previous vertigo with room spinning sensation with significant improvement today.  No abnormal features  for her previous episodes of dizziness.     Independent Review of Additional Tests or Records:  Audiological evaluation on 11/23/2023   Otoscopy: Right ear: Clear external ear canal and notable landmarks visualized on the tympanic membrane. Left ear:  Clear external ear canal and notable landmarks visualized on the tympanic membrane.   Tympanometry: Right ear: Type A- Normal external ear canal volume with normal middle ear pressure and tympanic membrane compliance. Left ear: Type As- Normal external ear canal volume with normal middle ear pressure and reduced tympanic membrane compliance.     Pure tone Audiometry: Right ear- Normal to moderately severe sensorineural hearing loss from  125 Hz - 8000 Hz. Left ear-  Normal to moderately severe sensorineural hearing loss from  125 Hz - 8000 Hz.   Speech Audiometry: Right ear- Speech Reception Threshold (SRT) was obtained at 35 dBHL. Left ear-Speech Reception Threshold (SRT) was obtained at 35 dBHL.   Word Recognition Score Tested using NU-6 (recorded) Right ear: 96% was obtained at a presentation level of 75 dBHL with contralateral masking which is deemed as  excellent. Left ear: 100% was obtained at a presentation level of 75 dBHL with contralateral masking which is deemed as  excellent.   The hearing test results were completed under headphones and results are deemed to be of good reliability. Test technique:  conventional     Impression: Today's hearing test results show symmetric sensorineural hearing loss in both ears.  PMH/Meds/All/SocHx/FamHx/ROS:   Past Medical History:  Diagnosis Date   Allergy    Arthritis    back, hands   Basal cell carcinoma    face   Colon polyps    Complication of anesthesia    prolonged sedation, slow to wake up   Diplopia    Endometriosis    GERD (gastroesophageal reflux disease)    Headache    Migraines   Hyperlipidemia    Hypertension    Hypothyroidism    Pancreatitis     Skin cancer    hx of   Status post dilation of esophageal narrowing    Thalamic cyst    Thyroid  disease    Vertigo      Past Surgical History:  Procedure Laterality Date   ANTERIOR AND POSTERIOR REPAIR WITH SACROSPINOUS FIXATION N/A 07/08/2015   Procedure: ANTERIOR AND POSTERIOR REPAIR ;  Surgeon: Alm Cook, MD;  Location: [facility];  Service: Gynecology;  Laterality: N/A;   COLONOSCOPY     DIAGNOSTIC LAPAROSCOPY     LAPAROSCOPIC VAGINAL HYSTERECTOMY WITH SALPINGO OOPHORECTOMY Bilateral 07/08/2015   Procedure: LAPAROSCOPIC ASSISTED VAGINAL HYSTERECTOMY WITH BILATERAL SALPINGO OOPHORECTOMY;  Surgeon: Alm Cook, MD;  Location: [facility];  Service: Gynecology;  Laterality: Bilateral;   OOPHORECTOMY  age 4   for endometrosis    Family History  Problem Relation Age of Onset   Liver cancer Mother    Kidney cancer Mother    Lung cancer Mother    Heart disease Father    Hypertension Father    Cirrhosis Father    Macular degeneration Father    Diabetes Brother    Heart attack Brother    Breast cancer Maternal Grandmother    Stroke Maternal Grandmother    Colon cancer Neg Hx    Esophageal cancer Neg Hx    Stomach cancer Neg Hx      Social Connections: Unknown (10/03/2023)   Social Connection and Isolation Panel    Frequency of Communication with Friends and Family: Three times a week    Frequency of Social Gatherings with Friends and Family: Three times a week    Attends Religious Services: More than 4 times per year    Active Member of Clubs or Organizations: Yes    Attends Engineer, structural: More than 4 times per year    Marital Status: Patient declined      Current Outpatient Medications:    acetaminophen  (TYLENOL ) 500 MG tablet, Take 1,000 mg by mouth 2 (two) times daily as needed for moderate pain or headache. , Disp: , Rfl:    ascorbic acid (VITAMIN C) 1000 MG tablet, Take 1,000 mg by mouth daily., Disp: , Rfl:    atenolol  (TENORMIN ) 25 MG tablet, Take 25 mg by  mouth daily., Disp: , Rfl:    Calcium  Carb-Cholecalciferol 500-600 MG-UNIT TABS, Take 1 tablet by mouth daily., Disp: , Rfl:    DULoxetine  (CYMBALTA ) 20 MG capsule, Take 2 capsules (40 mg total) by mouth daily., Disp: 180 capsule, Rfl: 0   gabapentin  (NEURONTIN ) 100 MG capsule, Take 2 capsules (200 mg total) by mouth at bedtime. (Patient taking differently: Take 100 mg by mouth at bedtime.), Disp: 120 capsule, Rfl: 0   hydrochlorothiazide  (MICROZIDE ) 12.5 MG capsule, Take 1 capsule (12.5 mg total) by mouth daily., Disp: , Rfl:    levothyroxine  (SYNTHROID ) 75 MCG tablet, Take 75 mcg by mouth daily before breakfast., Disp: , Rfl:    Omega-3 Fatty Acids (FISH OIL) 1000 MG CAPS, Take 1,000  mg by mouth daily., Disp: , Rfl:    omeprazole  (PRILOSEC) 20 MG capsule, Take 1 capsule (20 mg total) by mouth 2 (two) times daily., Disp: 30 capsule, Rfl: 0   potassium chloride  SA (KLOR-CON  M) 20 MEQ tablet, Take 1 tablet (20 mEq total) by mouth daily., Disp: 10 tablet, Rfl: 0   tiZANidine  (ZANAFLEX ) 2 MG tablet, Take 1 tablet (2 mg total) by mouth at bedtime. (Patient taking differently: Take 2 mg by mouth as needed for muscle spasms.), Disp: 30 tablet, Rfl: 0   vitamin B-12 (CYANOCOBALAMIN ) 1000 MCG tablet, Take 1,000 mcg by mouth daily., Disp: , Rfl:    vitamin k  100 MCG tablet, Take 1 tablet (100 mcg total) by mouth daily., Disp: 3 tablet, Rfl: 0   Vitamins A & D (VITAMIN A & D) 5000-400 UNITS CAPS, Take 1 capsule by mouth daily., Disp: , Rfl:    Physical Exam:   BP 90/65   Pulse 73   SpO2 91%   Pertinent Findings  CN II-XII intact Bilateral EAC clear and TM intact with well pneumatized middle ear spaces Anterior rhinoscopy: Septum midline; bilateral inferior turbinates with no hypertrophy No lesions of oral cavity/oropharynx; dentition normal limits No obvious neck masses/lymphadenopathy/thyromegaly No respiratory distress or stridor  Seprately Identifiable Procedures:  None  Impression & Plans:   Abigail Robinson is a 73 y.o. female with the following   Otitis media-  No episodes of recurrent otitis media.  She still has some symptoms of eustachian tube dysfunction although she is improving.  Reassuring audiological evaluation today.  At this point the patient may follow-up on a as needed basis if she has any persistent symptoms.  The patient verbalized understanding and agreement to today's plan had no further questions or concerns.   - f/u PRN   Thank you for allowing me the opportunity to care for your patient. Please do not hesitate to contact me should you have any other questions.  Sincerely, Chyrl Cohen PA-C [facility] Phone: [phone] Fax: [phone]  11/23/2023, 9:59 AM

## 2023-11-23 NOTE — Progress Notes (Signed)
  [address], [address] [place], [place] [address] [phone]  Audiological Evaluation    Name: Abigail Robinson     DOB:   Apr 19, 1950      MRN:   [id number]                                                                                     Service Date: 11/23/2023     Accompanied by: unaccompanied   Patient comes today after Reyes Cohen, PA-C sent a referral for a hearing evaluation due to concerns with left ear middle ear effusion.    Symptoms Yes Details  Hearing loss  [x]  Known hearing loss in both ears. Had a left ear mixed hearing loss.  Tinnitus  []    Ear pain/ infections/pressure  [x]  Reports ear popping  Balance problems  []    Noise exposure history  []    Previous ear surgeries  []    Family history of hearing loss  []    Amplification  [x]  Has a set of Widex hearing aids from [facility].  Other  []      Otoscopy: Right ear: Clear external ear canal and notable landmarks visualized on the tympanic membrane. Left ear:  Clear external ear canal and notable landmarks visualized on the tympanic membrane.  Tympanometry: Right ear: Type A- Normal external ear canal volume with normal middle ear pressure and tympanic membrane compliance. Left ear: Type As- Normal external ear canal volume with normal middle ear pressure and reduced tympanic membrane compliance.   Pure tone Audiometry: Right ear- Normal to moderately severe sensorineural hearing loss from  125 Hz - 8000 Hz. Left ear-  Normal to moderately severe sensorineural hearing loss from  125 Hz - 8000 Hz.  Speech Audiometry: Right ear- Speech Reception Threshold (SRT) was obtained at 35 dBHL. Left ear-Speech Reception Threshold (SRT) was obtained at 35 dBHL.   Word Recognition Score Tested using NU-6 (recorded) Right ear: 96% was obtained at a presentation level of 75 dBHL with contralateral masking which is deemed as  excellent. Left ear: 100% was obtained at a presentation level of 75 dBHL with contralateral  masking which is deemed as  excellent.   The hearing test results were completed under headphones and results are deemed to be of good reliability. Test technique:  conventional    Impression: Today's hearing test results show symmetric sensorineural hearing loss in both ears.   Recommendations: Follow up with ENT as scheduled for today. Return for a hearing evaluation if concerns with hearing changes arise or per MD recommendation. Continue hearing aid follow up at [facility].   Courtnay Petrilla MARIE LEROUX-MARTINEZ, AUD

## 2023-11-26 NOTE — Progress Notes (Signed)
 The patient has been notified of this information and all questions answered.  She states she is doing very well at this time- she will keep appt as planned for tomorrow with Harlene

## 2023-11-27 ENCOUNTER — Ambulatory Visit (INDEPENDENT_AMBULATORY_CARE_PROVIDER_SITE_OTHER): Admitting: Gastroenterology

## 2023-11-27 ENCOUNTER — Encounter: Payer: Self-pay | Admitting: Gastroenterology

## 2023-11-27 VITALS — BP 132/70 | HR 70 | Ht 66.0 in | Wt 160.0 lb

## 2023-11-27 DIAGNOSIS — K863 Pseudocyst of pancreas: Secondary | ICD-10-CM | POA: Diagnosis not present

## 2023-11-27 NOTE — Progress Notes (Signed)
 11/27/2023 Abigail Robinson [id number] 1950-04-27   HISTORY OF PRESENT ILLNESS: This is a 73 year old female who is a patient of Dr. Melba.  She has been with acute pancreatitis onset 09/27/2023.  Last seen here for follow-up on 10/10/2023.  She has continued to improve.  Just had a CT scan on 9 08/2023 as follows:  CT scan abdomen and pelvis with contrast:  IMPRESSION: 1. Contiguous multiloculated peripancreatic peripheral enhancing fluid collection measuring 10.7 x 5.3 x 10.0 cm, not substantially changed from prior study on 10/18/2023. 2. Severe narrowing/possible occlusion of the proximal splenic vein (series 5 image 44). This is new compared to 10/18/2023. 3. Decreased size of the left pararenal fluid collection. 4. No evidence of pancreatic necrosis or pancreatic ductal dilation. 5. Hepatic steatosis.  She feels good.  No abdominal pain.  She feels that she does get full quickly, not even able to eat a lot of the time, but she is good with that.  No other symptoms.  Labs all look good to improving significantly.  She saw the surgeon, Dr. Debby, and she does not plan to remove her gallbladder at this time.  She is getting married in November.   Past Medical History:  Diagnosis Date   Allergy    Arthritis    back, hands   Basal cell carcinoma    face   Colon polyps    Complication of anesthesia    prolonged sedation, slow to wake up   Diplopia    Endometriosis    GERD (gastroesophageal reflux disease)    Headache    Migraines   Hyperlipidemia    Hypertension    Hypothyroidism    Pancreatitis    Skin cancer    hx of   Status post dilation of esophageal narrowing    Thalamic cyst    Thyroid  disease    Vertigo    Past Surgical History:  Procedure Laterality Date   ANTERIOR AND POSTERIOR REPAIR WITH SACROSPINOUS FIXATION N/A 07/08/2015   Procedure: ANTERIOR AND POSTERIOR REPAIR ;  Surgeon: Alm Cook, MD;  Location: [facility];  Service: Gynecology;   Laterality: N/A;   COLONOSCOPY     DIAGNOSTIC LAPAROSCOPY     LAPAROSCOPIC VAGINAL HYSTERECTOMY WITH SALPINGO OOPHORECTOMY Bilateral 07/08/2015   Procedure: LAPAROSCOPIC ASSISTED VAGINAL HYSTERECTOMY WITH BILATERAL SALPINGO OOPHORECTOMY;  Surgeon: Alm Cook, MD;  Location: [facility];  Service: Gynecology;  Laterality: Bilateral;   OOPHORECTOMY  age 19   for endometrosis    reports that she has never smoked. She has never used smokeless tobacco. She reports that she does not currently use alcohol . She reports that she does not use drugs. family history includes Breast cancer in her maternal grandmother; Cirrhosis in her father; Diabetes in her brother; Heart attack in her brother; Heart disease in her father; Hypertension in her father; Kidney cancer in her mother; Liver cancer in her mother; Lung cancer in her mother; Macular degeneration in her father; Stroke in her maternal grandmother. Allergies  Allergen Reactions   Demerol [Meperidine] Nausea Only and Other (See Comments)    Reaction=headache   Gadolinium Dermatitis      Outpatient Encounter Medications as of 11/27/2023  Medication Sig   acetaminophen  (TYLENOL ) 500 MG tablet Take 1,000 mg by mouth 2 (two) times daily as needed for moderate pain or headache.    ascorbic acid (VITAMIN C) 1000 MG tablet Take 1,000 mg by mouth daily.   atenolol  (TENORMIN ) 25 MG tablet Take 25 mg by mouth  daily.   Calcium  Carb-Cholecalciferol 500-600 MG-UNIT TABS Take 1 tablet by mouth daily.   DULoxetine  (CYMBALTA ) 20 MG capsule Take 2 capsules (40 mg total) by mouth daily.   gabapentin  (NEURONTIN ) 100 MG capsule Take 2 capsules (200 mg total) by mouth at bedtime. (Patient taking differently: Take 100 mg by mouth at bedtime.)   hydrochlorothiazide  (MICROZIDE ) 12.5 MG capsule Take 1 capsule (12.5 mg total) by mouth daily.   levothyroxine  (SYNTHROID ) 75 MCG tablet Take 75 mcg by mouth daily before breakfast.   Omega-3 Fatty Acids (FISH OIL) 1000 MG CAPS Take  1,000 mg by mouth daily.   omeprazole  (PRILOSEC) 20 MG capsule Take 1 capsule (20 mg total) by mouth 2 (two) times daily.   vitamin B-12 (CYANOCOBALAMIN ) 1000 MCG tablet Take 1,000 mcg by mouth daily.   Vitamins A & D (VITAMIN A & D) 5000-400 UNITS CAPS Take 1 capsule by mouth daily.   potassium chloride  SA (KLOR-CON  M) 20 MEQ tablet Take 1 tablet (20 mEq total) by mouth daily. (Patient not taking: Reported on 11/27/2023)   vitamin k  100 MCG tablet Take 1 tablet (100 mcg total) by mouth daily. (Patient not taking: Reported on 11/27/2023)   [DISCONTINUED] tiZANidine  (ZANAFLEX ) 2 MG tablet Take 1 tablet (2 mg total) by mouth at bedtime. (Patient taking differently: Take 2 mg by mouth as needed for muscle spasms.)   No facility-administered encounter medications on file as of 11/27/2023.    REVIEW OF SYSTEMS  : All other systems reviewed and negative except where noted in the History of Present Illness.   PHYSICAL EXAM: BP 132/70   Pulse 70   Ht 5' 6 (1.676 m) Comment: height without shoes  Wt 160 lb (72.6 kg)   BMI 25.82 kg/m  General: Well developed white female in no acute distress Head: Normocephalic and atraumatic Eyes:  Sclerae anicteric, conjunctiva pink. Ears: Normal auditory acuity Lungs: Clear throughout to auscultation; no W/R/R. Heart: Regular rate and rhythm; no M/R/G. Abdomen: Soft, non-distended.  BS present.  Non-tender. Musculoskeletal: Symmetrical with no gross deformities  Skin: No lesions on visible extremities Extremities: No edema  Neurological: Alert oriented x 4, grossly non-focal Psychological:  Alert and cooperative. Normal mood and affect  ASSESSMENT AND PLAN: 73 year old female admitted with acute pancreatitis onset 09/27/2023, suspected to be biliary in origin.  Now has a pancreatic pseudocyst, but on imaging it is stable.  Patient feels well.  Labs are improved.  She has no pain.  Plan is to repeat a CT scan with contrast again in 4 weeks.  She met with the  surgeon, Dr. Debby, but she does not plan for cholecystectomy at this time.  CC:  Stephane Leita DEL, MD

## 2023-11-27 NOTE — Patient Instructions (Signed)
 You will be contacted by [facility] Scheduling (Your caller ID will indicate phone # [phone]) within the next business 7-10 business days to schedule your CT scan. If you have not heard from them within 7-10 business days, please call [facility] Scheduling at [phone] to follow up on the status of your appointment.     _______________________________________________________  If your blood pressure at your visit was 140/90 or greater, please contact your primary care physician to follow up on this.  _______________________________________________________  If you are age 74 or older, your body mass index should be between 23-30. Your Body mass index is 25.82 kg/m. If this is out of the aforementioned range listed, please consider follow up with your Primary Care Provider.  If you are age 80 or younger, your body mass index should be between 19-25. Your Body mass index is 25.82 kg/m. If this is out of the aformentioned range listed, please consider follow up with your Primary Care Provider.   ________________________________________________________  The [facility] providers would like to encourage you to use MYCHART to communicate with providers for non-urgent requests or questions.  Due to long hold times on the telephone, sending your provider a message by [facility] may be a faster and more efficient way to get a response.  Please allow 48 business hours for a response.  Please remember that this is for non-urgent requests.  _______________________________________________________  [facility] is using a team-based approach to care.  Your team is made up of your doctor and two to three APPS. Our APPS (Nurse Practitioners and Physician Assistants) work with your physician to ensure care continuity for you. They are fully qualified to address your health concerns and develop a treatment plan. They communicate directly with your gastroenterologist to care for  you. Seeing the Advanced Practice Practitioners on your physician's team can help you by facilitating care more promptly, often allowing for earlier appointments, access to diagnostic testing, procedures, and other specialty referrals.

## 2023-11-27 NOTE — Progress Notes (Addendum)
 Attending Physician's Attestation   I have reviewed the chart.   I agree with the Advanced Practitioner's note, impression, and recommendations with any updates as below. Glad to hear that she is doing better.  Happy for her for her upcoming wedding.  Agree with repeat imaging and monitoring.  Hopefully with time this will resolve.  Time will tell.   Aloha Finner, MD [facility] Advanced [facility]oscopy Office # [phone]

## 2023-11-28 ENCOUNTER — Encounter: Payer: Self-pay | Admitting: Family

## 2023-11-28 NOTE — Progress Notes (Unsigned)
 [name] [address] [place] [address] Phone: [phone] Subjective:   Abigail Robinson am a scribe for Dr. Claudene.  I'm seeing this patient by the request  of:  Jason Leita Repine, FNP  CC: Left shoulder pain  YEP:Dlagzrupcz  07/17/2023 I believe the patient is having an exacerbation of her underlying condition.  And seems to have a even more of a whiplash component.  Patient will start formal physical therapy.  Is simply doing a 2-week road trip coming up.  I think that this could be causing some exacerbation as well.  Discussed prednisone  and given for the ride if necessary.  Discussed icing regimen of home exercises, discussed which activities to do and which ones to avoid.  Increase activity slowly otherwise.  Follow-up again in 6 to 8 weeks otherwise.  Laboratory workup noted     Does have rotator cuff arthropathy noted. We will monitor closely. Could do a repeat injection if needed. We discussed icing regimen. Discussed formal physical therapy. Prednisone  given for her trip where patient is going to be doing a car ride for a longer duration. Follow-up with me again in 6 to 8 weeks otherwise. Differential includes degenerative disc disease and I do think patient has more of a whiplash syndrome as well.   Update 11/29/2023 Abigail Robinson is a 73 y.o. female coming in with complaint of neck and L shoulder pain. Saw Dr. Leonce in August for T spine and L side pain after a fall off tractor. Patient states that the left shoulder is terrible today. Left thumb hurting today. It is starting to bad again. Two traumas after falling off tractor. Falling up the steps (reached out with left hand to catch herself) and trying to stop the truck from going forward while still in drive when it wasn't supposed to be (left hand on steering wheel while trying to jump into car to stop it from going forward).  Tylenol  isn't touching the pain anymore.        Past Medical History:  Diagnosis Date   Allergy    Arthritis    back, hands   Basal cell carcinoma    face   Colon polyps    Complication of anesthesia    prolonged sedation, slow to wake up   Diplopia    Endometriosis    GERD (gastroesophageal reflux disease)    Headache    Migraines   Hyperlipidemia    Hypertension    Hypothyroidism    Pancreatitis    Skin cancer    hx of   Status post dilation of esophageal narrowing    Thalamic cyst    Thyroid  disease    Vertigo    Past Surgical History:  Procedure Laterality Date   ANTERIOR AND POSTERIOR REPAIR WITH SACROSPINOUS FIXATION N/A 07/08/2015   Procedure: ANTERIOR AND POSTERIOR REPAIR ;  Surgeon: Alm Cook, MD;  Location: [facility];  Service: Gynecology;  Laterality: N/A;   COLONOSCOPY     DIAGNOSTIC LAPAROSCOPY     LAPAROSCOPIC VAGINAL HYSTERECTOMY WITH SALPINGO OOPHORECTOMY Bilateral 07/08/2015   Procedure: LAPAROSCOPIC ASSISTED VAGINAL HYSTERECTOMY WITH BILATERAL SALPINGO OOPHORECTOMY;  Surgeon: Alm Cook, MD;  Location: [facility];  Service: Gynecology;  Laterality: Bilateral;   OOPHORECTOMY  age 69   for endometrosis   Social History   Socioeconomic History   Marital status: Widowed    Spouse name: Not on file   Number of children: 0   Years of education: Not  on file   Highest education level: Bachelor's degree (e.g., BA, AB, BS)  Occupational History    Comment: retired  Tobacco Use   Smoking status: Never   Smokeless tobacco: Never  Vaping Use   Vaping status: Never Used  Substance and Sexual Activity   Alcohol  use: Not Currently   Drug use: No   Sexual activity: Not Currently  Other Topics Concern   Not on file  Social History Narrative   Lives alone   Social Drivers of Health   Financial Resource Strain: Not on file  Food Insecurity: No Food Insecurity (10/03/2023)   Hunger Vital Sign    Worried About Running Out of Food in the Last Year: Never true    Ran Out of Food in the Last Year: Never  true  Transportation Needs: No Transportation Needs (10/03/2023)   PRAPARE - Administrator, Civil Service (Medical): No    Lack of Transportation (Non-Medical): No  Physical Activity: Not on file  Stress: Not on file  Social Connections: Unknown (10/03/2023)   Social Connection and Isolation Panel    Frequency of Communication with Friends and Family: Three times a week    Frequency of Social Gatherings with Friends and Family: Three times a week    Attends Religious Services: More than 4 times per year    Active Member of Clubs or Organizations: Yes    Attends Engineer, structural: More than 4 times per year    Marital Status: Patient declined   Allergies  Allergen Reactions   Demerol [Meperidine] Nausea Only and Other (See Comments)    Reaction=headache   Gadolinium Dermatitis   Family History  Problem Relation Age of Onset   Liver cancer Mother    Kidney cancer Mother    Lung cancer Mother    Heart disease Father    Hypertension Father    Cirrhosis Father    Macular degeneration Father    Diabetes Brother    Heart attack Brother    Breast cancer Maternal Grandmother    Stroke Maternal Grandmother    Colon cancer Neg Hx    Esophageal cancer Neg Hx    Stomach cancer Neg Hx     Current Outpatient Medications (Endocrine & Metabolic):    levothyroxine  (SYNTHROID ) 75 MCG tablet, Take 75 mcg by mouth daily before breakfast.  Current Outpatient Medications (Cardiovascular):    atenolol  (TENORMIN ) 25 MG tablet, Take 25 mg by mouth daily.   hydrochlorothiazide  (MICROZIDE ) 12.5 MG capsule, Take 1 capsule (12.5 mg total) by mouth daily.   Current Outpatient Medications (Analgesics):    acetaminophen  (TYLENOL ) 500 MG tablet, Take 1,000 mg by mouth 2 (two) times daily as needed for moderate pain or headache.   Current Outpatient Medications (Hematological):    vitamin B-12 (CYANOCOBALAMIN ) 1000 MCG tablet, Take 1,000 mcg by mouth daily.  Current  Outpatient Medications (Other):    ascorbic acid (VITAMIN C) 1000 MG tablet, Take 1,000 mg by mouth daily.   Calcium  Carb-Cholecalciferol 500-600 MG-UNIT TABS, Take 1 tablet by mouth daily.   DULoxetine  (CYMBALTA ) 20 MG capsule, Take 2 capsules (40 mg total) by mouth daily.   gabapentin  (NEURONTIN ) 100 MG capsule, Take 2 capsules (200 mg total) by mouth at bedtime. (Patient taking differently: Take 100 mg by mouth at bedtime.)   Omega-3 Fatty Acids (FISH OIL) 1000 MG CAPS, Take 1,000 mg by mouth daily.   omeprazole  (PRILOSEC) 20 MG capsule, Take 1 capsule (20 mg total) by mouth 2 (  two) times daily.   potassium chloride  SA (KLOR-CON  M) 20 MEQ tablet, Take 1 tablet (20 mEq total) by mouth daily.   vitamin k  100 MCG tablet, Take 1 tablet (100 mcg total) by mouth daily.   Vitamins A & D (VITAMIN A & D) 5000-400 UNITS CAPS, Take 1 capsule by mouth daily.   Reviewed prior external information including notes and imaging from  primary care provider As well as notes that were available from care everywhere and other healthcare systems.  Past medical history, social, surgical and family history all reviewed in electronic medical record.  No pertanent information unless stated regarding to the chief complaint.   Review of Systems:  No headache, visual changes, nausea, vomiting, diarrhea, constipation, dizziness, abdominal pain, skin rash, fevers, chills, night sweats, weight loss, swollen lymph nodes, body aches, joint swelling, chest pain, shortness of breath, mood changes. POSITIVE muscle aches  Objective  Blood pressure 124/70, pulse 64, height 5' 6 (1.676 m), weight 159 lb 6.4 oz (72.3 kg), SpO2 96%.   General: No apparent distress alert and oriented x3 mood and affect normal, dressed appropriately.  HEENT: Pupils equal, extraocular movements intact  Respiratory: Patient's speak in full sentences and does not appear short of breath  Cardiovascular: No lower extremity edema, non tender, no  erythema  Left shoulder exam shows the patient does have positive impingement noted.  Seems to be left greater than right.  Patient does have some limited range of motion noted.  Positive crossover also noted.  Procedure: Real-time Ultrasound Guided Injection of left glenohumeral joint Device: GE Logiq E  Ultrasound guided injection is preferred based studies that show increased duration, increased effect, greater accuracy, decreased procedural pain, increased response rate with ultrasound guided versus blind injection.  Verbal informed consent obtained.  Time-out conducted.  Noted no overlying erythema, induration, or other signs of local infection.  Skin prepped in a sterile fashion.  Local anesthesia: Topical Ethyl chloride.  With sterile technique and under real time ultrasound guidance:  Joint visualized.  21g 2 inch needle inserted posterior approach. Pictures taken for needle placement. Patient did have injection of 2 cc of 0.5% Marcaine , and 1cc of Kenalog 40 mg/dL. Completed without difficulty  Pain immediately resolved suggesting accurate placement of the medication.  Advised to call if fevers/chills, erythema, induration, drainage, or persistent bleeding.  Images permanently stored  Impression: Technically successful ultrasound guided injection.    Impression and Recommendations:    The above documentation has been reviewed and is accurate and complete Zeddie Njie M Joli Koob, DO

## 2023-11-29 ENCOUNTER — Ambulatory Visit (INDEPENDENT_AMBULATORY_CARE_PROVIDER_SITE_OTHER): Admitting: Family Medicine

## 2023-11-29 ENCOUNTER — Other Ambulatory Visit: Payer: Self-pay

## 2023-11-29 ENCOUNTER — Ambulatory Visit (INDEPENDENT_AMBULATORY_CARE_PROVIDER_SITE_OTHER): Admitting: Physician Assistant

## 2023-11-29 ENCOUNTER — Encounter: Payer: Self-pay | Admitting: Family Medicine

## 2023-11-29 VITALS — BP 124/70 | HR 64 | Ht 66.0 in | Wt 159.4 lb

## 2023-11-29 DIAGNOSIS — M25512 Pain in left shoulder: Secondary | ICD-10-CM

## 2023-11-29 DIAGNOSIS — M12812 Other specific arthropathies, not elsewhere classified, left shoulder: Secondary | ICD-10-CM

## 2023-11-29 NOTE — Patient Instructions (Signed)
 Good to see you. See me again in November right before your wedding.

## 2023-11-29 NOTE — Assessment & Plan Note (Signed)
 Chronic problem with exacerbation again.  Patient is tolerating these injections relatively well.  The last 1 for the shoulder was quite sometime ago.  Hopeful that this will make some improvement for multiple months.  Patient is getting married in the near future.  Discussed with patient about icing regimen and home exercises.  Discussed avoiding certain activities that could cause this irritation.  If no significant improvement will do the acromioclavicular joint as well.  Follow-up again 6 to 8 weeks otherwise.

## 2023-12-04 ENCOUNTER — Encounter: Payer: Self-pay | Admitting: Family Medicine

## 2023-12-04 ENCOUNTER — Other Ambulatory Visit: Payer: Self-pay

## 2023-12-04 MED ORDER — GABAPENTIN 100 MG PO CAPS: 200.0000 mg | ORAL_CAPSULE | Freq: Every day | ORAL | 0 refills | Status: AC

## 2023-12-10 ENCOUNTER — Ambulatory Visit: Admitting: Family Medicine

## 2023-12-10 DIAGNOSIS — R002 Palpitations: Secondary | ICD-10-CM | POA: Diagnosis not present

## 2023-12-11 ENCOUNTER — Ambulatory Visit: Payer: Self-pay | Admitting: Cardiovascular Disease

## 2023-12-11 DIAGNOSIS — R002 Palpitations: Secondary | ICD-10-CM

## 2023-12-12 ENCOUNTER — Ambulatory Visit (INDEPENDENT_AMBULATORY_CARE_PROVIDER_SITE_OTHER): Admitting: Audiology

## 2023-12-19 ENCOUNTER — Encounter: Payer: Self-pay | Admitting: Family Medicine

## 2023-12-20 ENCOUNTER — Other Ambulatory Visit: Payer: Self-pay

## 2023-12-20 MED ORDER — DULOXETINE HCL 20 MG PO CPEP: 40.0000 mg | ORAL_CAPSULE | Freq: Every day | ORAL | 0 refills | Status: AC

## 2023-12-25 ENCOUNTER — Ambulatory Visit (HOSPITAL_COMMUNITY)
Admission: RE | Admit: 2023-12-25 | Discharge: 2023-12-25 | Disposition: A | Discharge status: Home / Self Care | Source: Ambulatory Visit | Attending: Gastroenterology | Admitting: Gastroenterology

## 2023-12-25 DIAGNOSIS — I129 Hypertensive chronic kidney disease with stage 1 through stage 4 chronic kidney disease, or unspecified chronic kidney disease: Secondary | ICD-10-CM | POA: Diagnosis not present

## 2023-12-25 DIAGNOSIS — K5730 Diverticulosis of large intestine without perforation or abscess without bleeding: Secondary | ICD-10-CM | POA: Diagnosis not present

## 2023-12-25 DIAGNOSIS — K863 Pseudocyst of pancreas: Secondary | ICD-10-CM | POA: Diagnosis not present

## 2023-12-25 LAB — POCT I-STAT CREATININE: Creatinine, Ser: 0.8 mg/dL (ref 0.44–1.00)

## 2023-12-25 MED ORDER — SODIUM CHLORIDE (PF) 0.9 % IJ SOLN
INTRAMUSCULAR | Status: AC
  Filled 2023-12-25: qty 50

## 2023-12-25 MED ORDER — IOHEXOL 300 MG/ML  SOLN
100.0000 mL | Freq: Once | INTRAMUSCULAR | Status: AC | PRN
Start: 2023-12-25 — End: 2023-12-25
  Administered 2023-12-25: 100 mL via INTRAVENOUS

## 2023-12-26 DIAGNOSIS — H5319 Other subjective visual disturbances: Secondary | ICD-10-CM | POA: Diagnosis not present

## 2023-12-26 DIAGNOSIS — H5211 Myopia, right eye: Secondary | ICD-10-CM | POA: Diagnosis not present

## 2023-12-26 DIAGNOSIS — H524 Presbyopia: Secondary | ICD-10-CM | POA: Diagnosis not present

## 2023-12-26 DIAGNOSIS — H04123 Dry eye syndrome of bilateral lacrimal glands: Secondary | ICD-10-CM | POA: Diagnosis not present

## 2023-12-26 DIAGNOSIS — H5202 Hypermetropia, left eye: Secondary | ICD-10-CM | POA: Diagnosis not present

## 2023-12-26 DIAGNOSIS — H0016 Chalazion left eye, unspecified eyelid: Secondary | ICD-10-CM | POA: Diagnosis not present

## 2023-12-26 DIAGNOSIS — Z961 Presence of intraocular lens: Secondary | ICD-10-CM | POA: Diagnosis not present

## 2023-12-26 DIAGNOSIS — H52223 Regular astigmatism, bilateral: Secondary | ICD-10-CM | POA: Diagnosis not present

## 2024-01-02 ENCOUNTER — Ambulatory Visit: Payer: Self-pay | Admitting: Gastroenterology

## 2024-01-17 NOTE — Progress Notes (Deleted)
 [facility] [address] [place] [address] Phone: [phone] Subjective:    I'm seeing this patient by the request  of:  Jason Leita Repine, FNP (Inactive)  CC: left shoulder pain follow up   YEP:Dlagzrupcz  11/29/2023 Chronic problem with exacerbation again.  Patient is tolerating these injections relatively well.  The last 1 for the shoulder was quite sometime ago.  Hopeful that this will make some improvement for multiple months.  Patient is getting married in the near future.  Discussed with patient about icing regimen and home exercises.  Discussed avoiding certain activities that could cause this irritation.  If no significant improvement will do the acromioclavicular joint as well.  Follow-up again 6 to 8 weeks otherwise.      Update 01/21/2024 Abigail [facility] is a 73 y.o. female coming in with complaint of L shoulder pain. Patient states    Had been treated for a pseudocyst of the pancreas that is showing improvement with most recent imaging.  Due to have other imaging at the very end of the year.  Past Medical History:  Diagnosis Date   Allergy    Arthritis    back, hands   Basal cell carcinoma    face   Colon polyps    Complication of anesthesia    prolonged sedation, slow to wake up   Diplopia    Endometriosis    GERD (gastroesophageal reflux disease)    Headache    Migraines   Hyperlipidemia    Hypertension    Hypothyroidism    Pancreatitis    Skin cancer    hx of   Status post dilation of esophageal narrowing    Thalamic cyst    Thyroid  disease    Vertigo    Past Surgical History:  Procedure Laterality Date   ANTERIOR AND POSTERIOR REPAIR WITH SACROSPINOUS FIXATION N/A 07/08/2015   Procedure: ANTERIOR AND POSTERIOR REPAIR ;  Surgeon: Alm Cook, MD;  Location: [facility];  Service: Gynecology;  Laterality: N/A;   COLONOSCOPY     DIAGNOSTIC LAPAROSCOPY     LAPAROSCOPIC VAGINAL HYSTERECTOMY WITH SALPINGO  OOPHORECTOMY Bilateral 07/08/2015   Procedure: LAPAROSCOPIC ASSISTED VAGINAL HYSTERECTOMY WITH BILATERAL SALPINGO OOPHORECTOMY;  Surgeon: Alm Cook, MD;  Location: [facility];  Service: Gynecology;  Laterality: Bilateral;   OOPHORECTOMY  age 89   for endometrosis   Social History   Socioeconomic History   Marital status: Widowed    Spouse name: Not on file   Number of children: 0   Years of education: Not on file   Highest education level: Bachelor's degree (e.g., BA, AB, BS)  Occupational History    Comment: retired  Tobacco Use   Smoking status: Never   Smokeless tobacco: Never  Vaping Use   Vaping status: Never Used  Substance and Sexual Activity   Alcohol  use: Not Currently   Drug use: No   Sexual activity: Not Currently  Other Topics Concern   Not on file  Social History Narrative   Lives alone   Social Drivers of Health   Financial Resource Strain: Not on file  Food Insecurity: No Food Insecurity (10/03/2023)   Hunger Vital Sign    Worried About Running Out of Food in the Last Year: Never true    Ran Out of Food in the Last Year: Never true  Transportation Needs: No Transportation Needs (10/03/2023)   PRAPARE - Transportation    Lack of Transportation (Medical): No    Lack of  Transportation (Non-Medical): No  Physical Activity: Not on file  Stress: Not on file  Social Connections: Unknown (10/03/2023)   Social Connection and Isolation Panel    Frequency of Communication with Friends and Family: Three times a week    Frequency of Social Gatherings with Friends and Family: Three times a week    Attends Religious Services: More than 4 times per year    Active Member of Clubs or Organizations: Yes    Attends Engineer, Structural: More than 4 times per year    Marital Status: Patient declined   Allergies  Allergen Reactions   Demerol [Meperidine] Nausea Only and Other (See Comments)    Reaction=headache   Gadolinium Dermatitis   Family History  Problem  Relation Age of Onset   Liver cancer Mother    Kidney cancer Mother    Lung cancer Mother    Heart disease Father    Hypertension Father    Cirrhosis Father    Macular degeneration Father    Diabetes Brother    Heart attack Brother    Breast cancer Maternal Grandmother    Stroke Maternal Grandmother    Colon cancer Neg Hx    Esophageal cancer Neg Hx    Stomach cancer Neg Hx     Current Outpatient Medications (Endocrine & Metabolic):    levothyroxine  (SYNTHROID ) 75 MCG tablet, Take 75 mcg by mouth daily before breakfast.  Current Outpatient Medications (Cardiovascular):    atenolol  (TENORMIN ) 25 MG tablet, Take 25 mg by mouth daily.   hydrochlorothiazide  (MICROZIDE ) 12.5 MG capsule, Take 1 capsule (12.5 mg total) by mouth daily.   Current Outpatient Medications (Analgesics):    acetaminophen  (TYLENOL ) 500 MG tablet, Take 1,000 mg by mouth 2 (two) times daily as needed for moderate pain or headache.   Current Outpatient Medications (Hematological):    vitamin B-12 (CYANOCOBALAMIN ) 1000 MCG tablet, Take 1,000 mcg by mouth daily.  Current Outpatient Medications (Other):    ascorbic acid (VITAMIN C) 1000 MG tablet, Take 1,000 mg by mouth daily.   Calcium  Carb-Cholecalciferol 500-600 MG-UNIT TABS, Take 1 tablet by mouth daily.   DULoxetine  (CYMBALTA ) 20 MG capsule, Take 2 capsules (40 mg total) by mouth daily.   gabapentin  (NEURONTIN ) 100 MG capsule, Take 2 capsules (200 mg total) by mouth at bedtime.   Omega-3 Fatty Acids (FISH OIL) 1000 MG CAPS, Take 1,000 mg by mouth daily.   omeprazole  (PRILOSEC) 20 MG capsule, Take 1 capsule (20 mg total) by mouth 2 (two) times daily.   potassium chloride  SA (KLOR-CON  M) 20 MEQ tablet, Take 1 tablet (20 mEq total) by mouth daily.   vitamin k  100 MCG tablet, Take 1 tablet (100 mcg total) by mouth daily.   Vitamins A & D (VITAMIN A & D) 5000-400 UNITS CAPS, Take 1 capsule by mouth daily.   Reviewed prior external information including notes  and imaging from  primary care provider As well as notes that were available from care everywhere and other healthcare systems.  Past medical history, social, surgical and family history all reviewed in electronic medical record.  No pertanent information unless stated regarding to the chief complaint.   Review of Systems:  No headache, visual changes, nausea, vomiting, diarrhea, constipation, dizziness,  skin rash, fevers, chills, night sweats, weight loss, swollen lymph nodes, body aches, joint swelling, chest pain, shortness of breath, mood changes. POSITIVE muscle aches, mild abdominal pain  Objective  There were no vitals taken for this visit.   General: No  apparent distress alert and oriented x3 mood and affect normal, dressed appropriately.  HEENT: Pupils equal, extraocular movements intact  Respiratory: Patient's speak in full sentences and does not appear short of breath  Cardiovascular: No lower extremity edema, non tender, no erythema      Impression and Recommendations:     The above documentation has been reviewed and is accurate and complete Celene Pippins M Armilda Vanderlinden, DO

## 2024-01-21 ENCOUNTER — Ambulatory Visit: Admitting: Family Medicine

## 2024-01-23 ENCOUNTER — Other Ambulatory Visit: Payer: Self-pay | Admitting: Family Medicine

## 2024-01-23 MED ORDER — LEVOTHYROXINE SODIUM 75 MCG PO TABS: 75.0000 ug | ORAL_TABLET | Freq: Every day | ORAL | 1 refills | Status: AC

## 2024-01-23 NOTE — Telephone Encounter (Unsigned)
 Copied from CRM [id number]. Topic: Clinical - Medication Refill >> Jan 23, 2024  9:20 AM Jakyia R wrote: Medication:  levothyroxine  (SYNTHROID ) 75 MCG tablet   Has the patient contacted their pharmacy? Yes (Agent: If no, request that the patient contact the pharmacy for the refill. If patient does not wish to contact the pharmacy document the reason why and proceed with request.) (Agent: If yes, when and what did the pharmacy advise?)  This is the patient's preferred pharmacy:  [facility] [address], [place] - [address] [facility] [place] [address] Phone: [phone] Fax: [phone]  Is this the correct pharmacy for this prescription? Yes If no, delete pharmacy and type the correct one.   Has the prescription been filled recently? No  Is the patient out of the medication? No, but has about 8 days left, Pt also getting married and going on a honey mood from the 14th and wont have any then. Pt is scheduled for TOC in Feb.   Has the patient been seen for an appointment in the last year OR does the patient have an upcoming appointment? Yes  Can we respond through MyChart? Yes  Agent: Please be advised that Rx refills may take up to 3 business days. We ask that you follow-up with your pharmacy.   ----------------------------------------------------------------------- From previous Reason for Contact - Scheduling: Patient/patient representative is calling to schedule an appointment. Refer to attachments for appointment information.

## 2024-02-26 ENCOUNTER — Telehealth: Payer: Self-pay | Admitting: *Deleted

## 2024-02-26 DIAGNOSIS — K863 Pseudocyst of pancreas: Secondary | ICD-10-CM

## 2024-02-26 NOTE — Telephone Encounter (Signed)
 CT scan scheduled for 03/17/24 @ 1:30 pm. Patient needs labs. Left message for patient to call office.

## 2024-02-26 NOTE — Telephone Encounter (Signed)
-----   Message from [facility] sent at 01/02/2024  9:28 AM EDT ----- Regarding: schedule CT scan Patient needs CT scan for pancreas psudocyst for week after Christmas.

## 2024-02-28 ENCOUNTER — Ambulatory Visit

## 2024-02-28 VITALS — Ht 66.0 in | Wt 159.0 lb

## 2024-02-28 DIAGNOSIS — Z0000 Encounter for general adult medical examination without abnormal findings: Secondary | ICD-10-CM | POA: Diagnosis not present

## 2024-02-28 NOTE — Patient Instructions (Addendum)
 Abigail [facility],  Thank you for taking the time for your Medicare Wellness Visit. I appreciate your continued commitment to your health goals. Please review the care plan we discussed, and feel free to reach out if I can assist you further.  Please note that Annual Wellness Visits do not include a physical exam. Some assessments may be limited, especially if the visit was conducted virtually. If needed, we may recommend an in-person follow-up with your provider.  Ongoing Care Seeing your primary care provider every 3 to 6 months helps us  monitor your health and provide consistent, personalized care.   Referrals If a referral was made during today's visit and you haven't received any updates within two weeks, please contact the referred provider directly to check on the status.  Recommended Screenings:  Health Maintenance  Topic Date Due   Hepatitis C Screening  Never done   DTaP/Tdap/Td vaccine (1 - Tdap) Never done   Pneumococcal Vaccine for age over 84 (1 of 1 - PCV) Never done   Zoster (Shingles) Vaccine (1 of 2) Never done   Flu Shot  10/19/2023   COVID-19 Vaccine (1 - 2025-26 season) Never done   Medicare Annual Wellness Visit  02/27/2025   Breast Cancer Screening  05/14/2025   Osteoporosis screening with Bone Density Scan  Completed   Meningitis B Vaccine  Aged Out   Colon Cancer Screening  Discontinued       02/28/2024   12:44 PM  Advanced Directives  Does Patient Have a Medical Advance Directive? No  Would patient like information on creating a medical advance directive? No - Patient declined    Vision: Annual vision screenings are recommended for early detection of glaucoma, cataracts, and diabetic retinopathy. These exams can also reveal signs of chronic conditions such as diabetes and high blood pressure.  Dental: Annual dental screenings help detect early signs of oral cancer, gum disease, and other conditions linked to overall health, including heart disease and  diabetes.  Please see the attached documents for additional preventive care recommendations.

## 2024-02-28 NOTE — Progress Notes (Addendum)
 Chief Complaint  Patient presents with   Medicare Wellness     Subjective:   Abigail [facility] is a 73 y.o. female who presents for a Medicare Annual Wellness Visit.  Visit info / Clinical Intake: Medicare Wellness Visit Type:: Subsequent Annual Wellness Visit Persons participating in visit and providing information:: patient Medicare Wellness Visit Mode:: Telephone If telephone:: video declined Since this visit was completed virtually, some vitals may be partially provided or unavailable. Missing vitals are due to the limitations of the virtual format.: Documented vitals are patient reported If Telephone or Video please confirm:: I connected with patient using audio/video enable telemedicine. I verified patient identity with two identifiers, discussed telehealth limitations, and patient agreed to proceed. Patient Location:: Home Provider Location:: Office Interpreter Needed?: No Pre-visit prep was completed: yes AWV questionnaire completed by patient prior to visit?: no Living arrangements:: lives with spouse/significant other Patient's Overall Health Status Rating: (!) fair Typical amount of pain: some Does pain affect daily life?: no Are you currently prescribed opioids?: no  Dietary Habits and Nutritional Risks How many meals a day?: 3 Eats fruit and vegetables daily?: yes Most meals are obtained by: preparing own meals In the last 2 weeks, have you had any of the following?: none Diabetic:: no  Functional Status Activities of Daily Living (to include ambulation/medication): Independent Ambulation: Independent with device- listed below Home Assistive Devices/Equipment: Eyeglasses; Other (Comment) (Hearing Aids) Medication Administration: Independent Home Management (perform basic housework or laundry): Independent Manage your own finances?: yes Primary transportation is: driving Concerns about vision?: no *vision screening is required for WTM* Concerns about hearing?:  (!) yes Uses hearing aids?: (!) yes Hear whispered voice?: (!) no *in-person visit only*  Fall Screening Falls in the past year?: 1 Number of falls in past year: 0 Was there an injury with Fall?: 1 (Head injury followed bymedical attention) Fall Risk Category Calculator: 2 Patient Fall Risk Level: Moderate Fall Risk  Fall Risk Patient at Risk for Falls Due to: Other (Comment) (Fell off Tractor) Fall risk Follow up: Falls evaluation completed  Home and Transportation Safety: All rugs have non-skid backing?: yes All stairs or steps have railings?: yes Grab bars in the bathtub or shower?: yes Have non-skid surface in bathtub or shower?: yes Good home lighting?: yes Regular seat belt use?: yes Hospital stays in the last year:: (!) yes How many hospital stays:: 1 Reason: Fall eval  Cognitive Assessment Difficulty concentrating, remembering, or making decisions? : no Will 6CIT or Mini Cog be Completed: yes What year is it?: 0 points What month is it?: 0 points Give patient an address phrase to remember (5 components): [address] [place]  About what time is it?: 0 points Count backwards from 20 to 1: 0 points Say the months of the year in reverse: 0 points Repeat the address phrase from earlier: 0 points 6 CIT Score: 0 points  Advance Directives (For Healthcare) Does Patient Have a Medical Advance Directive?: No Would patient like information on creating a medical advance directive?: No - Patient declined  Reviewed/Updated  Reviewed/Updated: Reviewed All (Medical, Surgical, Family, Medications, Allergies, Care Teams, Patient Goals)    Allergies (verified) Demerol [meperidine] and Gadolinium   Current Medications (verified) Outpatient Encounter Medications as of 02/28/2024  Medication Sig   acetaminophen  (TYLENOL ) 500 MG tablet Take 1,000 mg by mouth 2 (two) times daily as needed for moderate pain or headache.    ascorbic acid (VITAMIN C) 1000 MG tablet Take  1,000 mg by mouth  daily.   atenolol  (TENORMIN ) 25 MG tablet Take 25 mg by mouth daily.   Calcium  Carb-Cholecalciferol 500-600 MG-UNIT TABS Take 1 tablet by mouth daily.   DULoxetine  (CYMBALTA ) 20 MG capsule Take 2 capsules (40 mg total) by mouth daily.   gabapentin  (NEURONTIN ) 100 MG capsule Take 2 capsules (200 mg total) by mouth at bedtime.   hydrochlorothiazide  (MICROZIDE ) 12.5 MG capsule Take 1 capsule (12.5 mg total) by mouth daily.   levothyroxine  (SYNTHROID ) 75 MCG tablet Take 1 tablet (75 mcg total) by mouth daily before breakfast.   Omega-3 Fatty Acids (FISH OIL) 1000 MG CAPS Take 1,000 mg by mouth daily.   omeprazole  (PRILOSEC) 20 MG capsule Take 1 capsule (20 mg total) by mouth 2 (two) times daily.   potassium chloride  SA (KLOR-CON  M) 20 MEQ tablet Take 1 tablet (20 mEq total) by mouth daily.   vitamin B-12 (CYANOCOBALAMIN ) 1000 MCG tablet Take 1,000 mcg by mouth daily.   vitamin k  100 MCG tablet Take 1 tablet (100 mcg total) by mouth daily.   Vitamins A & D (VITAMIN A & D) 5000-400 UNITS CAPS Take 1 capsule by mouth daily.   No facility-administered encounter medications on file as of 02/28/2024.    History: Past Medical History:  Diagnosis Date   Allergy    Arthritis    back, hands   Basal cell carcinoma    face   Colon polyps    Complication of anesthesia    prolonged sedation, slow to wake up   Diplopia    Endometriosis    GERD (gastroesophageal reflux disease)    Headache    Migraines   Hyperlipidemia    Hypertension    Hypothyroidism    Pancreatitis    Skin cancer    hx of   Status post dilation of esophageal narrowing    Thalamic cyst    Thyroid  disease    Vertigo    Past Surgical History:  Procedure Laterality Date   ANTERIOR AND POSTERIOR REPAIR WITH SACROSPINOUS FIXATION N/A 07/08/2015   Procedure: ANTERIOR AND POSTERIOR REPAIR ;  Surgeon: Alm Cook, MD;  Location: [facility];  Service: Gynecology;  Laterality: N/A;   COLONOSCOPY     DIAGNOSTIC  LAPAROSCOPY     LAPAROSCOPIC VAGINAL HYSTERECTOMY WITH SALPINGO OOPHORECTOMY Bilateral 07/08/2015   Procedure: LAPAROSCOPIC ASSISTED VAGINAL HYSTERECTOMY WITH BILATERAL SALPINGO OOPHORECTOMY;  Surgeon: Alm Cook, MD;  Location: [facility];  Service: Gynecology;  Laterality: Bilateral;   OOPHORECTOMY  age 51   for endometrosis   Family History  Problem Relation Age of Onset   Liver cancer Mother    Kidney cancer Mother    Lung cancer Mother    Heart disease Father    Hypertension Father    Cirrhosis Father    Macular degeneration Father    Diabetes Brother    Heart attack Brother    Breast cancer Maternal Grandmother    Stroke Maternal Grandmother    Colon cancer Neg Hx    Esophageal cancer Neg Hx    Stomach cancer Neg Hx    Social History   Occupational History    Comment: retired  Tobacco Use   Smoking status: Never   Smokeless tobacco: Never  Vaping Use   Vaping status: Never Used  Substance and Sexual Activity   Alcohol  use: Not Currently   Drug use: No   Sexual activity: Not Currently   Tobacco Counseling Counseling given: No  SDOH Screenings   Food Insecurity: No Food Insecurity (02/28/2024)  Housing: Unknown (02/28/2024)  Transportation Needs: No Transportation Needs (02/28/2024)  Utilities: Not At Risk (02/28/2024)  Depression (PHQ2-9): Low Risk (02/28/2024)  Physical Activity: Inactive (02/28/2024)  Social Connections: Socially Integrated (02/28/2024)  Stress: No Stress Concern Present (02/28/2024)  Tobacco Use: Low Risk (02/28/2024)  Health Literacy: Adequate Health Literacy (02/28/2024)   See flowsheets for full screening details  Depression Screen PHQ 2 & 9 Depression Scale- Over the past 2 weeks, how often have you been bothered by any of the following problems? Little interest or pleasure in doing things: 0 Feeling down, depressed, or hopeless (PHQ Adolescent also includes...irritable): 0 PHQ-2 Total Score: 0     Goals Addressed              This Visit's Progress    Increase physical activity       Remain active.             Objective:    Today's Vitals   02/28/24 1244  Weight: 159 lb (72.1 kg)  Height: 5' 6 (1.676 m)   Body mass index is 25.66 kg/m.  Hearing/Vision screen Hearing Screening - Comments:: Wears Hearing Aids Vision Screening - Comments:: Wears rx glasses - up to date with routine eye exams with  Dr Coralee Immunizations and Health Maintenance Health Maintenance  Topic Date Due   Hepatitis C Screening  Never done   DTaP/Tdap/Td (1 - Tdap) Never done   Pneumococcal Vaccine: 50+ Years (1 of 1 - PCV) Never done   Zoster Vaccines- Shingrix (1 of 2) Never done   Influenza Vaccine  10/19/2023   COVID-19 Vaccine (1 - 2025-26 season) Never done   Medicare Annual Wellness (AWV)  02/27/2025   Mammogram  05/14/2025   Bone Density Scan  Completed   Meningococcal B Vaccine  Aged Out   Colonoscopy  Discontinued        Assessment/Plan:  This is a routine wellness examination for [place].  Patient Care Team: Almarie Waddell NOVAK, NP as PCP - General (Family Medicine)  I have personally reviewed and noted the following in the patients chart:   Medical and social history Use of alcohol , tobacco or illicit drugs  Current medications and supplements including opioid prescriptions. Functional ability and status Nutritional status Physical activity Advanced directives List of other physicians Hospitalizations, surgeries, and ER visits in previous 12 months Vitals Screenings to include cognitive, depression, and falls Referrals and appointments  No orders of the defined types were placed in this encounter.  In addition, I have reviewed and discussed with patient certain preventive protocols, quality metrics, and best practice recommendations. A written personalized care plan for preventive services as well as general preventive health recommendations were provided to patient.   Rojelio LELON Blush,  LPN   [id number]   Return in 53 weeks (on 03/05/2025).  After Visit Summary: (MyChart) Due to this being a telephonic visit, the after visit summary with patients personalized plan was offered to patient via MyChart   Nurse Notes: HM Addressed: Labs and vaccines deferred until next appt with provider.

## 2024-02-28 NOTE — Progress Notes (Unsigned)
 [facility] [address] [place] [address] Phone: [phone] Subjective:   Abigail [facility], am serving as a scribe for Dr. Arthea Claudene.  I'm seeing this patient by the request  of:  Abigail Waddell NOVAK, NP  CC: Thumb pain  YEP:Dlagzrupcz  11/29/2023 Chronic problem with exacerbation again. Patient is tolerating these injections relatively well. The last 1 for the shoulder was quite sometime ago. Hopeful that this will make some improvement for multiple months. Patient is getting married in the near future. Discussed with patient about icing regimen and home exercises. Discussed avoiding certain activities that could cause this irritation. If no significant improvement will do the acromioclavicular joint as well. Follow-up again 6 to 8 weeks otherwise.   Updated 02/29/2024 Abigail Robinson is a 73 y.o. female coming in with complaint of finger pain, has had some pain in the left thumb with CMC arthritis with last injection being in January 2025.  Patient states L shoulder is not doing the best. Thumb is also bothersome. Fell onto shoulder and hip on the L side last week.  Patient at last follow-up was also having left shoulder pain and given an injection in September.  This was in the glenohumeral joint  Patient is scheduled for CT abdomen and pelvis at the end of December to recheck a pseudocyst of the pancreas  Past Medical History:  Diagnosis Date   Allergy    Arthritis    back, hands   Basal cell carcinoma    face   Colon polyps    Complication of anesthesia    prolonged sedation, slow to wake up   Diplopia    Endometriosis    GERD (gastroesophageal reflux disease)    Headache    Migraines   Hyperlipidemia    Hypertension    Hypothyroidism    Pancreatitis    Skin cancer    hx of   Status post dilation of esophageal narrowing    Thalamic cyst    Thyroid  disease    Vertigo    Past Surgical History:  Procedure Laterality Date    ANTERIOR AND POSTERIOR REPAIR WITH SACROSPINOUS FIXATION N/A 07/08/2015   Procedure: ANTERIOR AND POSTERIOR REPAIR ;  Surgeon: Alm Cook, MD;  Location: [facility];  Service: Gynecology;  Laterality: N/A;   COLONOSCOPY     DIAGNOSTIC LAPAROSCOPY     LAPAROSCOPIC VAGINAL HYSTERECTOMY WITH SALPINGO OOPHORECTOMY Bilateral 07/08/2015   Procedure: LAPAROSCOPIC ASSISTED VAGINAL HYSTERECTOMY WITH BILATERAL SALPINGO OOPHORECTOMY;  Surgeon: Alm Cook, MD;  Location: [facility];  Service: Gynecology;  Laterality: Bilateral;   OOPHORECTOMY  age 20   for endometrosis   Social History   Socioeconomic History   Marital status: Widowed    Spouse name: Not on file   Number of children: 0   Years of education: Not on file   Highest education level: Bachelor's degree (e.g., BA, AB, BS)  Occupational History    Comment: retired  Tobacco Use   Smoking status: Never   Smokeless tobacco: Never  Vaping Use   Vaping status: Never Used  Substance and Sexual Activity   Alcohol  use: Not Currently   Drug use: No   Sexual activity: Not Currently  Other Topics Concern   Not on file  Social History Narrative   Lives alone   Social Drivers of Health   Tobacco Use: Low Risk (02/28/2024)   Patient History    Smoking Tobacco Use: Never    Smokeless Tobacco Use: Never  Passive Exposure: Not on file  Financial Resource Strain: Not on file  Food Insecurity: No Food Insecurity (02/28/2024)   Epic    Worried About Programme Researcher, Broadcasting/film/video in the Last Year: Never true    Ran Out of Food in the Last Year: Never true  Transportation Needs: No Transportation Needs (02/28/2024)   Epic    Lack of Transportation (Medical): No    Lack of Transportation (Non-Medical): No  Physical Activity: Inactive (02/28/2024)   Exercise Vital Sign    Days of Exercise per Week: 0 days    Minutes of Exercise per Session: 0 min  Stress: No Stress Concern Present (02/28/2024)   [employer] of Occupational Health - Occupational  Stress Questionnaire    Feeling of Stress: Not at all  Social Connections: Socially Integrated (02/28/2024)   Social Connection and Isolation Panel    Frequency of Communication with Friends and Family: More than three times a week    Frequency of Social Gatherings with Friends and Family: More than three times a week    Attends Religious Services: More than 4 times per year    Active Member of Clubs or Organizations: Yes    Attends Banker Meetings: More than 4 times per year    Marital Status: Married  Depression (PHQ2-9): Low Risk (02/28/2024)   Depression (PHQ2-9)    PHQ-2 Score: 0  Alcohol  Screen: Not on file  Housing: Unknown (02/28/2024)   Epic    Unable to Pay for Housing in the Last Year: No    Number of Times Moved in the Last Year: Not on file    Homeless in the Last Year: No  Utilities: Not At Risk (02/28/2024)   Epic    Threatened with loss of utilities: No  Health Literacy: Adequate Health Literacy (02/28/2024)   B1300 Health Literacy    Frequency of need for help with medical instructions: Never   Allergies[1] Family History  Problem Relation Age of Onset   Liver cancer Mother    Kidney cancer Mother    Lung cancer Mother    Heart disease Father    Hypertension Father    Cirrhosis Father    Macular degeneration Father    Diabetes Brother    Heart attack Brother    Breast cancer Maternal Grandmother    Stroke Maternal Grandmother    Colon cancer Neg Hx    Esophageal cancer Neg Hx    Stomach cancer Neg Hx     Current Outpatient Medications (Endocrine & Metabolic):    levothyroxine  (SYNTHROID ) 75 MCG tablet, Take 1 tablet (75 mcg total) by mouth daily before breakfast.  Current Outpatient Medications (Cardiovascular):    atenolol  (TENORMIN ) 25 MG tablet, Take 25 mg by mouth daily.   hydrochlorothiazide  (MICROZIDE ) 12.5 MG capsule, Take 1 capsule (12.5 mg total) by mouth daily.  Current Outpatient Medications (Analgesics):    acetaminophen   (TYLENOL ) 500 MG tablet, Take 1,000 mg by mouth 2 (two) times daily as needed for moderate pain or headache.   Current Outpatient Medications (Hematological):    vitamin B-12 (CYANOCOBALAMIN ) 1000 MCG tablet, Take 1,000 mcg by mouth daily.  Current Outpatient Medications (Other):    ascorbic acid (VITAMIN C) 1000 MG tablet, Take 1,000 mg by mouth daily.   Calcium  Carb-Cholecalciferol 500-600 MG-UNIT TABS, Take 1 tablet by mouth daily.   DULoxetine  (CYMBALTA ) 20 MG capsule, Take 2 capsules (40 mg total) by mouth daily.   gabapentin  (NEURONTIN ) 100 MG capsule, Take 2 capsules (200  mg total) by mouth at bedtime.   Omega-3 Fatty Acids (FISH OIL) 1000 MG CAPS, Take 1,000 mg by mouth daily.   omeprazole  (PRILOSEC) 20 MG capsule, Take 1 capsule (20 mg total) by mouth 2 (two) times daily.   potassium chloride  SA (KLOR-CON  M) 20 MEQ tablet, Take 1 tablet (20 mEq total) by mouth daily.   vitamin k  100 MCG tablet, Take 1 tablet (100 mcg total) by mouth daily.   Vitamins A & D (VITAMIN A & D) 5000-400 UNITS CAPS, Take 1 capsule by mouth daily.   Reviewed prior external information including notes and imaging from  primary care provider As well as notes that were available from care everywhere and other healthcare systems.  Past medical history, social, surgical and family history all reviewed in electronic medical record.  No pertanent information unless stated regarding to the chief complaint.   Review of Systems:  No headache, visual changes, nausea, vomiting, diarrhea, constipation, dizziness, abdominal pain, skin rash, fevers, chills, night sweats, weight loss, swollen lymph nodes, body aches, joint swelling, chest pain, shortness of breath, mood changes. POSITIVE muscle aches  Objective  Blood pressure 130/84, pulse 60, height 5' 6 (1.676 m), weight 161 lb (73 kg), SpO2 95%.   General: No apparent distress alert and oriented x3 mood and affect normal, dressed appropriately.  HEENT: Pupils  equal, extraocular movements intact  Respiratory: Patient's speak in full sentences and does not appear short of breath  Cardiovascular: No lower extremity edema, non tender, no erythema  Shoulder exam shows patient does have arthritic changes noted.  Some limited range of motion noted as well.  Thumb exam shows CMC arthritis noted.  Positive grind test noted.  Procedure: Real-time Ultrasound Guided Injection of left glenohumeral joint Device: GE Logiq E  Ultrasound guided injection is preferred based studies that show increased duration, increased effect, greater accuracy, decreased procedural pain, increased response rate with ultrasound guided versus blind injection.  Verbal informed consent obtained.  Time-out conducted.  Noted no overlying erythema, induration, or other signs of local infection.  Skin prepped in a sterile fashion.  Local anesthesia: Topical Ethyl chloride.  With sterile technique and under real time ultrasound guidance:  Joint visualized.  21g 2 inch needle inserted posterior approach. Pictures taken for needle placement. Patient did have injection of 2 cc of 0.5% Marcaine , and 1cc of Kenalog 40 mg/dL. Completed without difficulty  Pain immediately resolved suggesting accurate placement of the medication.  Advised to call if fevers/chills, erythema, induration, drainage, or persistent bleeding.  Images permanently stored and available for review in the ultrasound unit.  Impression: Technically successful ultrasound guided injection.  Procedure: Real-time Ultrasound Guided Injection of left acromioclavicular joint Device: GE Logiq Q7 Ultrasound guided injection is preferred based studies that show increased duration, increased effect, greater accuracy, decreased procedural pain, increased response rate, and decreased cost with ultrasound guided versus blind injection.  Verbal informed consent obtained.  Time-out conducted.  Noted no overlying erythema, induration, or  other signs of local infection.  Skin prepped in a sterile fashion.  Local anesthesia: Topical Ethyl chloride.  With sterile technique and under real time ultrasound guidance: With a 25-gauge half inch needle injected with 0.5 cc of 0.5% Marcaine  and 0.5 cc of Kenalog 40 mg/mL Completed without difficulty  Pain immediately resolved suggesting accurate placement of the medication.  Advised to call if fevers/chills, erythema, induration, drainage, or persistent bleeding.  Images saved Impression: Technically successful ultrasound guided injection.  Procedure: Real-time Ultrasound Guided Injection  of left CMC joint Device: GE Logiq Q7 Ultrasound guided injection is preferred based studies that show increased duration, increased effect, greater accuracy, decreased procedural pain, increased response rate, and decreased cost with ultrasound guided versus blind injection.  Verbal informed consent obtained.  Time-out conducted.  Noted no overlying erythema, induration, or other signs of local infection.  Skin prepped in a sterile fashion.  Local anesthesia: Topical Ethyl chloride.  With sterile technique and under real time ultrasound guidance: With a 25-gauge half inch needle injected with 0.5 cc of 0.5% Marcaine  and 0.5 cc of Kenalog 40 mg/mL Completed without difficulty  Pain immediately resolved suggesting accurate placement of the medication.  Advised to call if fevers/chills, erythema, induration, drainage, or persistent bleeding.  Impression: Technically successful ultrasound guided injection.    Impression and Recommendations:     The above documentation has been reviewed and is accurate and complete Arthea CHRISTELLA Sharps, DO       [1]  Allergies Allergen Reactions   Demerol [Meperidine] Nausea Only and Other (See Comments)    Reaction=headache   Gadolinium Dermatitis

## 2024-02-29 ENCOUNTER — Other Ambulatory Visit: Payer: Self-pay

## 2024-02-29 ENCOUNTER — Encounter: Payer: Self-pay | Admitting: Family Medicine

## 2024-02-29 ENCOUNTER — Ambulatory Visit: Admitting: Family Medicine

## 2024-02-29 VITALS — BP 130/84 | HR 60 | Ht 66.0 in | Wt 161.0 lb

## 2024-02-29 DIAGNOSIS — M19012 Primary osteoarthritis, left shoulder: Secondary | ICD-10-CM

## 2024-02-29 DIAGNOSIS — M1812 Unilateral primary osteoarthritis of first carpometacarpal joint, left hand: Secondary | ICD-10-CM

## 2024-02-29 DIAGNOSIS — M12812 Other specific arthropathies, not elsewhere classified, left shoulder: Secondary | ICD-10-CM

## 2024-02-29 NOTE — Assessment & Plan Note (Signed)
 Chronic problem as well.  Discussed potential bracing on a more regular basis.  Discussed home exercises, increase activity slowly.  Follow-up again in 6 to 12 weeks.

## 2024-02-29 NOTE — Assessment & Plan Note (Signed)
 Repeat injection given today.  Tolerated the procedure well and hopeful that this will make significant improvement as well.  We discussed home exercise increase activity slowly follow-up again in 6 to 12 weeks.

## 2024-02-29 NOTE — Assessment & Plan Note (Signed)
 Chronic problem with worsening symptoms.  Does have known arthritic changes.  Discussed icing regimen and home exercises, increase activity slowly.  Discussed icing regimen.  Follow-up again in 6 to 12 weeks

## 2024-02-29 NOTE — Patient Instructions (Addendum)
 Injection in shoulder and thumb today Good to see you! See you again in 6-8 weeks Try Gabapentin  Congrats

## 2024-03-03 NOTE — Telephone Encounter (Signed)
 Patient informed of CT scan and lab test needed. Sent instructions via Mychart as well.

## 2024-03-17 ENCOUNTER — Ambulatory Visit (HOSPITAL_COMMUNITY)
Admission: RE | Admit: 2024-03-17 | Discharge: 2024-03-17 | Disposition: A | Discharge status: Home / Self Care | Source: Ambulatory Visit | Attending: Gastroenterology | Admitting: Gastroenterology

## 2024-03-17 DIAGNOSIS — K863 Pseudocyst of pancreas: Secondary | ICD-10-CM | POA: Insufficient documentation

## 2024-03-17 MED ORDER — IOHEXOL 300 MG/ML  SOLN
100.0000 mL | Freq: Once | INTRAMUSCULAR | Status: AC | PRN
  Administered 2024-03-17: 100 mL via INTRAVENOUS

## 2024-03-17 MED ORDER — IOHEXOL 9 MG/ML PO SOLN
500.0000 mL | ORAL | Status: AC
  Administered 2024-03-17 (×2): 500 mL via ORAL

## 2024-03-31 ENCOUNTER — Ambulatory Visit: Payer: Self-pay | Admitting: Gastroenterology

## 2024-04-11 NOTE — Telephone Encounter (Signed)
 Left message for patient to call office.

## 2024-04-11 NOTE — Progress Notes (Unsigned)
 " [facility] [address] [place] [address] Phone: [phone] Subjective:    I'm seeing this patient by the request  of:  Abigail Waddell NOVAK, NP  CC:   YEP:Dlagzrupcz  02/29/2024 Chronic problem as well. Discussed potential bracing on a more regular basis. Discussed home exercises, increase activity slowly. Follow-up again in 6 to 12 weeks.   Repeat injection given today.  Tolerated the procedure well and hopeful that this will make significant improvement as well.  We discussed home exercise increase activity slowly follow-up again in 6 to 12 weeks.     Chronic problem with worsening symptoms.  Does have known arthritic changes.  Discussed icing regimen and home exercises, increase activity slowly.  Discussed icing regimen.  Follow-up again in 6 to 12 weeks     Update 04/15/2024 Abigail Robinson is a 74 y.o. female coming in with complaint of R shoulder, L CMC joint.      Past Medical History:  Diagnosis Date   Allergy    Arthritis    back, hands   Basal cell carcinoma    face   Colon polyps    Complication of anesthesia    prolonged sedation, slow to wake up   Diplopia    Endometriosis    GERD (gastroesophageal reflux disease)    Headache    Migraines   Hyperlipidemia    Hypertension    Hypothyroidism    Pancreatitis    Skin cancer    hx of   Status post dilation of esophageal narrowing    Thalamic cyst    Thyroid  disease    Vertigo    Past Surgical History:  Procedure Laterality Date   ANTERIOR AND POSTERIOR REPAIR WITH SACROSPINOUS FIXATION N/A 07/08/2015   Procedure: ANTERIOR AND POSTERIOR REPAIR ;  Surgeon: Abigail Cook, MD;  Location: [facility];  Service: Gynecology;  Laterality: N/A;   COLONOSCOPY     DIAGNOSTIC LAPAROSCOPY     LAPAROSCOPIC VAGINAL HYSTERECTOMY WITH SALPINGO OOPHORECTOMY Bilateral 07/08/2015   Procedure: LAPAROSCOPIC ASSISTED VAGINAL HYSTERECTOMY WITH BILATERAL SALPINGO OOPHORECTOMY;  Surgeon: Abigail Cook,  MD;  Location: [facility];  Service: Gynecology;  Laterality: Bilateral;   OOPHORECTOMY  age 88   for endometrosis   Social History   Socioeconomic History   Marital status: Widowed    Spouse name: Not on file   Number of children: 0   Years of education: Not on file   Highest education level: Bachelor's degree (e.g., BA, AB, BS)  Occupational History    Comment: retired  Tobacco Use   Smoking status: Never   Smokeless tobacco: Never  Vaping Use   Vaping status: Never Used  Substance and Sexual Activity   Alcohol  use: Not Currently   Drug use: No   Sexual activity: Not Currently  Other Topics Concern   Not on file  Social History Narrative   Lives alone   Social Drivers of Health   Tobacco Use: Low Risk (02/29/2024)   Patient History    Smoking Tobacco Use: Never    Smokeless Tobacco Use: Never    Passive Exposure: Not on file  Financial Resource Strain: Not on file  Food Insecurity: No Food Insecurity (02/28/2024)   Epic    Worried About Programme Researcher, Broadcasting/film/video in the Last Year: Never true    Ran Out of Food in the Last Year: Never true  Transportation Needs: No Transportation Needs (02/28/2024)   Epic    Lack of Transportation (  Medical): No    Lack of Transportation (Non-Medical): No  Physical Activity: Inactive (02/28/2024)   Exercise Vital Sign    Days of Exercise per Week: 0 days    Minutes of Exercise per Session: 0 min  Stress: No Stress Concern Present (02/28/2024)   Abigail Robinson    Feeling of Stress: Not at all  Social Connections: Socially Integrated (02/28/2024)   Social Connection and Isolation Panel    Frequency of Communication with Friends and Family: More than three times a week    Frequency of Social Gatherings with Friends and Family: More than three times a week    Attends Religious Services: More than 4 times per year    Active Member of Clubs or Organizations: Yes    Attends Tax Inspector Meetings: More than 4 times per year    Marital Status: Married  Depression (PHQ2-9): Low Risk (02/28/2024)   Depression (PHQ2-9)    PHQ-2 Score: 0  Alcohol  Screen: Not on file  Housing: Unknown (02/28/2024)   Epic    Unable to Pay for Housing in the Last Year: No    Number of Times Moved in the Last Year: Not on file    Homeless in the Last Year: No  Utilities: Not At Risk (02/28/2024)   Epic    Threatened with loss of utilities: No  Health Literacy: Adequate Health Literacy (02/28/2024)   B1300 Health Literacy    Frequency of need for help with medical instructions: Never   Allergies[1] Family History  Problem Relation Age of Onset   Liver cancer Mother    Kidney cancer Mother    Lung cancer Mother    Heart disease Father    Hypertension Father    Cirrhosis Father    Macular degeneration Father    Diabetes Brother    Heart attack Brother    Breast cancer Maternal Grandmother    Stroke Maternal Grandmother    Colon cancer Neg Hx    Esophageal cancer Neg Hx    Stomach cancer Neg Hx     Current Outpatient Medications (Endocrine & Metabolic):    levothyroxine  (SYNTHROID ) 75 MCG tablet, Take 1 tablet (75 mcg total) by mouth daily before breakfast.  Current Outpatient Medications (Cardiovascular):    atenolol  (TENORMIN ) 25 MG tablet, Take 25 mg by mouth daily.   hydrochlorothiazide  (MICROZIDE ) 12.5 MG capsule, Take 1 capsule (12.5 mg total) by mouth daily.  Current Outpatient Medications (Analgesics):    acetaminophen  (TYLENOL ) 500 MG tablet, Take 1,000 mg by mouth 2 (two) times daily as needed for moderate pain or headache.   Current Outpatient Medications (Hematological):    vitamin B-12 (CYANOCOBALAMIN ) 1000 MCG tablet, Take 1,000 mcg by mouth daily.  Current Outpatient Medications (Other):    ascorbic acid (VITAMIN C) 1000 MG tablet, Take 1,000 mg by mouth daily.   Calcium  Carb-Cholecalciferol 500-600 MG-UNIT TABS, Take 1 tablet by mouth daily.    DULoxetine  (CYMBALTA ) 20 MG capsule, Take 2 capsules (40 mg total) by mouth daily.   gabapentin  (NEURONTIN ) 100 MG capsule, Take 2 capsules (200 mg total) by mouth at bedtime.   Omega-3 Fatty Acids (FISH OIL) 1000 MG CAPS, Take 1,000 mg by mouth daily.   omeprazole  (PRILOSEC) 20 MG capsule, Take 1 capsule (20 mg total) by mouth 2 (two) times daily.   potassium chloride  SA (KLOR-CON  M) 20 MEQ tablet, Take 1 tablet (20 mEq total) by mouth daily.   vitamin k  100 MCG  tablet, Take 1 tablet (100 mcg total) by mouth daily.   Vitamins A & D (VITAMIN A & D) 5000-400 UNITS CAPS, Take 1 capsule by mouth daily.   Reviewed prior external information including notes and imaging from  primary care provider As well as notes that were available from care everywhere and other healthcare systems.  Past medical history, social, surgical and family history all reviewed in electronic medical record.  No pertanent information unless stated regarding to the chief complaint.   Review of Systems:  No headache, visual changes, nausea, vomiting, diarrhea, constipation, dizziness, abdominal pain, skin rash, fevers, chills, night sweats, weight loss, swollen lymph nodes, body aches, joint swelling, chest pain, shortness of breath, mood changes. POSITIVE muscle aches  Objective  There were no vitals taken for this visit.   General: No apparent distress alert and oriented x3 mood and affect normal, dressed appropriately.  HEENT: Pupils equal, extraocular movements intact  Respiratory: Patient's speak in full sentences and does not appear short of breath  Cardiovascular: No lower extremity edema, non tender, no erythema      Impression and Recommendations:           [1]  Allergies Allergen Reactions   Demerol [Meperidine] Nausea Only and Other (See Comments)    Reaction=headache   Gadolinium Dermatitis   "

## 2024-04-15 ENCOUNTER — Ambulatory Visit: Admitting: Family Medicine

## 2024-05-01 ENCOUNTER — Ambulatory Visit: Admitting: Family Medicine

## 2024-05-05 ENCOUNTER — Encounter: Admitting: Family Medicine

## 2025-03-05 ENCOUNTER — Ambulatory Visit
